# Patient Record
Sex: Female | Born: 1995 | Hispanic: Yes | Marital: Single | Smoking: Never smoker
Health system: Southern US, Community
[De-identification: ages and names within clinical notes are randomized; demographics above are authoritative.]

## PROBLEM LIST (undated history)

## (undated) ENCOUNTER — Emergency Department (HOSPITAL_COMMUNITY): Payer: Self-pay

## (undated) ENCOUNTER — Inpatient Hospital Stay (HOSPITAL_COMMUNITY): Payer: Self-pay

## (undated) DIAGNOSIS — K219 Gastro-esophageal reflux disease without esophagitis: Secondary | ICD-10-CM

## (undated) DIAGNOSIS — O24419 Gestational diabetes mellitus in pregnancy, unspecified control: Secondary | ICD-10-CM

## (undated) DIAGNOSIS — R519 Headache, unspecified: Secondary | ICD-10-CM

## (undated) DIAGNOSIS — Z789 Other specified health status: Secondary | ICD-10-CM

## (undated) HISTORY — PX: NO PAST SURGERIES: SHX2092

## (undated) HISTORY — PX: WISDOM TOOTH EXTRACTION: SHX21

---
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
## 2017-12-22 DIAGNOSIS — O020 Blighted ovum and nonhydatidiform mole: Secondary | ICD-10-CM

## 2017-12-22 HISTORY — DX: Blighted ovum and nonhydatidiform mole: O02.0

## 2017-12-26 ENCOUNTER — Inpatient Hospital Stay (HOSPITAL_COMMUNITY)
Admission: AD | Admit: 2017-12-26 | Discharge: 2017-12-27 | Disposition: A | Discharge status: Home / Self Care | Payer: Self-pay | Source: Ambulatory Visit | Attending: Obstetrics and Gynecology | Admitting: Obstetrics and Gynecology

## 2017-12-26 ENCOUNTER — Other Ambulatory Visit: Payer: Self-pay

## 2017-12-26 ENCOUNTER — Encounter (HOSPITAL_COMMUNITY): Payer: Self-pay | Admitting: *Deleted

## 2017-12-26 ENCOUNTER — Inpatient Hospital Stay (HOSPITAL_COMMUNITY)
Admission: AD | Admit: 2017-12-26 | Discharge: 2017-12-26 | Discharge status: Absent without leave | Payer: Self-pay | Attending: Obstetrics and Gynecology | Admitting: Obstetrics and Gynecology

## 2017-12-26 ENCOUNTER — Inpatient Hospital Stay (HOSPITAL_COMMUNITY): Payer: Self-pay

## 2017-12-26 DIAGNOSIS — O020 Blighted ovum and nonhydatidiform mole: Secondary | ICD-10-CM | POA: Insufficient documentation

## 2017-12-26 DIAGNOSIS — O34539 Maternal care for retroversion of gravid uterus, unspecified trimester: Secondary | ICD-10-CM | POA: Insufficient documentation

## 2017-12-26 DIAGNOSIS — O34599 Maternal care for other abnormalities of gravid uterus, unspecified trimester: Secondary | ICD-10-CM | POA: Insufficient documentation

## 2017-12-26 DIAGNOSIS — D4959 Neoplasm of unspecified behavior of other genitourinary organ: Secondary | ICD-10-CM | POA: Insufficient documentation

## 2017-12-26 DIAGNOSIS — O26891 Other specified pregnancy related conditions, first trimester: Secondary | ICD-10-CM | POA: Insufficient documentation

## 2017-12-26 DIAGNOSIS — O3680X0 Pregnancy with inconclusive fetal viability, not applicable or unspecified: Secondary | ICD-10-CM | POA: Insufficient documentation

## 2017-12-26 DIAGNOSIS — N854 Malposition of uterus: Secondary | ICD-10-CM | POA: Insufficient documentation

## 2017-12-26 DIAGNOSIS — R109 Unspecified abdominal pain: Secondary | ICD-10-CM | POA: Insufficient documentation

## 2017-12-26 HISTORY — DX: Other specified health status: Z78.9

## 2017-12-26 LAB — URINALYSIS, ROUTINE W REFLEX MICROSCOPIC
Bilirubin Urine: NEGATIVE
GLUCOSE, UA: NEGATIVE mg/dL
Hgb urine dipstick: NEGATIVE
KETONES UR: NEGATIVE mg/dL
Leukocytes, UA: NEGATIVE
NITRITE: NEGATIVE
PROTEIN: NEGATIVE mg/dL
Specific Gravity, Urine: 1.025 (ref 1.005–1.030)
pH: 5 (ref 5.0–8.0)

## 2017-12-26 LAB — POCT PREGNANCY, URINE: Preg Test, Ur: POSITIVE — AB

## 2017-12-26 NOTE — MAU Provider Note (Signed)
History   G1 @ 10 wks per LMP in with c/o abd pain. States was a pregnancy center yesterday and was told they could not see pregnancy or her uterus. Pt presents today with abd pain that is constant in nature and more to her left side. Denies vag bleeding.  CSN: [id number]  Arrival date & time 12/26/17  2014   None     Chief Complaint  Patient presents with  . Abdominal Pain    HPI  Past Medical History:  Diagnosis Date  . Medical history non-contributory     Past Surgical History:  Procedure Laterality Date  . NO PAST SURGERIES      No family history on file.  Social History   Tobacco Use  . Smoking status: Never Smoker  . Smokeless tobacco: Never Used  Substance Use Topics  . Alcohol use: No  . Drug use: No    OB History    Gravida Para Term Preterm AB Living   1             SAB TAB Ectopic Multiple Live Births                  Review of Systems  Constitutional: Negative.   HENT: Negative.   Eyes: Negative.   Respiratory: Negative.   Cardiovascular: Negative.   Gastrointestinal: Positive for abdominal pain.  Endocrine: Negative.   Genitourinary: Negative.   Musculoskeletal: Negative.   Skin: Negative.   Allergic/Immunologic: Negative.   Neurological: Negative.   Hematological: Negative.   Psychiatric/Behavioral: Negative.     Allergies  Patient has no known allergies.  Home Medications    BP 138/67 (BP Location: Right Arm)   Pulse 88   Temp 98.2 F (36.8 C)   Resp 18   Ht 5\' 1"  (1.549 m)   Wt 146 lb (66.2 kg)   LMP 10/11/2017   BMI 27.59 kg/m   Physical Exam  Constitutional: She is oriented to person, place, and time. She appears well-developed and well-nourished.  HENT:  Head: Normocephalic.  Eyes: Pupils are equal, round, and reactive to light.  Neck: Normal range of motion.  Cardiovascular: Normal rate, regular rhythm, normal heart sounds and intact distal pulses.  Pulmonary/Chest: Effort normal and breath sounds normal.   Abdominal: Soft. Bowel sounds are normal.  Genitourinary: Vagina normal and uterus normal.  Musculoskeletal: Normal range of motion.  Neurological: She is alert and oriented to person, place, and time. She has normal reflexes.  Skin: Skin is warm and dry.  Psychiatric: She has a normal mood and affect. Her behavior is normal. Judgment and thought content normal.    MAU Course  Procedures (including critical care time)  Labs Reviewed  URINALYSIS, ROUTINE W REFLEX MICROSCOPIC  HCG, QUANTITATIVE, PREGNANCY  POCT PREGNANCY, URINE   US Ob Comp Less 14 Wks  Result Date: 12/26/2017 CLINICAL DATA:  Pelvic pain. No gestational sac seen on outside ultrasound. Quantitative beta HCG is pending. EXAM: OBSTETRIC <14 WK [place] AND TRANSVAGINAL OB US TECHNIQUE: Both transabdominal and transvaginal ultrasound examinations were performed for complete evaluation of the gestation as well as the maternal uterus, adnexal regions, and pelvic cul-de-sac. Transvaginal technique was performed to assess early pregnancy. COMPARISON:  None. FINDINGS: Intrauterine gestational sac: None Yolk sac:  Not Visualized. Embryo:  Not Visualized. Cardiac Activity: Not Visualized. Maternal uterus/adnexae: Uterus is somewhat retroverted. Endometrium is expanded and there is heterogeneous multi cystic and hyperechoic uterine structure with peripheral flow. Appearances likely indicate molar pregnancy. Both ovaries  are demonstrated and are normal in appearance. Corpus luteal cyst on the left. No free fluid in the pelvis. IMPRESSION: A normal intrauterine pregnancy is not identified. The endometrium is expanded with heterogeneous multi-cystic in hyperechoic mass likely representing a molar pregnancy. Electronically Signed   By: Lucienne Capers M.D.   On: 12/26/2017 22:47   US Ob Transvaginal  Result Date: 12/26/2017 CLINICAL DATA:  Pelvic pain. No gestational sac seen on outside ultrasound. Quantitative beta HCG is pending. EXAM: OBSTETRIC  <14 WK [place] AND TRANSVAGINAL OB US TECHNIQUE: Both transabdominal and transvaginal ultrasound examinations were performed for complete evaluation of the gestation as well as the maternal uterus, adnexal regions, and pelvic cul-de-sac. Transvaginal technique was performed to assess early pregnancy. COMPARISON:  None. FINDINGS: Intrauterine gestational sac: None Yolk sac:  Not Visualized. Embryo:  Not Visualized. Cardiac Activity: Not Visualized. Maternal uterus/adnexae: Uterus is somewhat retroverted. Endometrium is expanded and there is heterogeneous multi cystic and hyperechoic uterine structure with peripheral flow. Appearances likely indicate molar pregnancy. Both ovaries are demonstrated and are normal in appearance. Corpus luteal cyst on the left. No free fluid in the pelvis. IMPRESSION: A normal intrauterine pregnancy is not identified. The endometrium is expanded with heterogeneous multi-cystic in hyperechoic mass likely representing a molar pregnancy. Electronically Signed   By: Lucienne Capers M.D.   On: 12/26/2017 22:47    Results for orders placed or performed during the hospital encounter of 12/26/17 (from the past 48 hour(s))  Urinalysis, Routine w reflex microscopic     Status: None   Collection Time: 12/26/17  8:40 PM  Result Value Ref Range   Color, Urine YELLOW YELLOW   APPearance CLEAR CLEAR   Specific Gravity, Urine 1.025 1.005 - 1.030   pH 5.0 5.0 - 8.0   Glucose, UA NEGATIVE NEGATIVE mg/dL   Hgb urine dipstick NEGATIVE NEGATIVE   Bilirubin Urine NEGATIVE NEGATIVE   Ketones, ur NEGATIVE NEGATIVE mg/dL   Protein, ur NEGATIVE NEGATIVE mg/dL   Nitrite NEGATIVE NEGATIVE   Leukocytes, UA NEGATIVE NEGATIVE  Pregnancy, urine POC     Status: Abnormal   Collection Time: 12/26/17  9:05 PM  Result Value Ref Range   Preg Test, Ur POSITIVE (A) NEGATIVE    Comment:        THE SENSITIVITY OF THIS METHODOLOGY IS >24 mIU/mL   hCG, quantitative, pregnancy     Status: Abnormal   Collection  Time: 12/26/17  9:06 PM  Result Value Ref Range   hCG, Beta Chain, Quant, S 416,946 (H) <5 mIU/mL    Comment:          GEST. AGE      CONC.  (mIU/mL)   <=1 WEEK        5 - 50     2 WEEKS       50 - 500     3 WEEKS       100 - 10,000     4 WEEKS     1,000 - 30,000     5 WEEKS     3,500 - 115,000   6-8 WEEKS     12,000 - 270,000    12 WEEKS     15,000 - 220,000        FEMALE AND NON-PREGNANT FEMALE:     LESS THAN 5 mIU/mL RESULTS CONFIRMED BY MANUAL DILUTION     MDM  VSS, abd soft non tender, no abnormal discharge, no vag bleeding. POC preg test pos.   Received report  from Daiva Nakayama @ 9:30 PM Patient in [place] Discussed [place] in detail with patient and significant other. Patient speaks english, however interpretor used for significant other. Discussed [place] with Dr. Rip Harbour.    A:  1. Abdominal pain in pregnancy, first trimester   2. Molar pregnancy     P:  Discharge home with strict return precautions Rx: Zofran [facility] will call you to schedule D&C Return to MAU if symptoms worsen Support given  Noni Saupe I, NP 12/28/2017 1:58 PM

## 2017-12-26 NOTE — MAU Note (Signed)
Had positive upt few wks ago. Went to [facility] yest for u/s and could not see gest sac. Have some pain in lower abd today with yellow/pink vag d/c

## 2017-12-26 NOTE — MAU Note (Signed)
Noni Saupe NP in with [employer] interpreter to discuss u/s results and plan of care with pt

## 2017-12-27 DIAGNOSIS — O020 Blighted ovum and nonhydatidiform mole: Secondary | ICD-10-CM

## 2017-12-27 LAB — HCG, QUANTITATIVE, PREGNANCY: hCG, Beta Chain, Quant, S: 416946 m[IU]/mL — ABNORMAL HIGH (ref <5)

## 2017-12-27 MED ORDER — ONDANSETRON 4 MG PO TBDP: 4.0000 mg | ORAL_TABLET | Freq: Three times a day (TID) | ORAL | 0 refills | Status: DC | PRN

## 2017-12-27 NOTE — Progress Notes (Signed)
J Rasch NP in earlier to discuss plan of care. Written and verbal d/c instructions given and understanding voiced

## 2017-12-27 NOTE — Discharge Instructions (Signed)
Embarazo molar (Molar Pregnancy) Un embarazo molar (mola hidatiforme) es una masa de tejido que crece en el tero despus de una concepcin. La masa est formada por un vulo que no fue fertilizado correctamente y crece de forma anormal. Es un embarazo anormal y no llega a ser un feto. Si el mdico sospecha la existencia de Nutritional therapist molar, se Radiation protection practitioner. CAUSAS [employer] molar se da cuando un vulo se fecunda incorrectamente, de modo que tiene material gentico anormal (cromosomas). Esto puede resultar en uno de 2 tipos de Media planner molar:  Media planner molar completo: todos los cromosomas en el vulo fecundado provienen del padre; ninguno proviene de la [place].  Embarazo molar parcial: el vulo fecundado tiene cromosomas del padre y de la [place], [place] estos son demasiados. FACTORES DE RIESGO Ciertos factores de riesgo aumentan la probabilidad de que ocurra un embarazo molar. Ellos son:  Ser mayor de 68 o menor de 21aos.  Historia de Nutritional therapist molar en el pasado (muy escasa probabilidad de recurrencia). Otros factores de riesgo posibles son:  Fumar ms de 15 cigarrillos por Training and development officer.  Historia de infertilidad.  Tener cierto tipo de sangre (A, B, AB).  Tener dficit de vitamina A.  El uso de anticonceptivos orales. SIGNOS Y SNTOMAS  Hemorragia vaginal.  Falta del periodo menstrual.  El tero crece ms rpido de lo normal.  Nuseas y vmitos intensos.  Presin o Stage manager.  Quistes ovricos anormales (quistes tecalutenicos).  Secrecin vaginal similar a uvas.  Presin arterial alta (inicio temprano de preeclampsia).  Hiperactividad tiroidea (hipertiroidismo).  Anemia.  DIAGNSTICO Si el mdico cree que existe la posibilidad de un Media planner molar, le indicar algunos estudios. [place] los siguientes:  Earl Lagos.  Anlisis de [place]. TRATAMIENTO La mayora de los embarazos molares finalizan de manera  espontnea en un aborto. Sin embargo, el mdico tiene que asegurarse de que no haya quedado tejido anormal dentro del tero. Esto se puede Designer, multimedia dilatacin y curetaje (D y C) o legrado por aspiracin. En este procedimiento, se extirpa todo el tejido molar restante a travs de la vagina. Despus del diagnstico de Nutritional therapist molar, deben controlarse los niveles de hormona del embarazo hasta que el nivel sea cero. Si el nivel de hormona del embarazo no cae apropiadamente, ser Chartered loss adjuster un tratamiento de quimioterapia. Adems, tambin recibir un medicamento llamado inmunoglobulina Rho (D) si su tipo de sangre es Rh negativo y el de su pareja sexual es Rh positivo. Esto ayuda a prevenir problemas con el factor Rh en prximos embarazos. INSTRUCCIONES PARA EL CUIDADO EN EL HOGAR  Evite quedar embarazada durante 6 a 53meses o segn las indicaciones del mdico. Use un mtodo anticonceptivo confiable o no tenga relaciones sexuales.  Tome slo medicamentos de venta libre o recetados, segn las indicaciones del mdico.  Asista a todas las visitas de seguimiento y Insurance account manager todos los anlisis de laboratorio y las ecografas sugeridas.  Vuelva poco a poco a sus actividades normales.  Considere participar en un grupo de apoyo. Pida ayuda si usted tiene dificultad para elaborar el duelo.  Esta informacin no tiene Marine scientist el consejo del mdico. Asegrese de hacerle al mdico cualquier pregunta que tenga. Document Released: 11/27/2011 Document Revised: 09/28/2013 Document Reviewed: 07/07/2013 Elsevier Interactive Patient Education  2017 [place] Pregnancy A molar pregnancy (hydatidiform mole) is a mass of tissue that grows in the uterus after conception. The mass is created by an egg that was not  fertilized correctly and abnormally grows. It is an abnormal pregnancy and does not develop into a fetus. If a molar pregnancy is suspected by your health care provider,  treatment is required. What are the causes? Molar pregnancy is caused by an egg that is fertilized incorrectly so that it has abnormal genetic material (chromosomes). This can result in one of 2 types of molar pregnancy:  Complete molar pregnancy--All of the chromosomes in the fertilized egg come from the father; none come from the mother.  Partial molar pregnancy--The fertilized egg has chromosomes from the father and mother, but it has too many chromosomes.  What increases the risk? Certain risk factors make a molar pregnancy more likely. They include:  Being over age 66 or under age 55.  History of a molar pregnancy in the past (extremely small chance of recurrence).  Other possible risk factors include:  Smoking more than 15 cigarettes per day.  History of infertility.  Having a certain blood type (A, B, AB).  Having a vitamin A deficiency.  Using oral contraceptives.  What are the signs or symptoms?  Vaginal bleeding.  Missed menstrual period.  Uterus grows quicker than normal.  Severe nausea and vomiting.  Severe pressure or pain in the uterus.  Abnormal ovarian cysts (theca lutein cysts).  Discharge from the vagina that looks like grapes.  High blood pressure (early onset of preeclampsia).  Overactive thyroid (hyperthyroidism).  Anemia. How is this diagnosed? If your health care provider thinks there is a chance of a molar pregnancy, testing will be recommended. Possible tests include:  An ultrasound test.  Blood tests.  How is this treated? Most molar pregnancies end on their own by miscarriage. However, a health care provider needs to make sure that all the abnormal tissue is out of the womb. This can be done with dilation and curettage (D&C) or suction curettage. In this procedure, any remaining molar tissue is removed through the vagina. After diagnosis of a molar pregnancy, the pregnancy hormone levels must be followed until the level is zero. If  the pregnancy hormone level does not drop appropriately, chemotherapy may be necessary. Also, you will be given a medicine called Rho (D) immune globulin if you are Rh negative and your sex partner is Rh positive. This helps prevent Rh problems in future pregnancies. Follow these instructions at home:  Avoid getting pregnant for 6-12 months or as directed by your health care provider. Use a reliable form of birth control or do not have sex.  Only take over-the-counter or prescription medicine as directed by your health care provider.  Keep all follow-up appointments and get all suggested lab tests and ultrasound tests.  Gradually return to normal activities.  Think about joining a support group. Ask for help if you are struggling with grief. This information is not intended to replace advice given to you by your health care provider. Make sure you discuss any questions you have with your health care provider. Document Released: 08/26/2011 Document Revised: 05/15/2016 Document Reviewed: 07/07/2013 Elsevier Interactive Patient Education  2017 [employer].

## 2017-12-28 ENCOUNTER — Telehealth (HOSPITAL_COMMUNITY): Payer: Self-pay

## 2017-12-28 ENCOUNTER — Telehealth: Payer: Self-pay | Admitting: General Practice

## 2017-12-28 ENCOUNTER — Other Ambulatory Visit: Payer: Self-pay

## 2017-12-28 ENCOUNTER — Encounter (HOSPITAL_COMMUNITY): Payer: Self-pay

## 2017-12-28 NOTE — Telephone Encounter (Signed)
-----   Message from Lezlie Lye, NP sent at 12/28/2017  1:54 PM EST ----- Regarding: schedule D&C This patient has a molar pregnancy and needs to be scheduled for a D&C. Thank you!   Anderson Malta

## 2017-12-28 NOTE — Telephone Encounter (Signed)
Thayer Headings from [facility] called and left message on nurse line stating patient was seen 12/20 and had a positive pregnancy test. Patient returned 1/4 for ultrasound, would be 8 weeks by LMP. Ultrasound shows nothing. Patient denies bleeding or pain & was given ectopic precautions. They are calling for follow up ultrasound appt for patient. Per chart review, patient was seen in MAU on 1/5. Will route message

## 2017-12-28 NOTE — Telephone Encounter (Signed)
Called and spoke with the patient, given surgery date and time. Advised pt NPO after midnight, arrive @[facility]  at least a hour and a half before your surgery (be here around 12:30p). No lotions, powder, or perfume, a little bit of deodorants is okay. Remove all jewelery. Pt expressed understanding. Advised her she should receive a call from Pre-Op either today or tomorrow.

## 2017-12-30 ENCOUNTER — Ambulatory Visit (HOSPITAL_COMMUNITY)
Admission: AD | Admit: 2017-12-30 | Discharge: 2017-12-30 | Disposition: A | Discharge status: Home / Self Care | Payer: Self-pay | Source: Ambulatory Visit | Attending: Obstetrics and Gynecology | Admitting: Obstetrics and Gynecology

## 2017-12-30 ENCOUNTER — Ambulatory Visit (HOSPITAL_COMMUNITY): Payer: Self-pay | Admitting: Anesthesiology

## 2017-12-30 ENCOUNTER — Other Ambulatory Visit: Payer: Self-pay

## 2017-12-30 ENCOUNTER — Encounter (HOSPITAL_COMMUNITY)
Admission: AD | Disposition: A | Discharge status: Home / Self Care | Payer: Self-pay | Source: Ambulatory Visit | Attending: Obstetrics and Gynecology

## 2017-12-30 ENCOUNTER — Encounter (HOSPITAL_COMMUNITY): Payer: Self-pay

## 2017-12-30 ENCOUNTER — Ambulatory Visit (HOSPITAL_COMMUNITY): Payer: Self-pay

## 2017-12-30 DIAGNOSIS — K219 Gastro-esophageal reflux disease without esophagitis: Secondary | ICD-10-CM | POA: Insufficient documentation

## 2017-12-30 DIAGNOSIS — Z09 Encounter for follow-up examination after completed treatment for conditions other than malignant neoplasm: Secondary | ICD-10-CM

## 2017-12-30 DIAGNOSIS — O020 Blighted ovum and nonhydatidiform mole: Secondary | ICD-10-CM

## 2017-12-30 HISTORY — DX: Gastro-esophageal reflux disease without esophagitis: K21.9

## 2017-12-30 HISTORY — PX: DILATION AND EVACUATION: SHX1459

## 2017-12-30 LAB — BASIC METABOLIC PANEL
Anion gap: 9 (ref 5–15)
BUN: 9 mg/dL (ref 6–20)
CALCIUM: 9.1 mg/dL (ref 8.9–10.3)
CO2: 21 mmol/L — ABNORMAL LOW (ref 22–32)
CREATININE: 0.41 mg/dL — AB (ref 0.44–1.00)
Chloride: 105 mmol/L (ref 101–111)
Glucose, Bld: 85 mg/dL (ref 65–99)
Potassium: 4 mmol/L (ref 3.5–5.1)
SODIUM: 135 mmol/L (ref 135–145)

## 2017-12-30 LAB — ABO/RH: ABO/RH(D): A POS

## 2017-12-30 LAB — CBC
HCT: 38.9 % (ref 36.0–46.0)
Hemoglobin: 13.2 g/dL (ref 12.0–15.0)
MCH: 30.5 pg (ref 26.0–34.0)
MCHC: 33.9 g/dL (ref 30.0–36.0)
MCV: 89.8 fL (ref 78.0–100.0)
PLATELETS: 255 10*3/uL (ref 150–400)
RBC: 4.33 MIL/uL (ref 3.87–5.11)
RDW: 12.9 % (ref 11.5–15.5)
WBC: 8.2 10*3/uL (ref 4.0–10.5)

## 2017-12-30 LAB — TYPE AND SCREEN
ABO/RH(D): A POS
ANTIBODY SCREEN: NEGATIVE

## 2017-12-30 SURGERY — DILATION AND EVACUATION, UTERUS
Anesthesia: Monitor Anesthesia Care

## 2017-12-30 MED ORDER — KETOROLAC TROMETHAMINE 30 MG/ML IJ SOLN: 30.0000 mg | Freq: Once | INTRAMUSCULAR | Status: DC

## 2017-12-30 MED ORDER — PROPOFOL 10 MG/ML IV BOLUS
INTRAVENOUS | Status: AC
  Filled 2017-12-30: qty 40

## 2017-12-30 MED ORDER — FENTANYL CITRATE (PF) 100 MCG/2ML IJ SOLN
INTRAMUSCULAR | Status: AC
  Filled 2017-12-30: qty 2

## 2017-12-30 MED ORDER — DEXAMETHASONE SODIUM PHOSPHATE 10 MG/ML IJ SOLN
INTRAMUSCULAR | Status: DC | PRN
  Administered 2017-12-30: 4 mg via INTRAVENOUS

## 2017-12-30 MED ORDER — FENTANYL CITRATE (PF) 100 MCG/2ML IJ SOLN: 25.0000 ug | INTRAMUSCULAR | Status: DC | PRN

## 2017-12-30 MED ORDER — LIDOCAINE HCL (CARDIAC) 20 MG/ML IV SOLN
INTRAVENOUS | Status: DC | PRN
  Administered 2017-12-30: 60 mg via INTRAVENOUS

## 2017-12-30 MED ORDER — SCOPOLAMINE 1 MG/3DAYS TD PT72
MEDICATED_PATCH | TRANSDERMAL | Status: AC
  Administered 2017-12-30: 1.5 mg via TRANSDERMAL
  Filled 2017-12-30: qty 1

## 2017-12-30 MED ORDER — ONDANSETRON HCL 4 MG/2ML IJ SOLN
INTRAMUSCULAR | Status: DC | PRN
  Administered 2017-12-30: 4 mg via INTRAVENOUS

## 2017-12-30 MED ORDER — FENTANYL CITRATE (PF) 100 MCG/2ML IJ SOLN
INTRAMUSCULAR | Status: DC | PRN
  Administered 2017-12-30 (×4): 25 ug via INTRAVENOUS

## 2017-12-30 MED ORDER — PROPOFOL 500 MG/50ML IV EMUL
INTRAVENOUS | Status: DC | PRN
  Administered 2017-12-30: 150 ug/kg/min via INTRAVENOUS

## 2017-12-30 MED ORDER — DEXAMETHASONE SODIUM PHOSPHATE 4 MG/ML IJ SOLN
INTRAMUSCULAR | Status: AC
  Filled 2017-12-30: qty 1

## 2017-12-30 MED ORDER — OXYCODONE HCL 5 MG PO TABS: 5.0000 mg | ORAL_TABLET | Freq: Once | ORAL | Status: DC | PRN

## 2017-12-30 MED ORDER — PROMETHAZINE HCL 25 MG/ML IJ SOLN: 6.2500 mg | INTRAMUSCULAR | Status: DC | PRN

## 2017-12-30 MED ORDER — PROPOFOL 10 MG/ML IV BOLUS
INTRAVENOUS | Status: DC | PRN
  Administered 2017-12-30 (×2): 20 mg via INTRAVENOUS

## 2017-12-30 MED ORDER — ONDANSETRON HCL 4 MG/2ML IJ SOLN: 4.0000 mg | Freq: Four times a day (QID) | INTRAMUSCULAR | Status: DC | PRN

## 2017-12-30 MED ORDER — ONDANSETRON HCL 4 MG/2ML IJ SOLN
INTRAMUSCULAR | Status: AC
  Filled 2017-12-30: qty 2

## 2017-12-30 MED ORDER — DOXYCYCLINE HYCLATE 100 MG IV SOLR
200.0000 mg | INTRAVENOUS | Status: AC
  Administered 2017-12-30: 200 mg via INTRAVENOUS
  Filled 2017-12-30: qty 200

## 2017-12-30 MED ORDER — LACTATED RINGERS IV SOLN
INTRAVENOUS | Status: DC
  Administered 2017-12-30: 100 mL/h via INTRAVENOUS

## 2017-12-30 MED ORDER — IBUPROFEN 600 MG PO TABS: 600.0000 mg | ORAL_TABLET | Freq: Four times a day (QID) | ORAL | 0 refills | Status: DC | PRN

## 2017-12-30 MED ORDER — DOCUSATE SODIUM 100 MG PO CAPS: 100.0000 mg | ORAL_CAPSULE | Freq: Two times a day (BID) | ORAL | 2 refills | Status: DC | PRN

## 2017-12-30 MED ORDER — IBUPROFEN 600 MG PO TABS: 600.0000 mg | ORAL_TABLET | Freq: Four times a day (QID) | ORAL | Status: DC | PRN

## 2017-12-30 MED ORDER — PROPOFOL 10 MG/ML IV BOLUS
INTRAVENOUS | Status: AC
  Filled 2017-12-30: qty 20

## 2017-12-30 MED ORDER — OXYCODONE HCL 5 MG/5ML PO SOLN: 5.0000 mg | Freq: Once | ORAL | Status: DC | PRN

## 2017-12-30 MED ORDER — OXYCODONE-ACETAMINOPHEN 5-325 MG PO TABS: 1.0000 | ORAL_TABLET | ORAL | Status: DC | PRN

## 2017-12-30 MED ORDER — KETOROLAC TROMETHAMINE 30 MG/ML IJ SOLN
INTRAMUSCULAR | Status: DC | PRN
  Administered 2017-12-30: 30 mg via INTRAVENOUS

## 2017-12-30 MED ORDER — SCOPOLAMINE 1 MG/3DAYS TD PT72
1.0000 | MEDICATED_PATCH | Freq: Once | TRANSDERMAL | Status: DC
  Administered 2017-12-30: 1.5 mg via TRANSDERMAL

## 2017-12-30 MED ORDER — MIDAZOLAM HCL 2 MG/2ML IJ SOLN
INTRAMUSCULAR | Status: AC
  Filled 2017-12-30: qty 2

## 2017-12-30 MED ORDER — MIDAZOLAM HCL 2 MG/2ML IJ SOLN
INTRAMUSCULAR | Status: DC | PRN
  Administered 2017-12-30: 2 mg via INTRAVENOUS

## 2017-12-30 MED ORDER — ONDANSETRON HCL 4 MG PO TABS: 4.0000 mg | ORAL_TABLET | Freq: Four times a day (QID) | ORAL | Status: DC | PRN

## 2017-12-30 MED ORDER — LACTATED RINGERS IV SOLN: INTRAVENOUS | Status: DC

## 2017-12-30 SURGICAL SUPPLY — 19 items
CATH ROBINSON RED A/P 16FR (CATHETERS) ×2 IMPLANT
DECANTER SPIKE VIAL GLASS SM (MISCELLANEOUS) ×2 IMPLANT
GLOVE BIO SURGEON STRL SZ 6.5 (GLOVE) ×2 IMPLANT
GLOVE BIOGEL PI IND STRL 6.5 (GLOVE) ×1 IMPLANT
GLOVE BIOGEL PI IND STRL 7.0 (GLOVE) ×1 IMPLANT
GLOVE BIOGEL PI INDICATOR 6.5 (GLOVE) ×1
GLOVE BIOGEL PI INDICATOR 7.0 (GLOVE) ×1
GOWN STRL REUS W/TWL LRG LVL3 (GOWN DISPOSABLE) ×4 IMPLANT
KIT BERKELEY 1ST TRIMESTER 3/8 (MISCELLANEOUS) ×2 IMPLANT
NS IRRIG 1000ML POUR BTL (IV SOLUTION) ×2 IMPLANT
PACK VAGINAL MINOR WOMEN LF (CUSTOM PROCEDURE TRAY) ×2 IMPLANT
PAD OB MATERNITY 4.3X12.25 (PERSONAL CARE ITEMS) ×2 IMPLANT
PAD PREP 24X48 CUFFED NSTRL (MISCELLANEOUS) ×2 IMPLANT
SET BERKELEY SUCTION TUBING (SUCTIONS) ×2 IMPLANT
TOWEL OR 17X24 6PK STRL BLUE (TOWEL DISPOSABLE) ×4 IMPLANT
VACURETTE 10 RIGID CVD (CANNULA) IMPLANT
VACURETTE 7MM CVD STRL WRAP (CANNULA) IMPLANT
VACURETTE 8 RIGID CVD (CANNULA) ×2 IMPLANT
VACURETTE 9 RIGID CVD (CANNULA) IMPLANT

## 2017-12-30 NOTE — Op Note (Signed)
Erica Hall PROCEDURE DATE:  12/30/2017  PREOPERATIVE DIAGNOSIS: molar pregnancy @ [redacted]w[redacted]d POSTOPERATIVE DIAGNOSIS: The same PROCEDURE:  Dilation and curettage under ultrasound guidance SURGEON:  Dr. Feliz Beam  INDICATIONS: 22 y.o.  G1P0 presenting with suspected molar pregnancy, recommended for surgical management.  Risks of surgery were discussed with the patient and her friend including but not limited to: bleeding which may require transfusion; infection which may require antibiotics; injury to uterus or surrounding organs; need for additional procedures including laparotomy or laparoscopy; possibility of intrauterine scarring which may impair future fertility; and other postoperative/anesthesia complications. Written informed consent was obtained.  FINDINGS:   Significant amount of products of conception within uterus. Empty endometrial stripe noted on ultrasound at the end of the procedure.   ANESTHESIA:    Monitored intravenous sedation INTRAVENOUS FLUIDS:  600 ml of LR ESTIMATED BLOOD LOSS:  100 ml. SPECIMENS:  Products of conception sent to pathology COMPLICATIONS:  None immediate.  PROCEDURE DETAILS:  The patient received intravenous Doxycycline while in the preoperative area.  She was then taken to the operating room whereanesthesia  was administered and was found to be adequate.  After an adequate timeout was performed, she was placed in the dorsal lithotomy position and examined; then prepped and draped in the sterile manner.   Her bladder was catheterized for an unmeasured amount of clear, yellow urine. A vaginal speculum was then placed in the patient's vagina and a GC/CT swab was taken and sent to the lab. A single tooth tenaculum was applied to the anterior lip of the cervix.  The cervix was gently dilated under ultrasound guidance to accommodate a 8 mm suction curette that was gently advanced to the uterine fundus.  The suction device was then activated and curette  slowly rotated to clear the uterus of products of conception.  This was repeated until the endometrial cavity was cleared and a clean stripe was noted on ultrasound. A sharp curettage was then performed to confirm complete emptying of the uterus. There was an empty endometrial stripe noted on the ultrasound at the end of the curettage. There was minimal bleeding noted at the end of the procedure, and the tenaculum removed with good hemostasis noted.     All instruments were removed from the patient's vagina.  Sponge and instrument counts were correct times three.  The patient tolerated the procedure well and was taken to the recovery area awake, extubated and in stable condition.  The patient will be discharged to home as per PACU criteria.  Routine postoperative instructions given.  She was prescribed Percocet, Ibuprofen.  She will follow up in the clinic in 2-3 weeks for postoperative evaluation.   Feliz Beam, M.D. Attending [place], [facility] for [employer], [place]

## 2017-12-30 NOTE — Transfer of Care (Signed)
Immediate Anesthesia Transfer of Care Note  Patient: Erica Hall  Procedure(s) Performed: DILATATION AND EVACUATION (N/A )  Patient Location: PACU  Anesthesia Type:MAC  Level of Consciousness: awake, alert , oriented, drowsy and patient cooperative  Airway & Oxygen Therapy: Patient Spontanous Breathing and Patient connected to nasal cannula oxygen  Post-op Assessment: Report given to RN and Post -op Vital signs reviewed and stable  Post vital signs: Reviewed and stable  Last Vitals:  Vitals:   12/30/17 1256  BP: 120/78  Pulse: 87  Temp: 37.2 C  SpO2: 98%    Last Pain:  Vitals:   12/30/17 1256  TempSrc: Oral  PainSc: 3       Patients Stated Pain Goal: 3 (60/04/59 9774)  Complications: No apparent anesthesia complications

## 2017-12-30 NOTE — Discharge Instructions (Signed)
DISCHARGE INSTRUCTIONS: D&C / D&E The following instructions have been prepared to help you care for yourself upon your return home.   Personal hygiene:  Use sanitary pads for vaginal drainage, not tampons.  Shower the day after your procedure.  NO tub baths, pools or Jacuzzis for 2-3 weeks.  Wipe front to back after using the bathroom.  Activity and limitations:  Do NOT drive or operate any equipment for 24 hours. The effects of anesthesia are still present and drowsiness may result.  Do NOT rest in bed all day.  Walking is encouraged.  Walk up and down stairs slowly.  You may resume your normal activity in one to two days or as indicated by your physician.  Sexual activity: NO intercourse for at least 2 weeks after the procedure, or as indicated by your physician.  Diet: Eat a light meal as desired this evening. You may resume your usual diet tomorrow.  Return to work: You may resume your work activities in one to two days or as indicated by your doctor.  What to expect after your surgery: Expect to have vaginal bleeding/discharge for 2-3 days and spotting for up to 10 days. It is not unusual to have soreness for up to 1-2 weeks. You may have a slight burning sensation when you urinate for the first day. Mild cramps may continue for a couple of days. You may have a regular period in 2-6 weeks.  Call your doctor for any of the following:  Excessive vaginal bleeding, saturating and changing one pad every hour.  Inability to urinate 6 hours after discharge from hospital.  Pain not relieved by pain medication.  Fever of 100.4 F or greater.  Unusual vaginal discharge or odor.   Call for an appointment:    Patients signature: ______________________  Nurses signature ________________________  Support person's signature_______________________   Stop taking all medication that has folic acid in it. This includes most types of prenatal vitamins.  Dilation and  Curettage or Vacuum Curettage, Care After These instructions give you information about caring for yourself after your procedure. Your doctor may also give you more specific instructions. Call your doctor if you have any problems or questions after your procedure. Follow these instructions at home: Activity  Do not drive or use heavy machinery while taking prescription pain medicine.  For 24 hours after your procedure, avoid driving.  Take short walks often, followed by rest periods. Ask your doctor what activities are safe for you. After one or two days, you may be able to return to your normal activities.  Do not lift anything that is heavier than 10 lb (4.5 kg) until your doctor approves.  For at least 2 weeks, or as long as told by your doctor: ? Do not douche. ? Do not use tampons. ? Do not have sex. General instructions  Take over-the-counter and prescription medicines only as told by your doctor. This is very important if you take blood thinning medicine.  Do not take baths, swim, or use a hot tub until your doctor approves. Take showers instead of baths.  Wear compression stockings as told by your doctor.  It is up to you to get the results of your procedure. Ask your doctor when your results will be ready.  Keep all follow-up visits as told by your doctor. This is important. Contact a doctor if:  You have very bad cramps that get worse or do not get better with medicine.  You have very bad pain in  your belly (abdomen).  You cannot drink fluids without throwing up (vomiting).  You get pain in a different part of the area between your belly and thighs (pelvis).  You have bad-smelling discharge from your vagina.  You have a rash. Get help right away if:  You are bleeding a lot from your vagina. A lot of bleeding means soaking more than one sanitary pad in an hour, for 2 hours in a row.  You have clumps of blood (blood clots) coming from your vagina.  You have a  fever or chills.  Your belly feels very tender or hard.  You have chest pain.  You have trouble breathing.  You cough up blood.  You feel dizzy.  You feel light-headed.  You pass out (faint).  You have pain in your neck or shoulder area. Summary  Take short walks often, followed by rest periods. Ask your doctor what activities are safe for you. After one or two days, you may be able to return to your normal activities.  Do not lift anything that is heavier than 10 lb (4.5 kg) until your doctor approves.  Do not take baths, swim, or use a hot tub until your doctor approves. Take showers instead of baths.  Contact your doctor if you have any symptoms of infection, like bad-smelling discharge from your vagina. This information is not intended to replace advice given to you by your health care provider. Make sure you discuss any questions you have with your health care provider. Document Released: 09/16/2008 Document Revised: 08/25/2016 Document Reviewed: 08/25/2016 Elsevier Interactive Patient Education  2017 [employer].

## 2017-12-30 NOTE — Anesthesia Postprocedure Evaluation (Signed)
Anesthesia Post Note  Patient: Erica Hall  Procedure(s) Performed: DILATATION AND EVACUATION (N/A )     Patient location during evaluation: PACU Anesthesia Type: MAC Level of consciousness: awake and alert Pain management: pain level controlled Vital Signs Assessment: post-procedure vital signs reviewed and stable Respiratory status: spontaneous breathing, nonlabored ventilation, respiratory function stable and patient connected to nasal cannula oxygen Cardiovascular status: stable and blood pressure returned to baseline Postop Assessment: no apparent nausea or vomiting Anesthetic complications: no    Last Vitals:  Vitals:   12/30/17 1544 12/30/17 1545  BP:    Pulse: 70 69  Resp: 18 17  Temp: 37.2 C   SpO2: 100% 100%    Last Pain:  Vitals:   12/30/17 1256  TempSrc: Oral  PainSc: 3    Pain Goal: Patients Stated Pain Goal: 3 (12/30/17 1256)               Audry Pili

## 2017-12-30 NOTE — H&P (Signed)
OB/GYN History and Physical  Erica Hall is a 22 y.o. G1P0 presenting for surgical management of suspected molar pregnancy. She was seen in early pregnancy at center and noted to have abnormal appearing intrauterine tissue. Sent to MAU for further eval and noted to have intrauterine tissue with appearance of molar pregnancy. Also noted abdominal pain on left, corpus luteum noted on [place].      Past Medical History:  Diagnosis Date  . GERD (gastroesophageal reflux disease)    occasional -diet controlled, no meds  . Medical history non-contributory    Past Surgical History:  Procedure Laterality Date  . NO PAST SURGERIES     OB History  Gravida Para Term Preterm AB Living  1            SAB TAB Ectopic Multiple Live Births               # Outcome Date GA Lbr Len/2nd Weight Sex Delivery Anes PTL Lv  1 Current               Social History   Socioeconomic History  . Marital status: Single    Spouse name: None  . Number of children: None  . Years of education: None  . Highest education level: None  Social Needs  . Financial resource strain: None  . Food insecurity - worry: None  . Food insecurity - inability: None  . Transportation needs - medical: None  . Transportation needs - non-medical: None  Occupational History  . None  Tobacco Use  . Smoking status: Never Smoker  . Smokeless tobacco: Never Used  Substance and Sexual Activity  . Alcohol use: No  . Drug use: No  . Sexual activity: Yes    Comment: approx [redacted] wks gestation - 1st pregnancy  Other Topics Concern  . None  Social History Narrative  . None   History reviewed. No pertinent family history.  Medications Prior to Admission  Medication Sig Dispense Refill Last Dose  . ondansetron (ZOFRAN ODT) 4 MG disintegrating tablet Take 1 tablet (4 mg total) by mouth every 8 (eight) hours as needed for nausea or vomiting. 20 tablet 0 Past Week at Unknown time  . Prenatal Vit-Fe Fumarate-FA (PRENATAL  MULTIVITAMIN) TABS tablet Take 1 tablet by mouth daily at 12 noon.   Past Week at Unknown time   No Known Allergies  Review of Systems: Negative except for what is mentioned in HPI.     Physical Exam: BP 120/78   Pulse 87   Temp 99 F (37.2 C) (Oral)   Ht 5\' 1"  (1.549 m)   Wt 146 lb (66.2 kg)   LMP 10/11/2017 (Exact Date) Comment: approx [redacted] wks gestation  SpO2 98%   BMI 27.59 kg/m  CONSTITUTIONAL: Well-developed, well-nourished female in no acute distress.  HENT:  Normocephalic, atraumatic, External right and left ear normal. Oropharynx is clear and moist EYES: Conjunctivae and EOM are normal. Pupils are equal, round, and reactive to light. No scleral icterus.  NECK: Normal range of motion, supple, no masses SKIN: Skin is warm and dry. No rash noted. Not diaphoretic. No erythema. No pallor. [place]: Alert and oriented to person, place, and time. Normal reflexes, muscle tone coordination. No cranial nerve deficit noted. PSYCHIATRIC: Normal mood and affect. Normal behavior. Normal judgment and thought content. CARDIOVASCULAR: Normal heart rate noted, regular rhythm RESPIRATORY: Effort and breath sounds normal, no problems with respiration noted ABDOMEN: Soft, nontender, nondistended, gravid.  PELVIC: Deferred MUSCULOSKELETAL: Normal range  of motion. No edema and no tenderness. 2+ distal pulses.   Pertinent Labs/Studies:   Results for orders placed or performed during the hospital encounter of 12/30/17 (from the past 72 hour(s))  Type and screen     Status: None (Preliminary result)   Collection Time: 12/30/17 12:40 PM  Result Value Ref Range   ABO/RH(D) A POS    Antibody Screen PENDING    Sample Expiration 01/02/2018   CBC     Status: None   Collection Time: 12/30/17 12:40 PM  Result Value Ref Range   WBC 8.2 4.0 - 10.5 K/uL   RBC 4.33 3.87 - 5.11 MIL/uL   Hemoglobin 13.2 12.0 - 15.0 g/dL   HCT 38.9 36.0 - 46.0 %   MCV 89.8 78.0 - 100.0 fL   MCH 30.5 26.0 - 34.0 pg    MCHC 33.9 30.0 - 36.0 g/dL   RDW 12.9 11.5 - 15.5 %   Platelets 255 150 - 400 K/uL   HCG 12/26/17: 416,000  CLINICAL DATA:  Pelvic pain. No gestational sac seen on outside ultrasound. Quantitative beta HCG is pending.  EXAM: OBSTETRIC <14 WK [place] AND TRANSVAGINAL OB US  TECHNIQUE: Both transabdominal and transvaginal ultrasound examinations were performed for complete evaluation of the gestation as well as the maternal uterus, adnexal regions, and pelvic cul-de-sac. Transvaginal technique was performed to assess early pregnancy.  COMPARISON:  None.  FINDINGS: Intrauterine gestational sac: None  Yolk sac:  Not Visualized.  Embryo:  Not Visualized.  Cardiac Activity: Not Visualized.  Maternal uterus/adnexae: Uterus is somewhat retroverted. Endometrium is expanded and there is heterogeneous multi cystic and hyperechoic uterine structure with peripheral flow. Appearances likely indicate molar pregnancy. Both ovaries are demonstrated and are normal in appearance. Corpus luteal cyst on the left. No free fluid in the pelvis.  IMPRESSION: A normal intrauterine pregnancy is not identified. The endometrium is expanded with heterogeneous multi-cystic in hyperechoic mass likely representing a molar pregnancy.   Electronically Signed   By: Lucienne Capers M.D.   On: 12/26/2017 22:47     Assessment and Plan :Erica Hall is a 22 y.o. G1P0 admitted for D&E for suspected molar pregnancy. She is without complaint today. Reviewed recommendation for D&E to fully evacuate uterus and obtain pathology specimen. Briefly reviewed need for close follow up if path is positive for molar pregnancy, she verbalizes understanding.  The risks of suction D&C were reviewed with the patient; including but not limited to: infection which may require antibiotics; bleeding which may require transfusion or re-operation; injury to bowel, bladder, ureters or other surrounding organs; need  for additional procedures including hysterectomy in the event of a life-threatening hemorrhage; placental abnormalities wth subsequent pregnancies, thromboembolic phenomenon and other postoperative/anesthesia complications.   The patient concurred with the proposed plan, giving informed consent for the procedure.   Patient has been NPO since last night. Anesthesia and OR aware.   Plan for suction D&C under ultrasound guidance Doxycycline 200 mg Iv NPO IVF SCDs   K. Arvilla Meres, M.D. Attending [place], [facility] for [employer], [place]

## 2017-12-30 NOTE — Anesthesia Preprocedure Evaluation (Addendum)
Anesthesia Evaluation  Patient identified by MRN, date of birth, ID band Patient awake    Reviewed: Allergy & Precautions, NPO status , Patient's Chart, lab work & pertinent test results  Airway Mallampati: II  TM Distance: >3 FB Neck ROM: Full    Dental  (+) Dental Advisory Given   Pulmonary neg pulmonary ROS,    Pulmonary exam normal breath sounds clear to auscultation       Cardiovascular negative cardio ROS Normal cardiovascular exam+ dysrhythmias  Rhythm:Regular Rate:Normal     Neuro/Psych negative neurological ROS  negative psych ROS   GI/Hepatic Neg liver ROS, GERD  Controlled,  Endo/Other  negative endocrine ROS  Renal/GU negative Renal ROS  negative genitourinary   Musculoskeletal negative musculoskeletal ROS (+)   Abdominal   Peds  Hematology negative hematology ROS (+)   Anesthesia Other Findings   Reproductive/Obstetrics Molar pregnancy                           Anesthesia Physical Anesthesia Plan  ASA: II  Anesthesia Plan: MAC   Post-op Pain Management:    Induction: Intravenous  PONV Risk Score and Plan:   Airway Management Planned: Nasal Cannula  Additional Equipment:   Intra-op Plan:   Post-operative Plan:   Informed Consent: I have reviewed the patients History and Physical, chart, labs and discussed the procedure including the risks, benefits and alternatives for the proposed anesthesia with the patient or authorized representative who has indicated his/her understanding and acceptance.   Dental advisory given  Plan Discussed with: CRNA  Anesthesia Plan Comments:         Anesthesia Quick Evaluation

## 2017-12-31 ENCOUNTER — Encounter (HOSPITAL_COMMUNITY): Payer: Self-pay | Admitting: Obstetrics and Gynecology

## 2017-12-31 LAB — GC/CHLAMYDIA PROBE AMP (~~LOC~~) NOT AT ARMC
CHLAMYDIA, DNA PROBE: NEGATIVE
Neisseria Gonorrhea: NEGATIVE

## 2018-01-04 ENCOUNTER — Inpatient Hospital Stay (HOSPITAL_COMMUNITY)
Admission: AD | Admit: 2018-01-04 | Discharge: 2018-01-05 | Disposition: A | Discharge status: Home / Self Care | Payer: Self-pay | Source: Ambulatory Visit | Attending: Obstetrics and Gynecology | Admitting: Obstetrics and Gynecology

## 2018-01-04 ENCOUNTER — Encounter (HOSPITAL_COMMUNITY): Payer: Self-pay | Admitting: *Deleted

## 2018-01-04 DIAGNOSIS — R109 Unspecified abdominal pain: Secondary | ICD-10-CM

## 2018-01-04 DIAGNOSIS — G8918 Other acute postprocedural pain: Secondary | ICD-10-CM

## 2018-01-04 DIAGNOSIS — IMO0002 Reserved for concepts with insufficient information to code with codable children: Secondary | ICD-10-CM

## 2018-01-04 DIAGNOSIS — O034 Incomplete spontaneous abortion without complication: Secondary | ICD-10-CM | POA: Insufficient documentation

## 2018-01-04 LAB — CBC WITH DIFFERENTIAL/PLATELET
Basophils Absolute: 0 10*3/uL (ref 0.0–0.1)
Basophils Relative: 0 %
EOS ABS: 0.1 10*3/uL (ref 0.0–0.7)
EOS PCT: 1 %
HCT: 38.9 % (ref 36.0–46.0)
Hemoglobin: 13.1 g/dL (ref 12.0–15.0)
LYMPHS ABS: 2.4 10*3/uL (ref 0.7–4.0)
LYMPHS PCT: 30 %
MCH: 30.5 pg (ref 26.0–34.0)
MCHC: 33.7 g/dL (ref 30.0–36.0)
MCV: 90.5 fL (ref 78.0–100.0)
MONO ABS: 0.3 10*3/uL (ref 0.1–1.0)
Monocytes Relative: 4 %
Neutro Abs: 5.1 10*3/uL (ref 1.7–7.7)
Neutrophils Relative %: 65 %
PLATELETS: 272 10*3/uL (ref 150–400)
RBC: 4.3 MIL/uL (ref 3.87–5.11)
RDW: 12.9 % (ref 11.5–15.5)
WBC: 7.8 10*3/uL (ref 4.0–10.5)

## 2018-01-04 LAB — URINALYSIS, ROUTINE W REFLEX MICROSCOPIC
BILIRUBIN URINE: NEGATIVE
GLUCOSE, UA: NEGATIVE mg/dL
Ketones, ur: NEGATIVE mg/dL
LEUKOCYTES UA: NEGATIVE
NITRITE: NEGATIVE
PROTEIN: NEGATIVE mg/dL
SPECIFIC GRAVITY, URINE: 1.016 (ref 1.005–1.030)
pH: 5 (ref 5.0–8.0)

## 2018-01-04 NOTE — MAU Note (Signed)
Pt had D&E on Jan 9, felt fine after surgery.  Started having blurred vision in R eye on Saturday night, began having abd cramping on Sunday night, took ibuprofen.  Woke up in middle of night with pain.  Pain continues today.  Having light bleeding.

## 2018-01-04 NOTE — MAU Note (Addendum)
Pt brought back to a room 4 at this time.   Pt states that her bleeding isn't as heavy as her normal menstrual cycle. It just concerned her that she was bleeding like this since the ninth.   The abd pain started yesterday in the night and could rate it 7 on scale 0-10. Tried ibuprofen and had no relief.

## 2018-01-05 ENCOUNTER — Encounter (HOSPITAL_COMMUNITY): Payer: Self-pay | Admitting: Emergency Medicine

## 2018-01-05 ENCOUNTER — Inpatient Hospital Stay (HOSPITAL_COMMUNITY): Payer: Self-pay

## 2018-01-05 DIAGNOSIS — G8918 Other acute postprocedural pain: Secondary | ICD-10-CM

## 2018-01-05 DIAGNOSIS — R1030 Lower abdominal pain, unspecified: Secondary | ICD-10-CM

## 2018-01-05 MED ORDER — KETOROLAC TROMETHAMINE 30 MG/ML IJ SOLN
60.0000 mg | Freq: Once | INTRAMUSCULAR | Status: AC
  Administered 2018-01-05: 60 mg via INTRAMUSCULAR
  Filled 2018-01-05: qty 2

## 2018-01-05 MED ORDER — MISOPROSTOL 200 MCG PO TABS
800.0000 ug | ORAL_TABLET | Freq: Once | ORAL | Status: AC
  Administered 2018-01-05: 800 ug via ORAL
  Filled 2018-01-05: qty 4

## 2018-01-05 MED ORDER — DOXYCYCLINE HYCLATE 100 MG PO CAPS: 100.0000 mg | ORAL_CAPSULE | Freq: Two times a day (BID) | ORAL | 0 refills | Status: DC

## 2018-01-05 MED ORDER — OXYCODONE HCL 5 MG PO TABS: 5.0000 mg | ORAL_TABLET | Freq: Four times a day (QID) | ORAL | 0 refills | Status: DC | PRN

## 2018-01-05 NOTE — Discharge Instructions (Signed)

## 2018-01-05 NOTE — MAU Provider Note (Signed)
History     CSN: [id number]  Arrival date and time: 01/04/18 1658   First Provider Initiated Contact with Patient 01/05/18 0009      Chief Complaint  Patient presents with  . Abdominal Pain   22 y.o. female 6 days s/p D&C for 11wk molar pregnancy here with LAP. Pain started last night. Describes as constant lower abdominal cramping. Rates 7/10. Took Ibuprofen 200 mg and had no relief. Bleeding is minimal. No urinary sx. No fevers. Eating an drinking well.    OB History    Gravida Para Term Preterm AB Living   1       1     SAB TAB Ectopic Multiple Live Births                  Past Medical History:  Diagnosis Date  . GERD (gastroesophageal reflux disease)    occasional -diet controlled, no meds  . Medical history non-contributory     Past Surgical History:  Procedure Laterality Date  . DILATION AND EVACUATION N/A 12/30/2017   Procedure: DILATATION AND EVACUATION;  Surgeon: Sloan Leiter, MD;  Location: [facility];  Service: Gynecology;  Laterality: N/A;  . NO PAST SURGERIES      History reviewed. No pertinent family history.  Social History   Tobacco Use  . Smoking status: Never Smoker  . Smokeless tobacco: Never Used  Substance Use Topics  . Alcohol use: No  . Drug use: No    Allergies: No Known Allergies  Medications Prior to Admission  Medication Sig Dispense Refill Last Dose  . docusate sodium (COLACE) 100 MG capsule Take 1 capsule (100 mg total) by mouth 2 (two) times daily as needed. 30 capsule 2   . ibuprofen (ADVIL,MOTRIN) 600 MG tablet Take 1 tablet (600 mg total) by mouth every 6 (six) hours as needed (mild pain). 20 tablet 0   . ondansetron (ZOFRAN ODT) 4 MG disintegrating tablet Take 1 tablet (4 mg total) by mouth every 8 (eight) hours as needed for nausea or vomiting. 20 tablet 0 Past Week at Unknown time    Review of Systems  Constitutional: Negative for fever.  Gastrointestinal: Positive for abdominal pain. Negative for constipation and diarrhea.   Genitourinary: Positive for vaginal bleeding. Negative for dysuria.   Physical Exam   Blood pressure (!) 146/77, pulse 79, temperature 98.4 F (36.9 C), temperature source Oral, resp. rate 17, height 5\' 1"  (1.549 m), weight 64 kg (141 lb), last menstrual period 10/11/2017, SpO2 100 %, unknown if currently breastfeeding.  Physical Exam  Constitutional: She is oriented to person, place, and time. She appears well-developed and well-nourished. No distress.  HENT:  Head: Normocephalic and atraumatic.  Neck: Normal range of motion.  Cardiovascular: Normal rate.  Respiratory: Effort normal.  GI: Soft. She exhibits no distension and no mass. There is no tenderness. There is no rebound and no guarding.  Genitourinary:  Genitourinary Comments: External: no lesions or erythema, ?POCs stuck to vulva Vagina: rugated, pink, moist, scant bloody discharge Uterus: non enlarged, anteverted, non tender, no CMT, cervix closed Adnexae: no masses, no tenderness left, no tenderness right   Musculoskeletal: Normal range of motion.  Neurological: She is alert and oriented to person, place, and time.  Skin: Skin is warm and dry.  Psychiatric: She has a normal mood and affect.   Results for orders placed or performed during the hospital encounter of 01/04/18 (from the past 24 hour(s))  Urinalysis, Routine w reflex microscopic  Status: Abnormal   Collection Time: 01/04/18  6:05 PM  Result Value Ref Range   Color, Urine YELLOW YELLOW   APPearance CLEAR CLEAR   Specific Gravity, Urine 1.016 1.005 - 1.030   pH 5.0 5.0 - 8.0   Glucose, UA NEGATIVE NEGATIVE mg/dL   Hgb urine dipstick LARGE (A) NEGATIVE   Bilirubin Urine NEGATIVE NEGATIVE   Ketones, ur NEGATIVE NEGATIVE mg/dL   Protein, ur NEGATIVE NEGATIVE mg/dL   Nitrite NEGATIVE NEGATIVE   Leukocytes, UA NEGATIVE NEGATIVE   RBC / HPF 0-5 0 - 5 RBC/hpf   WBC, UA 0-5 0 - 5 WBC/hpf   Bacteria, UA RARE (A) NONE SEEN   Squamous Epithelial / LPF 0-5 (A)  NONE SEEN   Mucus PRESENT   CBC with Differential     Status: None   Collection Time: 01/04/18  8:39 PM  Result Value Ref Range   WBC 7.8 4.0 - 10.5 K/uL   RBC 4.30 3.87 - 5.11 MIL/uL   Hemoglobin 13.1 12.0 - 15.0 g/dL   HCT 38.9 36.0 - 46.0 %   MCV 90.5 78.0 - 100.0 fL   MCH 30.5 26.0 - 34.0 pg   MCHC 33.7 30.0 - 36.0 g/dL   RDW 12.9 11.5 - 15.5 %   Platelets 272 150 - 400 K/uL   Neutrophils Relative % 65 %   Neutro Abs 5.1 1.7 - 7.7 K/uL   Lymphocytes Relative 30 %   Lymphs Abs 2.4 0.7 - 4.0 K/uL   Monocytes Relative 4 %   Monocytes Absolute 0.3 0.1 - 1.0 K/uL   Eosinophils Relative 1 %   Eosinophils Absolute 0.1 0.0 - 0.7 K/uL   Basophils Relative 0 %   Basophils Absolute 0.0 0.0 - 0.1 K/uL   US Pelvic Complete With Transvaginal  Result Date: 01/05/2018 CLINICAL DATA:  22 year old female status post D&E on 12/30/2017. Patient presents with pelvic pain and bleeding for 1 day. EXAM: TRANSABDOMINAL AND TRANSVAGINAL ULTRASOUND OF PELVIS TECHNIQUE: Both transabdominal and transvaginal ultrasound examinations of the pelvis were performed. Transabdominal technique was performed for global imaging of the pelvis including uterus, ovaries, adnexal regions, and pelvic cul-de-sac. It was necessary to proceed with endovaginal exam following the transabdominal exam to visualize the endometrium and adnexa. COMPARISON:  12/26/2017 obstetric scan. FINDINGS: Uterus Measurements: 7.7 x 4.8 x 5.4 cm. Retroverted uterus is normal in size and configuration. No uterine fibroids or other myometrial abnormalities. Endometrium Thickness: 10 mm. Heterogeneous endometrium with scattered ill-defined foci of fluid in the lower cavity and no focal endometrial mass. Questionable mild vascularity is demonstrated within the endometrium on color Doppler. Right ovary Measurements: 2.7 x 1.3 x 1.0 cm. Normal appearance/no adnexal mass. Left ovary Measurements: 3.5 x 2.0 x 2.0 cm. Normal appearance/no adnexal mass. Other  findings No abnormal free fluid. IMPRESSION: 1. Heterogeneous endometrium measuring 10 mm in bilayer thickness. No focal endometrial mass. Questionable mild endometrial vascularity on color Doppler. Findings are nonspecific and could indicate endometrial blood products, although hypovascular retained products of conception cannot be excluded. Consider short-term follow-up pelvic ultrasound or pelvic MRI without and with IV contrast, as clinically warranted. 2. No abnormal ovarian or adnexal masses. No abnormal free fluid in the pelvis. Electronically Signed   By: Ilona Sorrel M.D.   On: 01/05/2018 01:21   MAU Course  Procedures Toradol  MDM Labs and [place] ordered and reviewed. Consult with Dr. Kennon Rounds regarding presentation, clinical findings, and US findings. Will treat for suspected retained POCs and cover with  abx. Tissue to pathology. Stable for discharge home.    Assessment and Plan   1. Retained products of conception without hemorrhage   2. Post-op pain   3. Abdominal pain    Discharge home Follow up in OB office next week as scheduled Rx Doxycycline  Rx Oxycodone 5 mg #6 Ibuprofen 600 mg q6 hrs prn Bleeding/return precautions  Allergies as of 01/05/2018   No Known Allergies     Medication List    TAKE these medications   docusate sodium 100 MG capsule Commonly known as:  COLACE Take 1 capsule (100 mg total) by mouth 2 (two) times daily as needed.   doxycycline 100 MG capsule Commonly known as:  VIBRAMYCIN Take 1 capsule (100 mg total) by mouth 2 (two) times daily.   ibuprofen 600 MG tablet Commonly known as:  ADVIL,MOTRIN Take 1 tablet (600 mg total) by mouth every 6 (six) hours as needed (mild pain).   ondansetron 4 MG disintegrating tablet Commonly known as:  ZOFRAN ODT Take 1 tablet (4 mg total) by mouth every 8 (eight) hours as needed for nausea or vomiting.   oxyCODONE 5 MG immediate release tablet Commonly known as:  ROXICODONE Take 1-2 tablets (5-10 mg total)  by mouth every 6 (six) hours as needed for severe pain.       Julianne Handler, CNM 01/05/2018, 12:22 AM

## 2018-01-09 ENCOUNTER — Telehealth: Payer: Self-pay | Admitting: Obstetrics and Gynecology

## 2018-01-09 NOTE — Telephone Encounter (Signed)
Returned patient call, she was seen for cramping and bleeding post op. Prescribed doxycycline and had some vomiting with taking her doxycycline. Has not taken any doses since. Reviewed that she should continue to take it but eat first. She verbalizes understanding, will start to take. Answered all questions. She will follow up in office.    Feliz Beam, M.D. Attending [place], [facility] for [employer], [place]

## 2018-01-13 ENCOUNTER — Ambulatory Visit (INDEPENDENT_AMBULATORY_CARE_PROVIDER_SITE_OTHER): Payer: Self-pay | Admitting: Obstetrics and Gynecology

## 2018-01-13 ENCOUNTER — Encounter: Payer: Self-pay | Admitting: Obstetrics and Gynecology

## 2018-01-13 VITALS — BP 113/73 | HR 92 | Wt 141.0 lb

## 2018-01-13 DIAGNOSIS — Z9889 Other specified postprocedural states: Secondary | ICD-10-CM

## 2018-01-13 DIAGNOSIS — O020 Blighted ovum and nonhydatidiform mole: Secondary | ICD-10-CM

## 2018-01-13 NOTE — Progress Notes (Signed)
Pt stated still have spotting(brown)

## 2018-01-13 NOTE — Progress Notes (Signed)
GYNECOLOGY OFFICE FOLLOWIP NOTE  History:  22 y.o. G1P0010 here today for follow up for D&C for suspected molar pregnancy, confirmed on pathology. Returned to MAU 1 week after procedure with cramping/spotting, dx with retained products and given cytotec, reports she passed products and blood clots after that. She is now feeling well, some cramping with bowel movements. Normal appetite and otherwise, feeling back to her usual self.  Past Medical History:  Diagnosis Date  . GERD (gastroesophageal reflux disease)    occasional -diet controlled, no meds  . Medical history non-contributory     Past Surgical History:  Procedure Laterality Date  . DILATION AND EVACUATION N/A 12/30/2017   Procedure: DILATATION AND EVACUATION;  Surgeon: Sloan Leiter, MD;  Location: [facility];  Service: Gynecology;  Laterality: N/A;  . NO PAST SURGERIES       Current Outpatient Medications:  .  docusate sodium (COLACE) 100 MG capsule, Take 1 capsule (100 mg total) by mouth 2 (two) times daily as needed. (Patient not taking: Reported on 01/13/2018), Disp: 30 capsule, Rfl: 2 .  ibuprofen (ADVIL,MOTRIN) 600 MG tablet, Take 1 tablet (600 mg total) by mouth every 6 (six) hours as needed (mild pain). (Patient not taking: Reported on 01/13/2018), Disp: 20 tablet, Rfl: 0  The following portions of the patient's history were reviewed and updated as appropriate: allergies, current medications, past family history, past medical history, past social history, past surgical history and problem list.   Review of Systems:  Pertinent items noted in HPI and remainder of comprehensive ROS otherwise negative.   Objective:  Physical Exam BP 113/73   Pulse 92   Wt 141 lb (64 kg)   LMP 10/11/2017 (Exact Date) Comment: approx [redacted] wks gestation  BMI 26.64 kg/m  CONSTITUTIONAL: Well-developed, well-nourished female in no acute distress.  HENT:  Normocephalic, atraumatic. External right and left ear normal. Oropharynx is clear and  moist EYES: Conjunctivae and EOM are normal. Pupils are equal, round, and reactive to light. No scleral icterus.  NECK: Normal range of motion, supple, no masses SKIN: Skin is warm and dry. No rash noted. Not diaphoretic. No erythema. No pallor. NEUROLOGIC: Alert and oriented to person, place, and time. Normal reflexes, muscle tone coordination. No cranial nerve deficit noted. PSYCHIATRIC: Normal mood and affect. Normal behavior. Normal judgment and thought content. CARDIOVASCULAR: Normal heart rate noted RESPIRATORY: Effort and breath sounds normal, no problems with respiration noted ABDOMEN: Soft, no distention noted.   PELVIC: Deferred MUSCULOSKELETAL: Normal range of motion. No edema noted.  Labs and Imaging US Ob Comp Less 14 Wks  Result Date: 12/26/2017 CLINICAL DATA:  Pelvic pain. No gestational sac seen on outside ultrasound. Quantitative beta HCG is pending. EXAM: OBSTETRIC <14 WK [place] AND TRANSVAGINAL OB US TECHNIQUE: Both transabdominal and transvaginal ultrasound examinations were performed for complete evaluation of the gestation as well as the maternal uterus, adnexal regions, and pelvic cul-de-sac. Transvaginal technique was performed to assess early pregnancy. COMPARISON:  None. FINDINGS: Intrauterine gestational sac: None Yolk sac:  Not Visualized. Embryo:  Not Visualized. Cardiac Activity: Not Visualized. Maternal uterus/adnexae: Uterus is somewhat retroverted. Endometrium is expanded and there is heterogeneous multi cystic and hyperechoic uterine structure with peripheral flow. Appearances likely indicate molar pregnancy. Both ovaries are demonstrated and are normal in appearance. Corpus luteal cyst on the left. No free fluid in the pelvis. IMPRESSION: A normal intrauterine pregnancy is not identified. The endometrium is expanded with heterogeneous multi-cystic in hyperechoic mass likely representing a molar pregnancy. Electronically  Signed   By: Lucienne Capers M.D.   On: 12/26/2017  22:47   US Ob Transvaginal  Result Date: 12/26/2017 CLINICAL DATA:  Pelvic pain. No gestational sac seen on outside ultrasound. Quantitative beta HCG is pending. EXAM: OBSTETRIC <14 WK [place] AND TRANSVAGINAL OB US TECHNIQUE: Both transabdominal and transvaginal ultrasound examinations were performed for complete evaluation of the gestation as well as the maternal uterus, adnexal regions, and pelvic cul-de-sac. Transvaginal technique was performed to assess early pregnancy. COMPARISON:  None. FINDINGS: Intrauterine gestational sac: None Yolk sac:  Not Visualized. Embryo:  Not Visualized. Cardiac Activity: Not Visualized. Maternal uterus/adnexae: Uterus is somewhat retroverted. Endometrium is expanded and there is heterogeneous multi cystic and hyperechoic uterine structure with peripheral flow. Appearances likely indicate molar pregnancy. Both ovaries are demonstrated and are normal in appearance. Corpus luteal cyst on the left. No free fluid in the pelvis. IMPRESSION: A normal intrauterine pregnancy is not identified. The endometrium is expanded with heterogeneous multi-cystic in hyperechoic mass likely representing a molar pregnancy. Electronically Signed   By: Lucienne Capers M.D.   On: 12/26/2017 22:47   [place] Intraoperative  Result Date: 12/30/2017 CLINICAL DATA:  Ultrasound was provided for use by the ordering physician, and a technical charge was applied by the performing facility.  No radiologist interpretation/professional services rendered.   US Pelvic Complete With Transvaginal  Result Date: 01/05/2018 CLINICAL DATA:  22 year old female status post D&E on 12/30/2017. Patient presents with pelvic pain and bleeding for 1 day. EXAM: TRANSABDOMINAL AND TRANSVAGINAL ULTRASOUND OF PELVIS TECHNIQUE: Both transabdominal and transvaginal ultrasound examinations of the pelvis were performed. Transabdominal technique was performed for global imaging of the pelvis including uterus, ovaries, adnexal regions, and  pelvic cul-de-sac. It was necessary to proceed with endovaginal exam following the transabdominal exam to visualize the endometrium and adnexa. COMPARISON:  12/26/2017 obstetric scan. FINDINGS: Uterus Measurements: 7.7 x 4.8 x 5.4 cm. Retroverted uterus is normal in size and configuration. No uterine fibroids or other myometrial abnormalities. Endometrium Thickness: 10 mm. Heterogeneous endometrium with scattered ill-defined foci of fluid in the lower cavity and no focal endometrial mass. Questionable mild vascularity is demonstrated within the endometrium on color Doppler. Right ovary Measurements: 2.7 x 1.3 x 1.0 cm. Normal appearance/no adnexal mass. Left ovary Measurements: 3.5 x 2.0 x 2.0 cm. Normal appearance/no adnexal mass. Other findings No abnormal free fluid. IMPRESSION: 1. Heterogeneous endometrium measuring 10 mm in bilayer thickness. No focal endometrial mass. Questionable mild endometrial vascularity on color Doppler. Findings are nonspecific and could indicate endometrial blood products, although hypovascular retained products of conception cannot be excluded. Consider short-term follow-up pelvic ultrasound or pelvic MRI without and with IV contrast, as clinically warranted. 2. No abnormal ovarian or adnexal masses. No abnormal free fluid in the pelvis. Electronically Signed   By: Ilona Sorrel M.D.   On: 01/05/2018 01:21    Assessment & Plan:  1. Molar pregnancy - B-HCG Quant today - patient had presented to MAU with cramping, found to have retained products and given cytotec, after which she passed more tissue and clots, now with minimal brown discharge - will obtain HCG today - reviewed importance of ensuring all pregnancy tissue is gone and that she needs weekly HCG for at least 4 weeks, reviewed that if any pregnancy tissue remains, it could cause cancer if not treated appropriately - reviewed importance of avoiding pregnancy until molar pregnancy is resolved, offered contraception, she  declines today - patient verbalizes understanding of the above and the importance of  follow up as well as avoiding new pregnancy for now - patient will call with any issues   2. Post-operative state Doing well   Routine preventative health maintenance measures emphasized. Please refer to After Visit Summary for other counseling recommendations.   Return in about 1 week (around 01/20/2018) for blood work.    Feliz Beam, M.D. Attending [place], [facility] for [employer], [place]

## 2018-01-14 LAB — BETA HCG QUANT (REF LAB): HCG QUANT: 513 m[IU]/mL

## 2018-01-15 ENCOUNTER — Other Ambulatory Visit: Payer: Self-pay | Admitting: Obstetrics and Gynecology

## 2018-01-15 DIAGNOSIS — O020 Blighted ovum and nonhydatidiform mole: Secondary | ICD-10-CM

## 2018-01-15 NOTE — Progress Notes (Signed)
.  bh

## 2018-01-20 ENCOUNTER — Other Ambulatory Visit: Payer: Self-pay

## 2018-01-20 DIAGNOSIS — O020 Blighted ovum and nonhydatidiform mole: Secondary | ICD-10-CM

## 2018-01-21 LAB — BETA HCG QUANT (REF LAB): HCG QUANT: 110 m[IU]/mL

## 2018-01-27 ENCOUNTER — Other Ambulatory Visit: Payer: Self-pay

## 2018-01-27 DIAGNOSIS — O020 Blighted ovum and nonhydatidiform mole: Secondary | ICD-10-CM

## 2018-01-28 LAB — BETA HCG QUANT (REF LAB): hCG Quant: 32 m[IU]/mL

## 2018-01-29 ENCOUNTER — Encounter: Payer: Self-pay | Admitting: Obstetrics and Gynecology

## 2018-02-03 ENCOUNTER — Other Ambulatory Visit: Payer: Self-pay

## 2018-02-03 DIAGNOSIS — O020 Blighted ovum and nonhydatidiform mole: Secondary | ICD-10-CM

## 2018-02-04 ENCOUNTER — Encounter: Payer: Self-pay | Admitting: Obstetrics and Gynecology

## 2018-02-04 LAB — BETA HCG QUANT (REF LAB): hCG Quant: 13 m[IU]/mL

## 2018-02-05 ENCOUNTER — Other Ambulatory Visit: Payer: Self-pay | Admitting: General Practice

## 2018-02-05 DIAGNOSIS — O020 Blighted ovum and nonhydatidiform mole: Secondary | ICD-10-CM

## 2018-02-10 ENCOUNTER — Other Ambulatory Visit: Payer: Self-pay

## 2018-02-10 DIAGNOSIS — O020 Blighted ovum and nonhydatidiform mole: Secondary | ICD-10-CM

## 2018-02-11 LAB — BETA HCG QUANT (REF LAB): hCG Quant: 6 m[IU]/mL

## 2018-02-13 ENCOUNTER — Encounter: Payer: Self-pay | Admitting: Obstetrics and Gynecology

## 2018-02-17 ENCOUNTER — Other Ambulatory Visit: Payer: Self-pay | Admitting: *Deleted

## 2018-02-17 ENCOUNTER — Other Ambulatory Visit: Payer: Self-pay

## 2018-02-17 DIAGNOSIS — O020 Blighted ovum and nonhydatidiform mole: Secondary | ICD-10-CM

## 2018-02-18 ENCOUNTER — Other Ambulatory Visit: Payer: Self-pay | Admitting: General Practice

## 2018-02-18 ENCOUNTER — Encounter: Payer: Self-pay | Admitting: General Practice

## 2018-02-18 DIAGNOSIS — O020 Blighted ovum and nonhydatidiform mole: Secondary | ICD-10-CM

## 2018-02-18 LAB — BETA HCG QUANT (REF LAB): hCG Quant: 4 m[IU]/mL

## 2018-02-24 ENCOUNTER — Other Ambulatory Visit: Payer: Self-pay

## 2018-02-24 DIAGNOSIS — O020 Blighted ovum and nonhydatidiform mole: Secondary | ICD-10-CM

## 2018-02-25 ENCOUNTER — Encounter: Payer: Self-pay | Admitting: Obstetrics and Gynecology

## 2018-02-25 LAB — BETA HCG QUANT (REF LAB): hCG Quant: 2 m[IU]/mL

## 2018-02-26 ENCOUNTER — Other Ambulatory Visit: Payer: Self-pay | Admitting: General Practice

## 2018-02-26 DIAGNOSIS — O020 Blighted ovum and nonhydatidiform mole: Secondary | ICD-10-CM

## 2018-03-03 ENCOUNTER — Other Ambulatory Visit: Payer: Self-pay

## 2018-03-03 DIAGNOSIS — O020 Blighted ovum and nonhydatidiform mole: Secondary | ICD-10-CM

## 2018-03-04 LAB — BETA HCG QUANT (REF LAB): hCG Quant: 1 m[IU]/mL

## 2018-03-05 ENCOUNTER — Encounter: Payer: Self-pay | Admitting: Obstetrics and Gynecology

## 2018-03-08 ENCOUNTER — Encounter: Payer: Self-pay | Admitting: Obstetrics and Gynecology

## 2018-03-31 ENCOUNTER — Other Ambulatory Visit: Payer: Self-pay

## 2018-03-31 DIAGNOSIS — O020 Blighted ovum and nonhydatidiform mole: Secondary | ICD-10-CM

## 2018-04-01 LAB — BETA HCG QUANT (REF LAB)

## 2018-04-30 ENCOUNTER — Encounter: Payer: Self-pay | Admitting: Obstetrics and Gynecology

## 2018-05-03 ENCOUNTER — Encounter: Payer: Self-pay | Admitting: Obstetrics and Gynecology

## 2018-05-07 ENCOUNTER — Other Ambulatory Visit: Payer: Self-pay | Admitting: *Deleted

## 2018-05-07 DIAGNOSIS — O020 Blighted ovum and nonhydatidiform mole: Secondary | ICD-10-CM

## 2018-05-10 ENCOUNTER — Other Ambulatory Visit: Payer: Self-pay

## 2018-05-11 ENCOUNTER — Other Ambulatory Visit: Payer: Self-pay

## 2018-05-11 DIAGNOSIS — O020 Blighted ovum and nonhydatidiform mole: Secondary | ICD-10-CM

## 2018-05-12 LAB — BETA HCG QUANT (REF LAB)

## 2018-06-10 ENCOUNTER — Other Ambulatory Visit: Payer: Self-pay | Admitting: *Deleted

## 2018-06-10 DIAGNOSIS — Z9889 Other specified postprocedural states: Secondary | ICD-10-CM

## 2018-06-10 DIAGNOSIS — O020 Blighted ovum and nonhydatidiform mole: Secondary | ICD-10-CM

## 2018-06-11 ENCOUNTER — Other Ambulatory Visit: Payer: Self-pay

## 2018-06-11 DIAGNOSIS — Z9889 Other specified postprocedural states: Secondary | ICD-10-CM

## 2018-06-11 DIAGNOSIS — O020 Blighted ovum and nonhydatidiform mole: Secondary | ICD-10-CM

## 2018-06-12 LAB — BETA HCG QUANT (REF LAB)

## 2018-07-16 ENCOUNTER — Other Ambulatory Visit: Payer: Self-pay

## 2018-07-16 DIAGNOSIS — O020 Blighted ovum and nonhydatidiform mole: Secondary | ICD-10-CM

## 2018-07-17 LAB — BETA HCG QUANT (REF LAB)

## 2018-07-19 ENCOUNTER — Encounter: Payer: Self-pay | Admitting: Obstetrics and Gynecology

## 2018-08-13 ENCOUNTER — Other Ambulatory Visit: Payer: Self-pay

## 2018-08-13 DIAGNOSIS — O020 Blighted ovum and nonhydatidiform mole: Secondary | ICD-10-CM

## 2018-08-14 LAB — BETA HCG QUANT (REF LAB)

## 2018-09-07 ENCOUNTER — Other Ambulatory Visit: Payer: Self-pay | Admitting: General Practice

## 2018-09-07 DIAGNOSIS — O020 Blighted ovum and nonhydatidiform mole: Secondary | ICD-10-CM

## 2018-09-10 ENCOUNTER — Other Ambulatory Visit: Payer: Self-pay

## 2018-09-10 DIAGNOSIS — O020 Blighted ovum and nonhydatidiform mole: Secondary | ICD-10-CM

## 2018-09-11 LAB — BETA HCG QUANT (REF LAB)

## 2018-10-07 ENCOUNTER — Other Ambulatory Visit: Payer: Self-pay

## 2018-10-07 ENCOUNTER — Ambulatory Visit (HOSPITAL_COMMUNITY)
Admission: EM | Admit: 2018-10-07 | Discharge: 2018-10-07 | Disposition: A | Discharge status: Home / Self Care | Payer: Self-pay | Attending: Family Medicine | Admitting: Family Medicine

## 2018-10-07 ENCOUNTER — Encounter (HOSPITAL_COMMUNITY): Payer: Self-pay | Admitting: Emergency Medicine

## 2018-10-07 DIAGNOSIS — J029 Acute pharyngitis, unspecified: Secondary | ICD-10-CM | POA: Insufficient documentation

## 2018-10-07 DIAGNOSIS — Z9889 Other specified postprocedural states: Secondary | ICD-10-CM | POA: Insufficient documentation

## 2018-10-07 DIAGNOSIS — J069 Acute upper respiratory infection, unspecified: Secondary | ICD-10-CM

## 2018-10-07 DIAGNOSIS — Z791 Long term (current) use of non-steroidal anti-inflammatories (NSAID): Secondary | ICD-10-CM | POA: Insufficient documentation

## 2018-10-07 DIAGNOSIS — B9789 Other viral agents as the cause of diseases classified elsewhere: Secondary | ICD-10-CM

## 2018-10-07 DIAGNOSIS — Z79899 Other long term (current) drug therapy: Secondary | ICD-10-CM | POA: Insufficient documentation

## 2018-10-07 LAB — POCT RAPID STREP A: Streptococcus, Group A Screen (Direct): NEGATIVE

## 2018-10-07 MED ORDER — BENZONATATE 100 MG PO CAPS: 100.0000 mg | ORAL_CAPSULE | Freq: Three times a day (TID) | ORAL | 0 refills | Status: DC

## 2018-10-07 NOTE — Discharge Instructions (Addendum)
Your rapid strep test is negative.  We will send for culture We will treat her cough with benzonatate you can take it every 8 hours as needed for cough. You can continue the DayQuil and NyQuil as needed Warm tea and honey can help with your sore throat Flonase and Zyrtec can help with nasal congestion and drainage. Follow up as needed for continued or worsening symptoms

## 2018-10-07 NOTE — ED Triage Notes (Signed)
Onset last Tuesday with a sore throat and then developed a cough.  otc medicines not helping.  Pain on right side of throat the most and pain towards right ear.  Tuesday patient thinks she ran a fever, unknown how high

## 2018-10-07 NOTE — ED Provider Notes (Signed)
[place]    CSN: [id number] Arrival date & time: 10/07/18  6629     History   Chief Complaint Chief Complaint  Patient presents with  . Sore Throat    HPI Erica Hall is a 22 y.o. female.   The history is provided by the patient.  URI  Presenting symptoms: congestion, cough, ear pain, fever, rhinorrhea and sore throat   Severity:  Moderate Onset quality:  Gradual Duration:  8 days Timing:  Intermittent Progression:  Waxing and waning Chronicity:  New Relieved by:  Nothing Worsened by:  Drinking and eating Ineffective treatments:  OTC medications Associated symptoms: no arthralgias, no headaches, no myalgias, no neck pain, no sinus pain, no sneezing, no swollen glands and no wheezing   Risk factors: no chronic respiratory disease, no recent travel and no sick contacts     Past Medical History:  Diagnosis Date  . GERD (gastroesophageal reflux disease)    occasional -diet controlled, no meds  . Medical history non-contributory     Patient Active Problem List   Diagnosis Date Noted  . 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 DILATION AND EVACUATION N/A 12/30/2017   Procedure: DILATATION AND EVACUATION;  Surgeon: Sloan Leiter, MD;  Location: [facility];  Service: Gynecology;  Laterality: N/A;  . NO PAST SURGERIES      OB History    Gravida  1   Para      Term      Preterm      AB  1   Living        SAB      TAB      Ectopic      Multiple      Live Births               Home Medications    Prior to Admission medications   Medication Sig Start Date End Date Taking? Authorizing Provider  benzonatate (TESSALON) 100 MG capsule Take 1 capsule (100 mg total) by mouth every 8 (eight) hours. 10/07/18   Loura Halt A, NP  docusate sodium (COLACE) 100 MG capsule Take 1 capsule (100 mg total) by mouth 2 (two) times daily as needed. Patient not taking: Reported on 01/13/2018 12/30/17    Sloan Leiter, MD  ibuprofen (ADVIL,MOTRIN) 600 MG tablet Take 1 tablet (600 mg total) by mouth every 6 (six) hours as needed (mild pain). Patient not taking: Reported on 01/13/2018 12/30/17   Sloan Leiter, MD    Family History Family History  Problem Relation Age of Onset  . Hypertension Father   . Hyperlipidemia Father     Social History Social History   Tobacco Use  . Smoking status: Never Smoker  . Smokeless tobacco: Never Used  Substance Use Topics  . Alcohol use: No  . Drug use: No     Allergies   Patient has no known allergies.   Review of Systems Review of Systems  Constitutional: Positive for fever.  HENT: Positive for congestion, ear pain, rhinorrhea and sore throat. Negative for sinus pain and sneezing.   Respiratory: Positive for cough. Negative for wheezing.   Musculoskeletal: Negative for arthralgias, myalgias and neck pain.  Neurological: Negative for headaches.     Physical Exam Triage Vital Signs ED Triage Vitals  Enc Vitals Group     BP 10/07/18 0826 119/82     Pulse Rate 10/07/18 0826 81  Resp 10/07/18 0826 18     Temp 10/07/18 0826 98.2 F (36.8 C)     Temp Source 10/07/18 0826 Oral     SpO2 10/07/18 0826 97 %     Weight --      Height --      Head Circumference --      Peak Flow --      Pain Score 10/07/18 0823 5     Pain Loc --      Pain Edu? --      Excl. in [place]? --    No data found.  Updated Vital Signs BP 119/82 (BP Location: Left Arm)   Pulse 81   Temp 98.2 F (36.8 C) (Oral)   Resp 18   LMP 09/13/2018   SpO2 97%   Visual Acuity Right Eye Distance:   Left Eye Distance:   Bilateral Distance:    Right Eye Near:   Left Eye Near:    Bilateral Near:     Physical Exam  Constitutional: She appears well-developed and well-nourished.  Very pleasant. Non toxic or ill appearing.   HENT:  Head: Normocephalic and atraumatic.  Right Ear: Hearing normal.  Left Ear: Hearing normal.  Mouth/Throat: Mucous membranes are  normal. Tonsils are 1+ on the right. Tonsils are 1+ on the left.  Bilateral TMs normal.  External ears normal.  Mild posterior oropharyngeal erythema, 1 + tonsillar swelling without exudates. No lesions.  No lymphadenopathy.   Eyes: Pupils are equal, round, and reactive to light.  Neck: Normal range of motion.  Cardiovascular: Normal rate, regular rhythm and normal heart sounds.  Pulmonary/Chest: Effort normal.  Lungs clear in all fields. No dyspnea or distress. No retractions or nasal flaring.   Neurological: She is alert.  Skin: Skin is warm and dry.  Psychiatric: She has a normal mood and affect.  Nursing note and vitals reviewed.    UC Treatments / Results  Labs (all labs ordered are listed, but only abnormal results are displayed) Labs Reviewed  CULTURE, GROUP A STREP [facility])  POCT RAPID STREP A    EKG None  Radiology No results found.  Procedures Procedures (including critical care time)  Medications Ordered in UC Medications - No data to display  Initial Impression / Assessment and Plan / UC Course  I have reviewed the triage vital signs and the nursing notes.  Pertinent labs & imaging results that were available during my care of the patient were reviewed by me and considered in my medical decision making (see chart for details).     Rapid strep negative Most likely viral Will treat cough with benzonatate She can do zyrtec and Flonase for congestion and sore throat with drainage.  Follow up as needed for continued or worsening symptoms  Final Clinical Impressions(s) / UC Diagnoses   Final diagnoses:  Viral URI with cough     Discharge Instructions     Your rapid strep test is negative.  We will send for culture We will treat her cough with benzonatate you can take it every 8 hours as needed for cough. You can continue the DayQuil and NyQuil as needed Warm tea and honey can help with your sore throat Flonase and Zyrtec can help with nasal  congestion and drainage. Follow up as needed for continued or worsening symptoms     ED Prescriptions    Medication Sig Dispense Auth. Provider   benzonatate (TESSALON) 100 MG capsule Take 1 capsule (100 mg total) by mouth every  8 (eight) hours. 21 capsule Loura Halt A, NP     Controlled Substance Prescriptions [place] Controlled Substance Registry consulted? Not Applicable   Orvan July, NP 10/07/18 [phone]

## 2018-10-09 LAB — CULTURE, GROUP A STREP (THRC)

## 2019-08-02 ENCOUNTER — Ambulatory Visit: Payer: Self-pay | Admitting: Advanced Practice Midwife

## 2019-08-12 ENCOUNTER — Ambulatory Visit: Payer: Self-pay | Admitting: Medical

## 2019-12-23 NOTE — L&D Delivery Note (Signed)
  Delivery Note At 1:27 AM a viable female was delivered via  (Presentation: OP).  APGAR:8/9 , ; weight pending.  After 1 minute, the cord was clamped and cut. 40 units of pitocin diluted in 1000cc LR was infused rapidly IV.  The placenta separated spontaneously and delivered via CCT and maternal pushing effort.  It was inspected and appears to be intact with a 3 VC. 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  Uterus remained boggy so cytotec 800 mcg was given PR  Bleeding continued to ooze, so TXA 1gm given IV.  Bleeding stabilized.  Anesthesia:  epidural Episiotomy:  none Lacerations:  1st degree Suture Repair: 2.0  Vicryl Est. Blood Loss (mL):  see RN's documentation (not available at this time)  Mom to postpartum.  Baby to Couplet care / Skin to Skin.  Erica Hall 12/18/2020, 2:03 AM

## 2020-01-20 ENCOUNTER — Ambulatory Visit: Payer: Self-pay | Admitting: Family Medicine

## 2020-01-27 ENCOUNTER — Other Ambulatory Visit: Payer: Self-pay

## 2020-01-27 ENCOUNTER — Encounter: Payer: Self-pay | Admitting: Family Medicine

## 2020-01-27 ENCOUNTER — Ambulatory Visit (INDEPENDENT_AMBULATORY_CARE_PROVIDER_SITE_OTHER): Payer: No Typology Code available for payment source | Admitting: Family Medicine

## 2020-01-27 VITALS — BP 117/77 | HR 83 | Temp 98.3°F | Ht 67.0 in | Wt 167.0 lb

## 2020-01-27 DIAGNOSIS — G44219 Episodic tension-type headache, not intractable: Secondary | ICD-10-CM

## 2020-01-27 DIAGNOSIS — R5383 Other fatigue: Secondary | ICD-10-CM

## 2020-01-27 DIAGNOSIS — Z6826 Body mass index (BMI) 26.0-26.9, adult: Secondary | ICD-10-CM

## 2020-01-27 DIAGNOSIS — F4329 Adjustment disorder with other symptoms: Secondary | ICD-10-CM

## 2020-01-27 NOTE — Progress Notes (Signed)
2/5/20213:16 PM  Erica Hall Sep 16, 1996, 24 y.o., female [id number]  Chief Complaint  Patient presents with  . Annual Exam    HPI:   Patient is a 24 y.o. female who presents today to establish care and concerns for headaches and weight gain  Patient is overall healthy but for past several months has been having about 1-2 band like/pressure headaches a week. Happens at work in the afternoon. Resolves with APAP. No vision changes but her eyes do get tired and sensitive to lights. No nausea or vomiting.  She also has been struggling with coping with stress, feeling tired and over eating Sleeps about 7 hrs a night, does not thinks she snores Recently trying to do zumba or another cardio exercise twice a week  Reports normal pap in 2019  Does not smoke or use illicit substances Drinks maybe 2 drinks a week Completed high school Works as support rep - computer work  Depression screen [place] 2/9 01/27/2020  Decreased Interest 0  Down, Depressed, Hopeless 0  PHQ - 2 Score 0    Fall Risk  01/27/2020  Falls in the past year? 0  Number falls in past yr: 0  Injury with Fall? 0     No Known Allergies  Prior to Admission medications   Not on File    Past Medical History:  Diagnosis Date  . GERD (gastroesophageal reflux disease)    occasional -diet controlled, no meds  . Medical history non-contributory   . Molar pregnancy 2019    Past Surgical History:  Procedure Laterality Date  . DILATION AND EVACUATION N/A 12/30/2017   Procedure: DILATATION AND EVACUATION;  Surgeon: Sloan Leiter, MD;  Location: [facility];  Service: Gynecology;  Laterality: N/A;  . NO PAST SURGERIES      Social History   Tobacco Use  . Smoking status: Never Smoker  . Smokeless tobacco: Never Used  Substance Use Topics  . Alcohol use: No    Family History  Problem Relation Age of Onset  . Hypertension Father   . Hyperlipidemia Father     Review of Systems  Constitutional: Positive for  malaise/fatigue. Negative for chills and fever.  Respiratory: Negative for cough and shortness of breath.   Cardiovascular: Negative for chest pain, palpitations and leg swelling.  Gastrointestinal: Positive for constipation. Negative for abdominal pain, nausea and vomiting.  Musculoskeletal: Positive for back pain and neck pain.  Endo/Heme/Allergies: Negative for polydipsia.  Psychiatric/Behavioral: Negative for depression. The patient does not have insomnia.   All other systems reviewed and are negative. per hpi   OBJECTIVE:  Today's Vitals   01/27/20 1512  BP: 117/77  Pulse: 83  Temp: 98.3 F (36.8 C)  SpO2: 100%  Weight: 167 lb (75.8 kg)  Height: 5\' 7"  (1.702 m)   Body mass index is 26.16 kg/m.   Physical Exam Vitals and nursing note reviewed.  Constitutional:      Appearance: She is well-developed.  HENT:     Head: Normocephalic and atraumatic.     Right Ear: Hearing, tympanic membrane, ear canal and external ear normal.     Left Ear: Hearing, tympanic membrane, ear canal and external ear normal.     Mouth/Throat:     Mouth: Mucous membranes are moist.     Pharynx: No oropharyngeal exudate or posterior oropharyngeal erythema.  Eyes:     Extraocular Movements: Extraocular movements intact.     Conjunctiva/sclera: Conjunctivae normal.     Pupils: Pupils are equal, round,  and reactive to light.  Neck:     Thyroid: No thyromegaly.  Cardiovascular:     Rate and Rhythm: Normal rate and regular rhythm.     Heart sounds: Normal heart sounds. No murmur. No friction rub. No gallop.   Pulmonary:     Effort: Pulmonary effort is normal.     Breath sounds: Normal breath sounds. No wheezing, rhonchi or rales.  Abdominal:     General: Bowel sounds are normal. There is no distension.     Palpations: Abdomen is soft. There is no hepatomegaly, splenomegaly or mass.     Tenderness: There is no abdominal tenderness.  Musculoskeletal:        General: Normal range of motion.      Cervical back: Neck supple.     Right lower leg: No edema.     Left lower leg: No edema.  Lymphadenopathy:     Cervical: No cervical adenopathy.  Skin:    General: Skin is warm and dry.  Neurological:     Mental Status: She is alert and oriented to person, place, and time.     Cranial Nerves: No cranial nerve deficit.     Gait: Gait normal.     Deep Tendon Reflexes: Reflexes are normal and symmetric.  Psychiatric:        Mood and Affect: Mood normal.        Behavior: Behavior normal.      Hearing Screening   125Hz  250Hz  500Hz  1000Hz  2000Hz  3000Hz  4000Hz  6000Hz  8000Hz   Right ear:           Left ear:             Visual Acuity Screening   Right eye Left eye Both eyes  Without correction: 20/15 20/15 20/15   With correction:       No results found for this or any previous visit (from the past 24 hour(s)).  No results found.   ASSESSMENT and PLAN  1. Fatigue, unspecified type Labs pending, discussed importance of sleep, healthy diet and regular exercise.  - CBC - Comprehensive metabolic panel - TSH  2. BMI 26.0-26.9,adult Discussed LFM, myplate method, discussed online resources  3. Episodic tension-type headache, not intractable Discussed stress management, cont APAP prn.   4. Stress and adjustment reaction - Ambulatory referral to Psychology  Return in about 6 months (around 07/26/2020).    Rutherford Guys, MD [facility] [place], [place] [address] Ph.  [phone] Fax [phone]

## 2020-01-27 NOTE — Patient Instructions (Addendum)
If you have lab work done today you will be contacted with your lab results within the next 2 weeks.  If you have not heard from [place] then please contact us. The fastest way to get your results is to register for My Chart.   IF you received an x-ray today, you will receive an invoice from [facility]. Please contact [facility] at [phone] with questions or concerns regarding your invoice.   IF you received labwork today, you will receive an invoice from [facility]. Please contact [facility] at [phone] with questions or concerns regarding your invoice.   Our billing staff will not be able to assist you with questions regarding bills from these companies.  You will be contacted with the lab results as soon as they are available. The fastest way to get your results is to activate your My Chart account. Instructions are located on the last page of this paperwork. If you have not heard from [place] regarding the results in 2 weeks, please contact this office.     MyPlate from [employer] is an outline of a general healthy diet based on the 2010 Dietary Guidelines for Americans, from the U.S. Department of Agriculture Scientist, research (physical sciences)). It sets guidelines for how To follow MyPlate recommendations:  Eat a wide variety of fruits and vegetables, grains, and protein foods.  Serve smaller portions and eat less food throughout the day.  Limit portion sizes to avoid overeating.  Enjoy your food.  Get at least 150 minutes of exercise every week. This is about 30 minutes each day, 5 or more days per week. It can be difficult to have every meal look like MyPlate. Think about MyPlate as eating guidelines for an entire day, rather than each individual meal. Fruits and vegetables  Make half of your plate fruits and vegetables.  Eat many different colors of fruits and vegetables each day.  For a 2,000 calorie daily food plan, eat: ? 2 cups of vegetables every day. ? 2 cups of fruit  every day.  1 cup is equal to: ? 1 cup raw or cooked vegetables. ? 1 cup raw fruit. ? 1 medium-sized orange, apple, or banana. ? 1 cup 100% fruit or vegetable juice. ? 2 cups raw leafy greens, such as lettuce, spinach, or kale. ?  cup dried fruit. Grains  One fourth of your plate should be grains.  Make at least half of the grains you eat each day whole grains.  For a 2,000 calorie daily food plan, eat 6 oz of grains every day.  1 oz is equal to: ? 1 slice bread. ? 1 cup cereal. ?  cup cooked rice, cereal, or pasta. Protein  One fourth of your plate should be protein.  Eat a wide variety of protein foods, including meat, poultry, fish, eggs, beans, nuts, and tofu.  For a 2,000 calorie daily food plan, eat 5 oz of protein every day.  1 oz is equal to: ? 1 oz meat, poultry, or fish. ?  cup cooked beans. ? 1 egg. ?  oz nuts or seeds. ? 1 Tbsp peanut butter. Dairy  Drink fat-free or low-fat (1%) milk.  Eat or drink dairy as a side to meals.  For a 2,000 calorie daily food plan, eat or drink 3 cups of dairy every day.  1 cup is equal to: ? 1 cup milk, yogurt, cottage cheese, or soy milk (soy beverage). ? 2 oz processed cheese. ? 1 oz natural cheese. Fats, oils,  salt, and sugars  Only small amounts of oils are recommended.  Avoid foods that are high in calories and low in nutritional value (empty calories), like foods high in fat or added sugars.  Choose foods that are low in salt (sodium). Choose foods that have less than 140 milligrams (mg) of sodium per serving.  Drink water instead of sugary drinks. Drink enough water each day to keep your urine pale yellow. Where to find support  Work with your health care provider or a nutrition specialist (dietitian) to develop a customized eating plan that is right for you.  Download an app (mobile application) to help you track your daily food intake. Where to find more information  Go to [email or web address] for  more information. Summary  MyPlate is a general guideline for healthy eating from the USDA. It is based on the 2010 Dietary Guidelines for Americans.  In general, fruits and vegetables should take up  of your plate, grains should take up  of your plate, and protein should take up  of your plate. This information is not intended to replace advice given to you by your health care provider. Make sure you discuss any questions you have with your health care provider. Document Revised: 05/12/2019 Document Reviewed: 03/09/2017 Elsevier Patient Education  [place].

## 2020-01-28 LAB — COMPREHENSIVE METABOLIC PANEL
ALT: 30 IU/L (ref 0–32)
AST: 26 IU/L (ref 0–40)
Albumin/Globulin Ratio: 1.5 (ref 1.2–2.2)
Albumin: 4.4 g/dL (ref 3.9–5.0)
Alkaline Phosphatase: 110 IU/L (ref 39–117)
BUN/Creatinine Ratio: 11 (ref 9–23)
BUN: 8 mg/dL (ref 6–20)
Bilirubin Total: 1.9 mg/dL — ABNORMAL HIGH (ref 0.0–1.2)
CO2: 20 mmol/L (ref 20–29)
Calcium: 9.3 mg/dL (ref 8.7–10.2)
Chloride: 103 mmol/L (ref 96–106)
Creatinine, Ser: 0.75 mg/dL (ref 0.57–1.00)
GFR calc Af Amer: 130 mL/min/{1.73_m2} (ref >59)
GFR calc non Af Amer: 113 mL/min/{1.73_m2} (ref >59)
Globulin, Total: 2.9 g/dL (ref 1.5–4.5)
Glucose: 78 mg/dL (ref 65–99)
Potassium: 4 mmol/L (ref 3.5–5.2)
Sodium: 138 mmol/L (ref 134–144)
Total Protein: 7.3 g/dL (ref 6.0–8.5)

## 2020-01-28 LAB — CBC
Hematocrit: 44.4 % (ref 34.0–46.6)
Hemoglobin: 14.7 g/dL (ref 11.1–15.9)
MCH: 29.9 pg (ref 26.6–33.0)
MCHC: 33.1 g/dL (ref 31.5–35.7)
MCV: 90 fL (ref 79–97)
Platelets: 374 10*3/uL (ref 150–450)
RBC: 4.91 x10E6/uL (ref 3.77–5.28)
RDW: 12.5 % (ref 11.7–15.4)
WBC: 7.8 10*3/uL (ref 3.4–10.8)

## 2020-01-28 LAB — TSH: TSH: 0.597 u[IU]/mL (ref 0.450–4.500)

## 2020-02-01 ENCOUNTER — Encounter: Payer: Self-pay | Admitting: Family Medicine

## 2020-02-02 NOTE — Telephone Encounter (Signed)
Results posted on mychart

## 2020-05-11 ENCOUNTER — Encounter: Payer: Self-pay | Admitting: Family Medicine

## 2020-06-11 ENCOUNTER — Other Ambulatory Visit: Payer: Self-pay | Admitting: Family Medicine

## 2020-06-11 DIAGNOSIS — Z3201 Encounter for pregnancy test, result positive: Secondary | ICD-10-CM

## 2020-06-12 ENCOUNTER — Other Ambulatory Visit: Payer: Self-pay

## 2020-06-12 DIAGNOSIS — Z3201 Encounter for pregnancy test, result positive: Secondary | ICD-10-CM

## 2020-06-13 LAB — OBSTETRIC PANEL, INCLUDING HIV
Antibody Screen: NEGATIVE
Basophils Absolute: 0 10*3/uL (ref 0.0–0.2)
Basos: 0 %
EOS (ABSOLUTE): 0.1 10*3/uL (ref 0.0–0.4)
Eos: 1 %
HIV Screen 4th Generation wRfx: NONREACTIVE
Hematocrit: 40.5 % (ref 34.0–46.6)
Hemoglobin: 13.4 g/dL (ref 11.1–15.9)
Hepatitis B Surface Ag: NEGATIVE
Immature Grans (Abs): 0 10*3/uL (ref 0.0–0.1)
Immature Granulocytes: 1 %
Lymphocytes Absolute: 1.8 10*3/uL (ref 0.7–3.1)
Lymphs: 23 %
MCH: 30.5 pg (ref 26.6–33.0)
MCHC: 33.1 g/dL (ref 31.5–35.7)
MCV: 92 fL (ref 79–97)
Monocytes Absolute: 0.4 10*3/uL (ref 0.1–0.9)
Monocytes: 5 %
Neutrophils Absolute: 5.4 10*3/uL (ref 1.4–7.0)
Neutrophils: 70 %
Platelets: 281 10*3/uL (ref 150–450)
RBC: 4.39 x10E6/uL (ref 3.77–5.28)
RDW: 13 % (ref 11.7–15.4)
RPR Ser Ql: NONREACTIVE
Rh Factor: POSITIVE
Rubella Antibodies, IGG: 1.69 index (ref >0.99)
WBC: 7.7 10*3/uL (ref 3.4–10.8)

## 2020-06-13 LAB — HGB SOLU + RFLX FRAC: Sickle Solubility Test - HGBRFX: NEGATIVE

## 2020-06-19 ENCOUNTER — Other Ambulatory Visit (HOSPITAL_COMMUNITY)
Admission: RE | Admit: 2020-06-19 | Discharge: 2020-06-19 | Disposition: A | Discharge status: Home / Self Care | Payer: Self-pay | Source: Ambulatory Visit | Attending: Family Medicine | Admitting: Family Medicine

## 2020-06-19 ENCOUNTER — Other Ambulatory Visit: Payer: Self-pay

## 2020-06-19 ENCOUNTER — Ambulatory Visit (INDEPENDENT_AMBULATORY_CARE_PROVIDER_SITE_OTHER): Payer: Self-pay | Admitting: Family Medicine

## 2020-06-19 ENCOUNTER — Encounter: Payer: Self-pay | Admitting: Family Medicine

## 2020-06-19 VITALS — BP 100/70 | HR 75 | Wt 169.0 lb

## 2020-06-19 DIAGNOSIS — Z3201 Encounter for pregnancy test, result positive: Secondary | ICD-10-CM

## 2020-06-19 DIAGNOSIS — O020 Blighted ovum and nonhydatidiform mole: Secondary | ICD-10-CM

## 2020-06-19 DIAGNOSIS — Z3480 Encounter for supervision of other normal pregnancy, unspecified trimester: Secondary | ICD-10-CM | POA: Insufficient documentation

## 2020-06-19 DIAGNOSIS — O0992 Supervision of high risk pregnancy, unspecified, second trimester: Secondary | ICD-10-CM | POA: Insufficient documentation

## 2020-06-19 NOTE — Assessment & Plan Note (Signed)
Obtaining transvaginal dating ultrasound to evaluate placental placement. Should consider sending placenta for pathology at delivery.

## 2020-06-19 NOTE — Patient Instructions (Addendum)
Thank you so much for coming to see [place] today and congratulations!  We have scheduled you for an ultrasound.  Please be sure to attend this as scheduled.  I will call you with the results.  Please follow-up with me on 07/12/2020 at 9:50 AM for your follow-up OB visit.  Please be sure to continue to take your prenatal vitamin daily.  Avoid tobacco and alcohol. As discussed you need to avoid cat litter, deli meats, and soft cheeses.  You may use google.com to learn more about which of these things to avoid.  If you have any further questions please do not hesitate to call and I will be glad to help answer them.  Safe Medications in Pregnancy   Acne: Benzoyl Peroxide Salicylic Acid  Backache/Headache: Tylenol: 2 regular strength every 4 hours OR              2 Extra strength every 6 hours  Colds/Coughs/Allergies: Benadryl (alcohol free) 25 mg every 6 hours as needed Breath right strips Claritin Cepacol throat lozenges Chloraseptic throat spray Cold-Eeze- up to three times per day Cough drops, alcohol free Flonase (by prescription only) Guaifenesin Mucinex Robitussin DM (plain only, alcohol free) Saline nasal spray/drops Sudafed (pseudoephedrine) & Actifed ** use only after [redacted] weeks gestation and if you do not have high blood pressure Tylenol Vicks Vaporub Zinc lozenges Zyrtec   Constipation: Colace Ducolax suppositories Fleet enema Glycerin suppositories Metamucil Milk of magnesia Miralax Senokot Smooth move tea  Diarrhea: Kaopectate Imodium A-D  *NO pepto Bismol  Hemorrhoids: Anusol Anusol HC Preparation H Tucks  Indigestion: Tums Maalox Mylanta Zantac  Pepcid  Insomnia: Benadryl (alcohol free) 25mg  every 6 hours as needed Tylenol PM Unisom, no Gelcaps  Leg Cramps: Tums MagGel  Nausea/Vomiting:  Bonine Dramamine Emetrol Ginger extract Sea bands Meclizine  Nausea medication to take during pregnancy:  Unisom (doxylamine succinate 25 mg  tablets) Take one tablet daily at bedtime. If symptoms are not adequately controlled, the dose can be increased to a maximum recommended dose of two tablets daily (1/2 tablet in the morning, 1/2 tablet mid-afternoon and one at bedtime). Vitamin B6 100mg  tablets. Take one tablet twice a day (up to 200 mg per day).  Skin Rashes: Aveeno products Benadryl cream or 25mg  every 6 hours as needed Calamine Lotion 1% cortisone cream  Yeast infection: Gyne-lotrimin 7 Monistat 7   **If taking multiple medications, please check labels to avoid duplicating the same active ingredients **take medication as directed on the label ** Do not exceed 4000 mg of tylenol in 24 hours **Do not take medications that contain aspirin or ibuprofen

## 2020-06-19 NOTE — Progress Notes (Signed)
Patient Name: Erica Hall Date of Birth: 28-Jul-1996 [place] Initial Prenatal   Erica Hall is a 24 y.o. year old G2P0010 at Unknown who presents for her initial prenatal visit. Pregnancy  isplanned She reports breast tenderness, nausea and positive home pregnancy test. She  is taking a prenatal vitamin.  She denies pelvic pain or vaginal bleeding.   The patient is dated by LMP.  LMP: 3/81/8299 Period is certain:  Yes.  Periods are regular:  Yes.  Period was typical period:  Yes.  Using hormonal contraception in 3 months prior to conception:  No   Lab Review: Blood type: A Rh Status: + Antibody screen Negative if positive, discuss with Ob on call  HIV Negative RPR Negative Hemoglobin electrophoresis reviewed Yes Results of Ob urine culture are Not done. Obtained today. Rubella Immune Varicella status is Immune per patient. Will obtain titer to confirm.   PMH: Reviewed and as detailed below: HTN: No  Type 1 or 2 Diabetes: No  Depression:  No  Seizure disorder:  No  VTE: No  History of STI No Abnormal Pap smear:  No, Genital herpes simplex:  No   PSH: Gynecologic Surgery:  Yes - dilation and evacuation on 12/30/2017  - Retroverted uterus Surgical history reviewed.   Obstetric History: Obstetric history tab updated and reviewed.  Cesarean delivery: No if yes, schedule with Dr. 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   Social  History: Partner's name: Kathy Breach  Tobacco use: No Alcohol use:  No (yes prior to pregnancy) Other substance use:  No  Current Medications:  Prenatal Vitamin  Reviewed and appropriate in pregnancy.   Genetic and Infection Screen: Flow Sheet Updated Yes  Prenatal Exam: Gen: Well nourished, well developed.  No distress.  Vitals noted. HEENT: Normocephalic, atraumatic.  Neck supple, normal ROM  Resp: breathing comfortably on room air, speaking in full sentences Abd: soft, NTND.  Uterus not appreciated above pelvis. GU: Normal external female genitalia without lesions.  Nl vaginal, well rugated without lesions. No vaginal discharge. Cervix with ectropion and friability, small mucous plug present. Papsmear obtained.   Chaperoned by Glori Bickers Ext: No clubbing, cyanosis or edema. Psych: Normal grooming and dress.  Not depressed or anxious appearing.  Normal thought content and process without flight of ideas or looseness of associations  Assessment/plan: 1) Prenatal Care: . Pregnancy dating based on LMP at this time. Dating ultrasound ordered.  . Current pregnancy issues include: o History of Molar Pregnancy: will obtain dating ultrasound with transvaginal to view placental placement. Will follow up. o Consider placental pathology at delivery . Prepregnancy weight updated and expected weight gain this pregnancy is 15-25 pounds . Prenatal labs reviewed, notable for all normal results. Papsmear, GC/Cl, and Varicella titer obtained today.  . Indications for referral to HROB were reviewed and the patient does not meet criteria for referral.   . Medication list reviewed and updated to include only medications current medications.  . Recommended patient see a dentist for regular care.  Marland Kitchen  Bleeding and pain precautions reviewed. . Importance of prenatal vitamins reviewed.  . Genetic screening offered, including first trimester screen with nuchal translucency at 11-13 weeks or quad screen  at 16-20 weeks.  o Patient is interested in genetic testing. Given [facility], will order Quad screen at follow up visit . The patient is not age 65 or over at time of delivery. Referral to genetics was not offered today.  . The patient does not meet criteria for an early 1 hour glucola testing for diabetes. She will undergo routine 1 hour screening at 24-28 weeks.  Marland Kitchen PMH and PHQ9 forms completed.  . Early glucola is not indicated.   Anticipatory Guidance and Prenatal Education provided on the following topics: - Reviewed nutrition in pregnancy, avoiding cat litter, unpasteurized cheeses - Recommended continuing Prenatal vitamin   - reviewed safe medications to take during pregnancy - Problem list and prenatal box updated    Follow up 4 weeks for next prenatal visit.  Mina Marble, DO [facility], PGY2 06/19/2020

## 2020-06-20 LAB — CERVICOVAGINAL ANCILLARY ONLY
Chlamydia: NEGATIVE
Comment: NEGATIVE
Comment: NORMAL
Neisseria Gonorrhea: NEGATIVE

## 2020-06-20 LAB — CYTOLOGY - PAP: Diagnosis: NEGATIVE

## 2020-06-20 LAB — VARICELLA ZOSTER ANTIBODY, IGG: Varicella zoster IgG: 670 index (ref >165)

## 2020-06-21 ENCOUNTER — Telehealth: Payer: Self-pay | Admitting: *Deleted

## 2020-06-21 LAB — URINE CULTURE, OB REFLEX: Organism ID, Bacteria: NO GROWTH

## 2020-06-21 LAB — CULTURE, OB URINE

## 2020-06-21 NOTE — Telephone Encounter (Signed)
-----   Message from Danna Hefty, [place] sent at 06/20/2020  5:22 PM EDT ----- Please inform patient her pap-smear and labs were normal.

## 2020-06-21 NOTE — Telephone Encounter (Signed)
Pt informed. Fianna Snowball, CMA  

## 2020-07-05 ENCOUNTER — Other Ambulatory Visit: Payer: Self-pay

## 2020-07-05 ENCOUNTER — Ambulatory Visit (INDEPENDENT_AMBULATORY_CARE_PROVIDER_SITE_OTHER): Payer: Self-pay | Admitting: Family Medicine

## 2020-07-05 ENCOUNTER — Encounter: Payer: Self-pay | Admitting: Family Medicine

## 2020-07-05 VITALS — BP 118/72 | HR 79 | Wt 169.2 lb

## 2020-07-05 DIAGNOSIS — G44229 Chronic tension-type headache, not intractable: Secondary | ICD-10-CM

## 2020-07-05 DIAGNOSIS — Z3480 Encounter for supervision of other normal pregnancy, unspecified trimester: Secondary | ICD-10-CM

## 2020-07-05 NOTE — Progress Notes (Signed)
  Patient Name: Erica Hall Date of Birth: 03/20/1996 Date of Visit: 07/05/20 PCP: Patient, No Pcp Per First Trimester Prenatal Visit   Chief Complaint: prenatal care  Subjective: Erica Hall is a pleasant G2P0010 dated by LMP. She has canceled her previously scheduled dating ultrasound. She notes she had to reschedule due to personal reasons and has a lot of trouble getting the appointment rescheduled.  She has no unusual complaints today.  She denies vaginal bleeding or discharge.   Concerns:  Headaches: Intermittent and chronic that was present prior to pregnancy. She has not treated it with anything. Described as posterior head pain and sometimes feels like a "band around the head". Does seem to improve with caffeine. Does not she was a very heavy caffeine drinker prior to pregnancy.   ROS: Per HPI.   I have reviewed the patient's medical, surgical, family, and social history as appropriate.   Prior Obstetric History: History of molar pregancy  Complications of Current Pregnancy: A positive, rubella immune, Varicella immune  Objective: Vitals:   07/05/20 1121  BP: 118/72  Pulse: 79   Last Weight  Most recent update: 07/05/2020 11:21 AM   Weight  76.7 kg (169 lb 3.2 oz)           Fetal heart tones document in flowsheet  FHR: 145  General:  Alert, oriented and cooperative. Patient is in no acute distress.  Skin: Skin is warm and dry. No rash noted.   Cardiovascular: Normal heart rate noted  Respiratory: Normal respiratory effort, no problems with respiration noted  Abdomen: Soft, gravid, appropriate for gestational age.  Pain/Pressure: Absent     Pelvic: Cervical exam deferred        Extremities: Normal range of motion.     Mental Status: Normal mood and affect. Normal behavior. Normal judgment and thought content.    Assessment/Plan: Erica Hall is a pleasant G2P0010 at Unknown presenting for routine first trimester prenatal care. Overall  she is doing well.  -  Pregnancy issues include: History of molar pregnancy. - Anatomy ultrasound ordered to be scheduled at 18-19 weeks - Quad screen/AFP ordered  #1: History of Molar Pregnancy:    - established problem. 2019. S/p dilatation/evacuation  - patient has not completed transvaginal ultrasound for evaluation as recommended  - Anatomy ultrasound scheduled at [facility] department on 07/23/20  - consider sending placenta for path at delivery   - [facility] were [facility]   #2: Tension Headache: Acute on chronic. Established problem. Symptoms appear most consistent with tension type headache in setting of prior heavy caffeine use.  - Tylenol PRN for headache - Patient may try 200mg /day of caffeine - can consider Fioricet if limited improvement with above - patient advised to avoid NSAIDs  Anticipatory Guidance and Prenatal Education provided on the following topics: - Discussed genetic testing options reviewed - Reviewed nutrition in pregnancy, avoiding cat litter, unpasteurized cheeses - Recommended Prenatal vitamin  - Discussed options for Contraception postpartum - Problem list and prenatal box updated   Follow up in 4 weeks.   Mina Marble, [place], PGY3 07/05/2020 4:45 PM

## 2020-07-12 ENCOUNTER — Encounter: Payer: Self-pay | Admitting: Family Medicine

## 2020-07-17 ENCOUNTER — Telehealth: Payer: Self-pay

## 2020-07-17 NOTE — Telephone Encounter (Signed)
Patient LVM on nurse line requesting genetic testing results. Please advise.

## 2020-07-19 ENCOUNTER — Encounter: Payer: Self-pay | Admitting: Family Medicine

## 2020-07-19 NOTE — Telephone Encounter (Signed)
Called patient with results. Genetic quad screen negative. Results to be scanned into chart.  Patient had not other questions or concerns.

## 2020-07-23 ENCOUNTER — Encounter: Payer: Self-pay | Admitting: Family Medicine

## 2020-07-27 ENCOUNTER — Ambulatory Visit: Payer: No Typology Code available for payment source | Admitting: Family Medicine

## 2020-08-02 ENCOUNTER — Other Ambulatory Visit: Payer: Self-pay

## 2020-08-02 ENCOUNTER — Ambulatory Visit (INDEPENDENT_AMBULATORY_CARE_PROVIDER_SITE_OTHER): Payer: Self-pay | Admitting: Family Medicine

## 2020-08-02 ENCOUNTER — Encounter: Payer: Self-pay | Admitting: Family Medicine

## 2020-08-02 VITALS — BP 110/58 | HR 79 | Wt 172.4 lb

## 2020-08-02 DIAGNOSIS — M25475 Effusion, left foot: Secondary | ICD-10-CM

## 2020-08-02 DIAGNOSIS — M25471 Effusion, right ankle: Secondary | ICD-10-CM

## 2020-08-02 DIAGNOSIS — M25474 Effusion, right foot: Secondary | ICD-10-CM

## 2020-08-02 DIAGNOSIS — M25472 Effusion, left ankle: Secondary | ICD-10-CM

## 2020-08-02 DIAGNOSIS — Z3480 Encounter for supervision of other normal pregnancy, unspecified trimester: Secondary | ICD-10-CM

## 2020-08-02 NOTE — Progress Notes (Signed)
Patient Name: Erica Hall Date of Birth: 1996-01-06 Date of Visit: 08/03/20 PCP: Patient, No Pcp Per Second Trimester Prenatal Visit   Chief Complaint: prenatal care  Subjective: Erica Hall is a pleasant G2P0010 at  [redacted]w[redacted]d dated by LMP (c/w 7w [place]). She has no unusual complaints today.  She denies vaginal bleeding or discharge. She reports she is starting to feel fetal movement. Does note some bilateral LE swelling after her road trip from [place]. She notes that they initially became swollen when going to [place] but improved after a few days. She returned from [place] on 8/27. She notes that now she gets intermittent swelling depending on the shoes she wears or standing too long. She checks her blood pressures at home and they are always below <120/80. Denies any chest pain or SOB.   ROS: Per HPI.   I have reviewed the patient's medical, surgical, family, and social history as appropriate.   Prior Obstetric History:  History of molar pregancy  Complications of Current Pregnancy: None  Objective: Vitals:   08/02/20 0910  BP: (!) 110/58  Pulse: 79   Last Weight  Most recent update: 08/02/2020  9:11 AM   Weight  78.2 kg (172 lb 6.4 oz)           See prenatal flowsheet for exam.  Fetal heart tones documented in flow sheet  Fundal height documented in flow sheet   General:  Alert, oriented and cooperative. Patient is in no acute distress.  Skin: Skin is warm and dry. No rash noted.   Cardiovascular: Normal heart rate noted  Respiratory: Normal respiratory effort, no problems with respiration noted  Abdomen: Soft, gravid, appropriate for gestational age.  Pain/Pressure: Absent     Pelvic: Cervical exam deferred        Extremities: Normal range of motion.  Edema: Tracetrace LE swelling   Mental Status: Normal mood and affect. Normal behavior. Normal judgment and thought content.    Assessment/Plan: Erica Hall is a pleasant G2P0010 at [redacted]w[redacted]d presenting  for routine second trimester prenatal care. Overall she is doing well.   . Dating reviewed, dating tab is correct . Fetal heart tones Appropriate . Fundal height within expected range.  . Pregnancy issues include: History of molar pregnancy  . Genetic screen: Quad screen negative . Anatomy ultrasound reviewed and normal . Problem list updated Yes.  . Influenza vaccine not administered as not influenza season. .  . Indications for screening for preexisting diabetes include: BMI > 25 and high risk ethnicity (Latino, [place] American, Native American, [place], Asian Optometrist) .  Marland Kitchen Pregnancy education provided on the following topics: fetal growth and movement, ultrasound assessment, and upcoming laboratory assessment.   . Scheduled for [facility] during second trimester on 08/30/20 . Preterm labor precautions given.    #1: Bilateral LE Swelling: Occurs after prolonged standing or car rides, improves with elevation.  - No signs of DVT. Trace edema today. BP well controlled.  - Discussed strict return precautions including worsening LE swelling or elevated BP's of 140/90. Recommended documenting BP if they start to increase in the 130's/80's.   #2: Early GTT: Scheduled for 08/06/20. Will follow up with next steps pending results. If normal, will need repeat at regularly scheduled interval at 24-28w  #3: Anatomy Scan Scan performed at health department. Results normal and scanned in media.   Anticipatory Guidance and Prenatal Education provided on the following topics: - Reviewed nutrition in pregnancy, avoiding cat litter, unpasteurized cheeses -  Recommended continuing Prenatal vitamin  - Discussed options for Contraception postpartum - undecided, wants to continue to look over options - Discussed mode of delivery - vaginal  - Discussed pain control in labor - unsure - Discussed  breastfeeding and benefits for infant - breast feed  - Problem list and prenatal box updated    Follow up in 4 weeks.   Mina Marble, [place], PGY2 08/03/2020 2:34 PM

## 2020-08-02 NOTE — Patient Instructions (Signed)
For your swelling, try to elevate your legs as often as possible.  You can purchase compression stockings over the counter of ~35mmHg to wear during the day when on your feet.  Wear comfortable shoes Monitor blood pressure and call if you get elevated BP's over 140/90.  Keep a log if your blood pressure start to creep up.  We are scheduling you for a early gestational diabetes screen. I will call you with the results.  Please follow up with our OB clinic on 08/30/20 at [place]: - Drink something cold or sugary - lie in a quiet place for 1 hour.  - count kicks for 1 hour  - Should have 10 kicks in 1 hour - Instructed pt that if she feels less than 5 kicks in this time she should go to the MAU for evaluation  Reasons to return to MAU:  1.  Contractions are  5 minutes apart or less, each last 1 minute, these have been going on for 1-2 hours, and you cannot walk or talk during them 2.  You have a large gush of fluid, or a trickle of fluid that will not stop and you have to wear a pad 3.  You have bleeding that is bright red, heavier than spotting--like menstrual bleeding (spotting can be normal in early labor or after a check of your cervix) 4.  You do not feel the baby moving like he/she normally does

## 2020-08-03 DIAGNOSIS — M25475 Effusion, left foot: Secondary | ICD-10-CM | POA: Insufficient documentation

## 2020-08-03 NOTE — Assessment & Plan Note (Signed)
Occurs after prolonged standing or car rides, improves with elevation.  - No signs of DVT. Trace edema today. BP well controlled.  - Discussed strict return precautions including worsening LE swelling or elevated BP's of 140/90. Recommended documenting BP if they start to increase in the 130's/80's.

## 2020-08-06 ENCOUNTER — Other Ambulatory Visit (INDEPENDENT_AMBULATORY_CARE_PROVIDER_SITE_OTHER): Payer: Self-pay

## 2020-08-06 ENCOUNTER — Other Ambulatory Visit: Payer: Self-pay

## 2020-08-06 DIAGNOSIS — Z3480 Encounter for supervision of other normal pregnancy, unspecified trimester: Secondary | ICD-10-CM

## 2020-08-06 LAB — POCT 1 HR PRENATAL GLUCOSE: Glucose 1 Hr Prenatal, POC: 176 mg/dL

## 2020-08-10 ENCOUNTER — Other Ambulatory Visit: Payer: Self-pay

## 2020-08-10 DIAGNOSIS — Z3480 Encounter for supervision of other normal pregnancy, unspecified trimester: Secondary | ICD-10-CM

## 2020-08-11 ENCOUNTER — Telehealth: Payer: Self-pay | Admitting: Family Medicine

## 2020-08-11 DIAGNOSIS — O24419 Gestational diabetes mellitus in pregnancy, unspecified control: Secondary | ICD-10-CM

## 2020-08-11 LAB — GESTATIONAL GLUCOSE TOLERANCE
Glucose, Fasting: 83 mg/dL (ref 65–94)
Glucose, GTT - 1 Hour: 182 mg/dL — ABNORMAL HIGH (ref 65–179)
Glucose, GTT - 2 Hour: 159 mg/dL — ABNORMAL HIGH (ref 65–154)
Glucose, GTT - 3 Hour: 127 mg/dL (ref 65–139)

## 2020-08-11 NOTE — Telephone Encounter (Signed)
1-hour and 3-hour glucola tests were both abnormal.  She endorsed hx of DM2 in her maternal grandparents as well as gestational DM in her mother.  I discussed gestational DM as an indication for referral to [facility]. She has an upcoming appointment with Dr. Higinio Plan. I advised her to keep that appointment while awaiting OB/Gyn appointment.  Referral placed.  I have copied Dr. Tarry Kos, who ordered this test, and Dr. Higinio Plan, who sees her next week on this report.  All questions were answered, and she verbalized understanding of the instructions given.

## 2020-08-21 ENCOUNTER — Ambulatory Visit (INDEPENDENT_AMBULATORY_CARE_PROVIDER_SITE_OTHER): Payer: Self-pay | Admitting: Family Medicine

## 2020-08-21 ENCOUNTER — Other Ambulatory Visit: Payer: Self-pay

## 2020-08-21 ENCOUNTER — Encounter: Payer: Self-pay | Admitting: Family Medicine

## 2020-08-21 VITALS — BP 118/70 | HR 70 | Wt 175.2 lb

## 2020-08-21 DIAGNOSIS — Z3480 Encounter for supervision of other normal pregnancy, unspecified trimester: Secondary | ICD-10-CM

## 2020-08-21 DIAGNOSIS — O24319 Unspecified pre-existing diabetes mellitus in pregnancy, unspecified trimester: Secondary | ICD-10-CM

## 2020-08-21 DIAGNOSIS — O24410 Gestational diabetes mellitus in pregnancy, diet controlled: Secondary | ICD-10-CM

## 2020-08-21 LAB — POCT GLYCOSYLATED HEMOGLOBIN (HGB A1C): Hemoglobin A1C: 5 % (ref 4.0–5.6)

## 2020-08-21 MED ORDER — FREESTYLE SYSTEM KIT: PACK | 1 refills | Status: DC

## 2020-08-21 NOTE — Patient Instructions (Signed)
It was wonderful seeing you today!  Please start checking your sugar i 4 times a day including before you eat, and 2 hours after each meal.  This will help [place] get an idea of what your sugars have been looking like.  Fasting goal around 90 and 2 hours after meal around 120. Please ask the pharmacist to help you with your glucometer if you have any questions.  Keep a journal of these numbers. Follow-up with OB or sooner if needed.  Please go to the MAU if you have any leakage of fluid, vaginal bleeding, or decreased fetal movement.   Gestational Diabetes Mellitus, Diagnosis Gestational diabetes (gestational diabetes mellitus) is a short-term (temporary) form of diabetes that can happen during pregnancy. It goes away after you give birth. It may be caused by one or both of these problems:  Your pancreas does not make enough of a hormone called insulin.  Your body does not respond in a normal way to insulin that it makes. Insulin lets sugars (glucose) go into cells in the body. This gives you energy. If you have diabetes, sugars cannot get into cells. This causes high blood sugar (hyperglycemia). If you get gestational diabetes, you are:  More likely to get it if you get pregnant again.  More likely to develop type 2 diabetes in the future. If gestational diabetes is treated, it may not hurt you or your baby. Your doctor will set treatment goals for you. In general, you should have these blood sugar levels:  After not eating for a long time (fasting): 95 mg/dL (5.3 mmol/L).  After meals (postprandial): ? One hour after a meal: at or below 140 mg/dL (7.8 mmol/L). ? Two hours after a meal: at or below 120 mg/dL (6.7 mmol/L).  A1c (hemoglobin A1c) level: 6-6.5%. Follow these instructions at home: Questions to ask your doctor   You may want to ask these questions: ? Do I need to meet with a diabetes educator? ? What equipment will I need to care for myself at home? ? What medicines do I need?  When should I take them? ? How often do I need to check my blood sugar? ? What number can I call if I have questions? ? When is my next doctor's visit? General instructions  Take over-the-counter and prescription medicines only as told by your doctor.  Stay at a healthy weight during pregnancy.  Keep all follow-up visits as told by your doctor. This is important. Contact a doctor if:  Your blood sugar is at or above 240 mg/dL (13.3 mmol/L).  Your blood sugar is at or above 200 mg/dL (11.1 mmol/L) and you have ketones in your pee (urine).  You have been sick or have had a fever for 2 days or more and you are not getting better.  You have any of these problems for more than 6 hours: ? You cannot eat or drink. ? You feel sick to your stomach (nauseous). ? You throw up (vomit). ? You have watery poop (diarrhea). Get help right away if:  Your blood sugar is lower than 54 mg/dL (3 mmol/L).  You get confused.  You have trouble: ? Thinking clearly. ? Breathing.  Your baby moves less than normal.  You have any of these: ? Moderate or large ketone levels in your pee. ? Blood coming from your vagina. ? Unusual fluid coming from your vagina. ? Early contractions. These may feel like tightness in your belly. Summary  Gestational diabetes is a short-term  form of diabetes. It can happen while you are pregnant. It goes away after you give birth.  If gestational diabetes is treated, it may not hurt you or your baby. Your doctor will set treatment goals for you.  Keep all follow-up visits as told by your doctor. This is important. This information is not intended to replace advice given to you by your health care provider. Make sure you discuss any questions you have with your health care provider. Document Revised: 01/14/2018 Document Reviewed: 01/11/2016 Elsevier Patient Education  2020 [employer].

## 2020-08-21 NOTE — Progress Notes (Signed)
[facility] Prenatal Visit ° °Mikita Bouie is a 24 y.o. G2P0010 at [redacted]w[redacted]d here for routine follow up. She is dated by LMP consistent with early U/S.  She reports no complaints with exception of occasional sharp stabbing pain on the right pelvis area.  She reports good fetal movement. No bleeding, loss of fluid, contractions. See flow sheet for details. Monitoring BP at home, always around 115-120 systolic.  ° °Vitals:  ° 08/21/20 0854  °BP: 118/70  °Pulse: 70  °FHT: 140-145 bpm °Fundal height: 23 cm ° ° °A/P: Pregnancy at [redacted]w[redacted]d.  Doing well.   ° °Routine Pregnancy care:  °• Dating reviewed, dating tab is correct °• Fetal heart tones Appropriate °• Fundal height within expected range.  °• Pregnancy issues include gestational diabetes as discussed below. °• Anatomy ultrasound reviewed and unremarkable.  °• Problem list updated Yes.  °• Influenza vaccine not administered as not influenza season. .  °• Pregnancy education provided on the following topics: fetal growth and movement, ultrasound assessment, and upcoming laboratory assessment.   °• Preterm labor precautions given.  ° °Gestational diabetes: °Diagnosed on 8/16 with abnormal 3-hour GTT around 20 weeks, possible but less likely pre-existing DM.  Still at low risk for preeclampsia if not considering nulliparity (previous molar), will hold off on ASA 81 mg for now.  Discussed appropriate diabetic diet and will start glucose monitoring QID with a journal, called in glucometer kit. Fasting goal 90, 2-hour postprandial 120.  ° °Round ligament pain: °Provided reassurance.  Recommended pregnancy band.  ° °Follow-up already scheduled for 9/9 for glucose check in, otherwise follow-up with OB moving forward scheduled on 9/20.  MAU precautions discussed. ° °Samantha N Beard, DO ° °

## 2020-08-22 ENCOUNTER — Other Ambulatory Visit: Payer: Self-pay | Admitting: Family Medicine

## 2020-08-22 DIAGNOSIS — O24410 Gestational diabetes mellitus in pregnancy, diet controlled: Secondary | ICD-10-CM

## 2020-08-24 MED ORDER — FREESTYLE SYSTEM KIT: PACK | 1 refills | Status: DC

## 2020-08-24 NOTE — Addendum Note (Signed)
Addended by: Lona Millard C on: 08/24/2020 12:30 PM   Modules accepted: Orders

## 2020-08-30 ENCOUNTER — Ambulatory Visit (INDEPENDENT_AMBULATORY_CARE_PROVIDER_SITE_OTHER): Payer: Self-pay | Admitting: Family Medicine

## 2020-08-30 ENCOUNTER — Other Ambulatory Visit: Payer: Self-pay

## 2020-08-30 VITALS — BP 104/62 | HR 88 | Wt 175.0 lb

## 2020-08-30 DIAGNOSIS — O0992 Supervision of high risk pregnancy, unspecified, second trimester: Secondary | ICD-10-CM

## 2020-08-30 DIAGNOSIS — O24410 Gestational diabetes mellitus in pregnancy, diet controlled: Secondary | ICD-10-CM

## 2020-08-30 DIAGNOSIS — Z23 Encounter for immunization: Secondary | ICD-10-CM

## 2020-08-30 MED ORDER — INFLUENZA VAC SPLIT QUAD 0.5 ML IM SUSY: 0.5000 mL | PREFILLED_SYRINGE | Freq: Once | INTRAMUSCULAR | 0 refills | Status: AC

## 2020-08-30 MED ORDER — ACCU-CHEK GUIDE ME W/DEVICE KIT: 1.0000 | PACK | Freq: Every day | 0 refills | Status: DC

## 2020-08-30 MED ORDER — ACCU-CHEK GUIDE VI STRP: ORAL_STRIP | 12 refills | Status: DC

## 2020-08-30 NOTE — Patient Instructions (Addendum)
Please pick your glucose meter. Fasting goal of 90 and post 2 hr post-prandial 120. Follow up with nutritionist 09/04/20.  Follow up with OB 09/10/20.   You can go to [email or web address] to look for prenatal classes.  Please review handout for birth control options after baby.   Second Trimester of Pregnancy  The second trimester is from week 14 through week 27 (month 4 through 6). This is often the time in pregnancy that you feel your best. Often times, morning sickness has lessened or quit. You may have more energy, and you may get hungry more often. Your unborn baby is growing rapidly. At the end of the sixth month, he or she is about 9 inches long and weighs about 1 pounds. You will likely feel the baby move between 18 and 20 weeks of pregnancy. Follow these instructions at home: Medicines  Take over-the-counter and prescription medicines only as told by your doctor. Some medicines are safe and some medicines are not safe during pregnancy.  Take a prenatal vitamin that contains at least 600 micrograms (mcg) of folic acid.  If you have trouble pooping (constipation), take medicine that will make your stool soft (stool softener) if your doctor approves. Eating and drinking   Eat regular, healthy meals.  Avoid raw meat and uncooked cheese.  If you get low calcium from the food you eat, talk to your doctor about taking a daily calcium supplement.  Avoid foods that are high in fat and sugars, such as fried and sweet foods.  If you feel sick to your stomach (nauseous) or throw up (vomit): ? Eat 4 or 5 small meals a day instead of 3 large meals. ? Try eating a few soda crackers. ? Drink liquids between meals instead of during meals.  To prevent constipation: ? Eat foods that are high in fiber, like fresh fruits and vegetables, whole grains, and beans. ? Drink enough fluids to keep your pee (urine) clear or pale yellow. Activity  Exercise only as told by your doctor. Stop exercising  if you start to have cramps.  Do not exercise if it is too hot, too humid, or if you are in a place of great height (high altitude).  Avoid heavy lifting.  Wear low-heeled shoes. Sit and stand up straight.  You can continue to have sex unless your doctor tells you not to. Relieving pain and discomfort  Wear a good support bra if your breasts are tender.  Take warm water baths (sitz baths) to soothe pain or discomfort caused by hemorrhoids. Use hemorrhoid cream if your doctor approves.  Rest with your legs raised if you have leg cramps or low back pain.  If you develop puffy, bulging veins (varicose veins) in your legs: ? Wear support hose or compression stockings as told by your doctor. ? Raise (elevate) your feet for 15 minutes, 3-4 times a day. ? Limit salt in your food. Prenatal care  Write down your questions. Take them to your prenatal visits.  Keep all your prenatal visits as told by your doctor. This is important. Safety  Wear your seat belt when driving.  Make a list of emergency phone numbers, including numbers for family, friends, the hospital, and police and fire departments. General instructions  Ask your doctor about the right foods to eat or for help finding a counselor, if you need these services.  Ask your doctor about local prenatal classes. Begin classes before month 6 of your pregnancy.  Do not use hot tubs, steam  rooms, or saunas.  Do not douche or use tampons or scented sanitary pads.  Do not cross your legs for long periods of time.  Visit your dentist if you have not done so. Use a soft toothbrush to brush your teeth. Floss gently.  Avoid all smoking, herbs, and alcohol. Avoid drugs that are not approved by your doctor.  Do not use any products that contain nicotine or tobacco, such as cigarettes and e-cigarettes. If you need help quitting, ask your doctor.  Avoid cat litter boxes and soil used by cats. These carry germs that can cause birth  defects in the baby and can cause a loss of your baby (miscarriage) or stillbirth. Contact a doctor if:  You have mild cramps or pressure in your lower belly.  You have pain when you pee (urinate).  You have bad smelling fluid coming from your vagina.  You continue to feel sick to your stomach (nauseous), throw up (vomit), or have watery poop (diarrhea).  You have a nagging pain in your belly area.  You feel dizzy. Get help right away if:  You have a fever.  You are leaking fluid from your vagina.  You have spotting or bleeding from your vagina.  You have severe belly cramping or pain.  You lose or gain weight rapidly.  You have trouble catching your breath and have chest pain.  You notice sudden or extreme puffiness (swelling) of your face, hands, ankles, feet, or legs.  You have not felt the baby move in over an hour.  You have severe headaches that do not go away when you take medicine.  You have trouble seeing. Summary  The second trimester is from week 14 through week 27 (months 4 through 6). This is often the time in pregnancy that you feel your best.  To take care of yourself and your unborn baby, you will need to eat healthy meals, take medicines only if your doctor tells you to do so, and do activities that are safe for you and your baby.  Call your doctor if you get sick or if you notice anything unusual about your pregnancy. Also, call your doctor if you need help with the right food to eat, or if you want to know what activities are safe for you. This information is not intended to replace advice given to you by your health care provider. Make sure you discuss any questions you have with your health care provider. Document Revised: 04/01/2019 Document Reviewed: 01/13/2017 Elsevier Patient Education  [place].

## 2020-08-30 NOTE — Progress Notes (Signed)
  [place] Prenatal Visit  Taiwana Margorie Renner is a 24 y.o. G2P0010 at [redacted]w[redacted]d here for routine follow up. She is dated by LMP.  She reports increased resting heart rate on 2 occasions reaching 123 and 130. Self resolved..  She reports good fetal movement. No bleeding, loss of fluid, contractions. See flow sheet for details. Vitals:   08/30/20 0914  BP: 104/62  Pulse: 88   A/P: Pregnancy at [redacted]w[redacted]d.  Doing well.   . Dating reviewed, dating tab is correct . Fetal heart tones Appropriate . Fundal height within expected range.  . Anatomy ultrasound reviewed and was unremarkable.  . Influenza vaccine Rx given for patient to take to pharmacy.   Marland Kitchen COVID vaccination was discussed and patient is fully vaccinated.  . Indications for screening for preexisting diabetes include: BMI > 25 and high risk ethnicity (Latino, African American, Native American, [place], Asian American) ; patient is A1GDM.  Marland Kitchen Pregnancy education provided on the following topics: fetal growth and movement, ultrasound assessment, and upcoming laboratory assessment.   . [facility] today. . Preterm labor precautions given.   2. Pregnancy issues include the following and were addressed as appropriate today:  . A1GM:  - Fasting glucose unknown.   - Patient unable to obtain glucose meter for financial reason.   - Accu-Check meter and strips sent to pharmacy; patient should be able  to obtain this free of charge  - Diabetic nutritionist appt. 09/04/20  -OBGYN appt 09/10/20; will complete remainder of [place]   Incident of elevated HR:  -HR wnl in clinic today  -Continue to monitor  -Discussed increasing hydration status  . Problem list and pregnancy box updated: Yes.    Follow up 4 weeks.

## 2020-09-04 ENCOUNTER — Other Ambulatory Visit: Payer: Self-pay

## 2020-09-06 ENCOUNTER — Other Ambulatory Visit: Payer: Self-pay

## 2020-09-06 ENCOUNTER — Ambulatory Visit: Payer: Self-pay | Admitting: Registered"

## 2020-09-06 DIAGNOSIS — O24419 Gestational diabetes mellitus in pregnancy, unspecified control: Secondary | ICD-10-CM

## 2020-09-06 NOTE — Progress Notes (Signed)
Patient was seen on 09/06/20 for Gestational Diabetes self-management. EDD 12/19/20. Patient states no history of GDM. Diet history obtained. Patient eats variety of all food groups. Beverages include water, some diluted juice.    The following learning objectives were met by the patient :   States the definition of Gestational Diabetes  States why dietary management is important in controlling blood glucose  Describes the effects of carbohydrates on blood glucose levels  Demonstrates ability to create a balanced meal plan  Demonstrates carbohydrate counting   States when to check blood glucose levels  Demonstrates proper blood glucose monitoring techniques  States the effect of stress and exercise on blood glucose levels  States the importance of limiting caffeine and abstaining from alcohol and smoking  Plan:  Aim for 3 Carbohydrate Choices per meal (45 grams) +/- 1 either way  Aim for 1-2 Carbohydrate Choices per snack Begin reading food labels for Total Carbohydrate of foods If OK with your MD, consider  increasing your activity level by walking, Arm Chair Exercises or other activity daily as tolerated Begin checking Blood Glucose before breakfast and 2 hours after first bite of breakfast, lunch and dinner as directed by MD  Bring Log Book/Sheet and meter to every medical appointment  Baby Scripts: Patient was introduced to [employer] and  plans to use as record of blood glucose electronically  Take medication if directed by MD  Blood glucose monitor given: [employer] Lot # [id number] CBG: 72 mg/dL  Patient instructed to monitor glucose levels: FBS: 60 - 95 mg/dl 2 hour: <120 mg/dl  Patient received the following handouts:  Nutrition Diabetes and Pregnancy  Carbohydrate Counting List  Blood glucose Log Sheet  Patient will be seen for follow-up as needed.

## 2020-09-10 ENCOUNTER — Ambulatory Visit (INDEPENDENT_AMBULATORY_CARE_PROVIDER_SITE_OTHER): Payer: Self-pay | Admitting: Obstetrics and Gynecology

## 2020-09-10 ENCOUNTER — Other Ambulatory Visit: Payer: Self-pay

## 2020-09-10 VITALS — BP 135/85 | HR 90 | Wt 173.0 lb

## 2020-09-10 DIAGNOSIS — O0990 Supervision of high risk pregnancy, unspecified, unspecified trimester: Secondary | ICD-10-CM | POA: Insufficient documentation

## 2020-09-10 DIAGNOSIS — O24410 Gestational diabetes mellitus in pregnancy, diet controlled: Secondary | ICD-10-CM

## 2020-09-10 DIAGNOSIS — Z8759 Personal history of other complications of pregnancy, childbirth and the puerperium: Secondary | ICD-10-CM | POA: Insufficient documentation

## 2020-09-10 DIAGNOSIS — Z23 Encounter for immunization: Secondary | ICD-10-CM

## 2020-09-10 DIAGNOSIS — O0992 Supervision of high risk pregnancy, unspecified, second trimester: Secondary | ICD-10-CM

## 2020-09-10 DIAGNOSIS — Z3A25 25 weeks gestation of pregnancy: Secondary | ICD-10-CM | POA: Insufficient documentation

## 2020-09-10 NOTE — Addendum Note (Signed)
Addended by: Alric Seton on: 09/10/2020 09:51 AM   Modules accepted: Orders

## 2020-09-10 NOTE — Addendum Note (Signed)
Addended by: Alric Seton on: 09/10/2020 10:58 AM   Modules accepted: Orders

## 2020-09-10 NOTE — Patient Instructions (Signed)
Gestational Diabetes Mellitus, Self Care When you have gestational diabetes (gestational diabetes mellitus), you must make sure your blood sugar (glucose) stays in a healthy range. You can do this with:  Nutrition.  Exercise.  Lifestyle changes.  Medicines or insulin, if needed.  Support from your doctors and others. If you get treated for this condition, it may not hurt you or your unborn baby (fetus). If you do not get treated for this condition, it may cause problems that can hurt you or your unborn baby. If you get gestational diabetes, you are:  More likely to get it if you get pregnant again.  More likely to develop type 2 diabetes in the future. How to stay aware of blood sugar   Check your blood sugar every day while you are pregnant. Check it as often as told.  Call your doctor if your blood sugar is above your goal numbers for two tests in a row. Your doctor will set personal treatment goals for you. Generally, you should have these blood sugar levels:  Before meals, or after not eating for a long time (fasting or preprandial): at or below 95 mg/dL (5.3 mmol/L).  After meals (postprandial): ? One hour after a meal: at or below 140 mg/dL (7.8 mmol/L). ? Two hours after a meal: at or below 120 mg/dL (6.7 mmol/L).  A1c (hemoglobin A1c) level: 6-6.5%. How to manage high and low blood sugar Signs of high blood sugar High blood sugar is called hyperglycemia. Know the early signs of high blood sugar. Signs may include:  Feeling: ? Thirsty. ? Hungry. ? Very tired.  Needing to pee (urinate) more than usual.  Blurry vision. Signs of low blood sugar Low blood sugar is called hypoglycemia. This is when blood sugar is at or below 70 mg/dL (3.9 mmol/L). Signs may include:  Feeling: ? Hungry. ? Worried or nervous (anxious). ? Sweaty and clammy. ? Confused. ? Dizzy. ? Sleepy. ? Sick to your stomach (nauseous).  Having: ? A fast heartbeat. ? A headache. ? A change  in your vision. ? Tingling or no feeling (numbness) around your mouth, lips, or tongue. ? Jerky movements that you cannot control (seizure).  Having trouble with: ? Moving (coordination). ? Sleeping. ? Passing out (fainting). ? Getting upset easily (irritability). Treating low blood sugar To treat low blood sugar, eat or drink something sugary right away. If you can think clearly and swallow safely, follow the 15:15 rule:  Take 15 grams of a fast-acting carb (carbohydrate). Talk with your doctor about how much you should take.  Some fast-acting carbs are: ? Sugar tablets (glucose pills). Take 3-4 glucose pills. ? 6-8 pieces of hard candy. ? 4-6 oz (120-150 mL) of fruit juice. ? 4-6 oz (120-150 mL) of regular (not diet) soda. ? 1 Tbsp (15 mL) honey or sugar.  Check your blood sugar 15 minutes after you take the carb.  If your blood sugar is still at or below 70 mg/dL (3.9 mmol/L), take 15 grams of a carb again.  If your blood sugar does not go above 70 mg/dL (3.9 mmol/L) after 3 tries, get help right away.  After your blood sugar goes back to normal, eat a meal or a snack within 1 hour. Treating very low blood sugar If your blood sugar is at or below 54 mg/dL (3 mmol/L), you have very low blood sugar (severe hypoglycemia). This is an emergency. Do not wait to see if the symptoms will go away. Get medical help right  away. Call your local emergency services (911 in the [place] If you have very low blood sugar and you cannot eat or drink, you may need a glucagon shot (injection). A family member or friend should learn how to check your blood sugar and how to give you a glucagon shot. Ask your doctor if you need to have a glucagon shot kit at home. Follow these instructions at home: Medicine  Take your insulin and diabetes medicines as told.  If your doctor says you should take more or less insulin or medicines, do this exactly as told.  Do not run out of insulin or  medicines. Food   Make healthy food choices. These include: ? Chicken, fish, egg whites, and beans. ? Oats, whole wheat, bulgur, brown rice, quinoa, and millet. ? Fresh fruits and vegetables. ? Low-fat dairy products. ? Nuts, avocado, olive oil, and canola oil.  Meet with a food specialist (dietitian). He or she can help you make an eating plan that is right for you.  Follow instructions from your doctor about what you cannot eat or drink.  Drink enough fluid to keep your pee (urine) pale yellow.  Eat healthy snacks between healthy meals.  Keep track of carbs that you eat. Do this by reading food labels and learning food serving sizes.  Follow your sick day plan when you cannot eat or drink normally. Make this plan with your doctor so it is ready to use. Activity  Exercise for 30 or more minutes a day, or as much as your doctor recommends.  Talk with your doctor before you start a new exercise or activity. Your doctor may need to tell you to change: ? How much insulin or medicines you take. ? How much food you eat. Lifestyle  Do not drink alcohol.  Do not use any tobacco products. These include cigarettes, chewing tobacco, and e-cigarettes. If you need help quitting, ask your doctor.  Learn how to deal with stress. If you need help with this, ask your doctor. Body care  Stay up to date with your shots (immunizations).  Brush your teeth and gums two times a day. Floss one or more times a day.  Go to the dentist one or more times every 6 months.  Stay at a healthy weight while you are pregnant. General instructions  Take over-the-counter and prescription medicines only as told by your doctor.  Ask your doctor about risks of high blood pressure in pregnancy (preeclampsia and eclampsia).  Share your diabetes care plan with: ? Your work or school. ? People you live with.  Check your pee for ketones: ? When you are sick. ? As told by your doctor.  Carry a card or  wear jewelry that says you have diabetes.  Keep all follow-up visits as told by your doctor. This is important. Care after giving birth  Have your blood sugar checked 4-12 weeks after you give birth.  Get checked for diabetes one or more times during 3 years. Questions to ask your doctor  Do I need to meet with a diabetes educator?  Where can I find a support group for people with gestational diabetes? Where to find more information To learn more about diabetes, visit:  American Diabetes Association: www.diabetes.org  Centers for Disease Control and Prevention (CDC): www.cdc.gov Summary  Check your blood sugar (glucose) every day while you are pregnant. Check it as often as told.  Take your insulin and diabetes medicines as told.  Keep all follow-up visits as   told by your doctor. This is important.  Have your blood sugar checked 4-12 weeks after you give birth. This information is not intended to replace advice given to you by your health care provider. Make sure you discuss any questions you have with your health care provider. Document Revised: 05/31/2018 Document Reviewed: 01/11/2016 Elsevier Patient Education  2020 [employer] Second Trimester of Pregnancy  The second trimester is from week 14 through week 27 (month 4 through 6). This is often the time in pregnancy that you feel your best. Often times, morning sickness has lessened or quit. You may have more energy, and you may get hungry more often. Your unborn baby is growing rapidly. At the end of the sixth month, he or she is about 9 inches long and weighs about 1 pounds. You will likely feel the baby move between 18 and 20 weeks of pregnancy. Follow these instructions at home: Medicines  Take over-the-counter and prescription medicines only as told by your doctor. Some medicines are safe and some medicines are not safe during pregnancy.  Take a prenatal vitamin that contains at least 600 micrograms (mcg) of folic  acid.  If you have trouble pooping (constipation), take medicine that will make your stool soft (stool softener) if your doctor approves. Eating and drinking   Eat regular, healthy meals.  Avoid raw meat and uncooked cheese.  If you get low calcium from the food you eat, talk to your doctor about taking a daily calcium supplement.  Avoid foods that are high in fat and sugars, such as fried and sweet foods.  If you feel sick to your stomach (nauseous) or throw up (vomit): ? Eat 4 or 5 small meals a day instead of 3 large meals. ? Try eating a few soda crackers. ? Drink liquids between meals instead of during meals.  To prevent constipation: ? Eat foods that are high in fiber, like fresh fruits and vegetables, whole grains, and beans. ? Drink enough fluids to keep your pee (urine) clear or pale yellow. Activity  Exercise only as told by your doctor. Stop exercising if you start to have cramps.  Do not exercise if it is too hot, too humid, or if you are in a place of great height (high altitude).  Avoid heavy lifting.  Wear low-heeled shoes. Sit and stand up straight.  You can continue to have sex unless your doctor tells you not to. Relieving pain and discomfort  Wear a good support bra if your breasts are tender.  Take warm water baths (sitz baths) to soothe pain or discomfort caused by hemorrhoids. Use hemorrhoid cream if your doctor approves.  Rest with your legs raised if you have leg cramps or low back pain.  If you develop puffy, bulging veins (varicose veins) in your legs: ? Wear support hose or compression stockings as told by your doctor. ? Raise (elevate) your feet for 15 minutes, 3-4 times a day. ? Limit salt in your food. Prenatal care  Write down your questions. Take them to your prenatal visits.  Keep all your prenatal visits as told by your doctor. This is important. Safety  Wear your seat belt when driving.  Make a list of emergency phone numbers,  including numbers for family, friends, the hospital, and police and fire departments. General instructions  Ask your doctor about the right foods to eat or for help finding a counselor, if you need these services.  Ask your doctor about local prenatal classes. Begin classes before   month 6 of your pregnancy.  Do not use hot tubs, steam rooms, or saunas.  Do not douche or use tampons or scented sanitary pads.  Do not cross your legs for long periods of time.  Visit your dentist if you have not done so. Use a soft toothbrush to brush your teeth. Floss gently.  Avoid all smoking, herbs, and alcohol. Avoid drugs that are not approved by your doctor.  Do not use any products that contain nicotine or tobacco, such as cigarettes and e-cigarettes. If you need help quitting, ask your doctor.  Avoid cat litter boxes and soil used by cats. These carry germs that can cause birth defects in the baby and can cause a loss of your baby (miscarriage) or stillbirth. Contact a doctor if:  You have mild cramps or pressure in your lower belly.  You have pain when you pee (urinate).  You have bad smelling fluid coming from your vagina.  You continue to feel sick to your stomach (nauseous), throw up (vomit), or have watery poop (diarrhea).  You have a nagging pain in your belly area.  You feel dizzy. Get help right away if:  You have a fever.  You are leaking fluid from your vagina.  You have spotting or bleeding from your vagina.  You have severe belly cramping or pain.  You lose or gain weight rapidly.  You have trouble catching your breath and have chest pain.  You notice sudden or extreme puffiness (swelling) of your face, hands, ankles, feet, or legs.  You have not felt the baby move in over an hour.  You have severe headaches that do not go away when you take medicine.  You have trouble seeing. Summary  The second trimester is from week 14 through week 27 (months 4 through 6).  This is often the time in pregnancy that you feel your best.  To take care of yourself and your unborn baby, you will need to eat healthy meals, take medicines only if your doctor tells you to do so, and do activities that are safe for you and your baby.  Call your doctor if you get sick or if you notice anything unusual about your pregnancy. Also, call your doctor if you need help with the right food to eat, or if you want to know what activities are safe for you. This information is not intended to replace advice given to you by your health care provider. Make sure you discuss any questions you have with your health care provider. Document Revised: 04/01/2019 Document Reviewed: 01/13/2017 Elsevier Patient Education  [place].

## 2020-09-10 NOTE — Progress Notes (Signed)
   PRENATAL VISIT NOTE  Subjective:  Erica Hall is a 24 y.o. G2P0010 at [redacted]w[redacted]d being seen today for ongoing prenatal care.  She is currently monitored for the following issues for this high-risk pregnancy and has Molar pregnancy; Encounter for supervision of high risk pregnancy in second trimester, antepartum; Bilateral swelling of feet and ankles; Diet controlled gestational diabetes mellitus (GDM) in second trimester; Supervision of high risk pregnancy, antepartum; [redacted] weeks gestation of pregnancy; and History of molar pregnancy on their problem list.  Patient doing well with no acute concerns today. She reports no complaints.  Contractions: Not present. Vag. Bleeding: None.  Movement: Present. Denies leaking of fluid.   Blood sugars reviewed.  Excellent blood sugar control by diet.  Occasional low blood sugar.  Pt advised to eat more snacks.  The following portions of the patient's history were reviewed and updated as appropriate: allergies, current medications, past family history, past medical history, past social history, past surgical history and problem list. Problem list updated.  Objective:   Vitals:   09/10/20 0906  BP: 135/85  Pulse: 90  Weight: 173 lb (78.5 kg)    Fetal Status: Fetal Heart Rate (bpm): 136   Movement: Present     General:  Alert, oriented and cooperative. Patient is in no acute distress.  Skin: Skin is warm and dry. No rash noted.   Cardiovascular: Normal heart rate noted  Respiratory: Normal respiratory effort, no problems with respiration noted  Abdomen: Soft, gravid, appropriate for gestational age.  Pain/Pressure: Present     Pelvic: Cervical exam deferred        Extremities: Normal range of motion.  Edema: None  Mental Status:  Normal mood and affect. Normal behavior. Normal judgment and thought content.   Assessment and Plan:  Pregnancy: G2P0010 at [redacted]w[redacted]d  1. Supervision of high risk pregnancy, antepartum Tdap today  2. Encounter for  supervision of high risk pregnancy in second trimester, antepartum   3. [redacted] weeks gestation of pregnancy   4. History of molar pregnancy Check path and or quants after delivery  5. Diet controlled gestational diabetes mellitus (GDM) in second trimester Growth scan scheduled - [place] [facility] FOLLOW UP; Future  Preterm labor symptoms and general obstetric precautions including but not limited to vaginal bleeding, contractions, leaking of fluid and fetal movement were reviewed in detail with the patient.  Please refer to After Visit Summary for other counseling recommendations.   Return in about 3 weeks (around 10/01/2020) for [facility], in person.   Lynnda Shields, MD

## 2020-09-24 ENCOUNTER — Other Ambulatory Visit: Payer: Self-pay

## 2020-09-24 ENCOUNTER — Ambulatory Visit: Payer: Self-pay | Attending: Obstetrics and Gynecology

## 2020-09-24 ENCOUNTER — Other Ambulatory Visit: Payer: Self-pay | Admitting: *Deleted

## 2020-09-24 ENCOUNTER — Encounter: Payer: Self-pay | Admitting: *Deleted

## 2020-09-24 ENCOUNTER — Ambulatory Visit: Payer: Self-pay | Admitting: *Deleted

## 2020-09-24 DIAGNOSIS — O0990 Supervision of high risk pregnancy, unspecified, unspecified trimester: Secondary | ICD-10-CM | POA: Insufficient documentation

## 2020-09-24 DIAGNOSIS — O0992 Supervision of high risk pregnancy, unspecified, second trimester: Secondary | ICD-10-CM | POA: Insufficient documentation

## 2020-09-24 DIAGNOSIS — O24410 Gestational diabetes mellitus in pregnancy, diet controlled: Secondary | ICD-10-CM | POA: Insufficient documentation

## 2020-09-24 DIAGNOSIS — Z3A25 25 weeks gestation of pregnancy: Secondary | ICD-10-CM | POA: Insufficient documentation

## 2020-09-28 ENCOUNTER — Other Ambulatory Visit: Payer: Self-pay | Admitting: General Practice

## 2020-09-28 DIAGNOSIS — O0990 Supervision of high risk pregnancy, unspecified, unspecified trimester: Secondary | ICD-10-CM

## 2020-10-01 ENCOUNTER — Other Ambulatory Visit: Payer: Self-pay

## 2020-10-01 ENCOUNTER — Ambulatory Visit (INDEPENDENT_AMBULATORY_CARE_PROVIDER_SITE_OTHER): Payer: Self-pay | Admitting: Obstetrics and Gynecology

## 2020-10-01 VITALS — BP 111/74 | HR 79 | Wt 178.8 lb

## 2020-10-01 DIAGNOSIS — O0990 Supervision of high risk pregnancy, unspecified, unspecified trimester: Secondary | ICD-10-CM

## 2020-10-01 DIAGNOSIS — O24410 Gestational diabetes mellitus in pregnancy, diet controlled: Secondary | ICD-10-CM

## 2020-10-01 DIAGNOSIS — Z8759 Personal history of other complications of pregnancy, childbirth and the puerperium: Secondary | ICD-10-CM

## 2020-10-01 DIAGNOSIS — O0992 Supervision of high risk pregnancy, unspecified, second trimester: Secondary | ICD-10-CM

## 2020-10-01 DIAGNOSIS — Z3A28 28 weeks gestation of pregnancy: Secondary | ICD-10-CM

## 2020-10-01 NOTE — Progress Notes (Signed)
Prenatal Visit Note Date: 10/01/2020 Clinic: [facility]  Subjective:  Erica Hall is a 24 y.o. G2P0010 at [redacted]w[redacted]d being seen today for ongoing prenatal care.  She is currently monitored for the following issues for this high-risk pregnancy and has Encounter for supervision of high risk pregnancy in second trimester, antepartum; Bilateral swelling of feet and ankles; Diet controlled gestational diabetes mellitus (GDM) in second trimester; Supervision of high risk pregnancy, antepartum; and History of molar pregnancy on their problem list.  Patient reports round ligament discomfort.   Contractions: Not present. Vag. Bleeding: None.  Movement: Present. Denies leaking of fluid.   The following portions of the patient's history were reviewed and updated as appropriate: allergies, current medications, past family history, past medical history, past social history, past surgical history and problem list. Problem list updated.  Objective:   Vitals:   10/01/20 0827  BP: 111/74  Pulse: 79  Weight: 178 lb 12.8 oz (81.1 kg)    Fetal Status: Fetal Heart Rate (bpm): 154   Movement: Present     General:  Alert, oriented and cooperative. Patient is in no acute distress.  Skin: Skin is warm and dry. No rash noted.   Cardiovascular: Normal heart rate noted  Respiratory: Normal respiratory effort, no problems with respiration noted  Abdomen: Soft, gravid, appropriate for gestational age. Pain/Pressure: Absent     Pelvic:  Cervical exam deferred        Extremities: Normal range of motion.  Edema: None  Mental Status: Normal mood and affect. Normal behavior. Normal judgment and thought content.   Urinalysis:      Assessment and Plan:  Pregnancy: G2P0010 at [redacted]w[redacted]d  1. Diet controlled gestational diabetes mellitus (GDM) in second trimester Normal CBG log. Told her to watch dinner. Normal growth last week  2. Encounter for supervision of high risk pregnancy in second  trimester, antepartum Routine care. 28wk labs today  3. Supervision of high risk pregnancy, antepartum  4. History of molar pregnancy Placenta to path  5. [redacted] weeks gestation of pregnancy  Preterm labor symptoms and general obstetric precautions including but not limited to vaginal bleeding, contractions, leaking of fluid and fetal movement were reviewed in detail with the patient. Please refer to After Visit Summary for other counseling recommendations.  Return in about 2 weeks (around 10/15/2020) for high risk, in person.   Aletha Halim, MD

## 2020-10-02 LAB — CBC
Hematocrit: 37.7 % (ref 34.0–46.6)
Hemoglobin: 12.8 g/dL (ref 11.1–15.9)
MCH: 31.7 pg (ref 26.6–33.0)
MCHC: 34 g/dL (ref 31.5–35.7)
MCV: 93 fL (ref 79–97)
Platelets: 274 10*3/uL (ref 150–450)
RBC: 4.04 x10E6/uL (ref 3.77–5.28)
RDW: 12.2 % (ref 11.7–15.4)
WBC: 8.1 10*3/uL (ref 3.4–10.8)

## 2020-10-02 LAB — RPR: RPR Ser Ql: NONREACTIVE

## 2020-10-02 LAB — HIV ANTIBODY (ROUTINE TESTING W REFLEX): HIV Screen 4th Generation wRfx: NONREACTIVE

## 2020-10-15 ENCOUNTER — Ambulatory Visit (INDEPENDENT_AMBULATORY_CARE_PROVIDER_SITE_OTHER): Payer: Self-pay | Admitting: Obstetrics and Gynecology

## 2020-10-15 ENCOUNTER — Other Ambulatory Visit: Payer: Self-pay

## 2020-10-15 VITALS — BP 122/73 | HR 74 | Wt 174.1 lb

## 2020-10-15 DIAGNOSIS — Z3A30 30 weeks gestation of pregnancy: Secondary | ICD-10-CM

## 2020-10-15 DIAGNOSIS — O24410 Gestational diabetes mellitus in pregnancy, diet controlled: Secondary | ICD-10-CM

## 2020-10-15 DIAGNOSIS — O0990 Supervision of high risk pregnancy, unspecified, unspecified trimester: Secondary | ICD-10-CM

## 2020-10-15 DIAGNOSIS — Z8759 Personal history of other complications of pregnancy, childbirth and the puerperium: Secondary | ICD-10-CM

## 2020-10-15 NOTE — Patient Instructions (Addendum)
Gestational Diabetes Mellitus, Self Care When you have gestational diabetes (gestational diabetes mellitus), you must make sure your blood sugar (glucose) stays in a healthy range. You can do this with:  Nutrition.  Exercise.  Lifestyle changes.  Medicines or insulin, if needed.  Support from your doctors and others. If you get treated for this condition, it may not hurt you or your unborn baby (fetus). If you do not get treated for this condition, it may cause problems that can hurt you or your unborn baby. If you get gestational diabetes, you are:  More likely to get it if you get pregnant again.  More likely to develop type 2 diabetes in the future. How to stay aware of blood sugar   Check your blood sugar every day while you are pregnant. Check it as often as told.  Call your doctor if your blood sugar is above your goal numbers for two tests in a row. Your doctor will set personal treatment goals for you. Generally, you should have these blood sugar levels:  Before meals, or after not eating for a long time (fasting or preprandial): at or below 95 mg/dL (5.3 mmol/L).  After meals (postprandial): ? One hour after a meal: at or below 140 mg/dL (7.8 mmol/L). ? Two hours after a meal: at or below 120 mg/dL (6.7 mmol/L).  A1c (hemoglobin A1c) level: 6-6.5%. How to manage high and low blood sugar Signs of high blood sugar High blood sugar is called hyperglycemia. Know the early signs of high blood sugar. Signs may include:  Feeling: ? Thirsty. ? Hungry. ? Very tired.  Needing to pee (urinate) more than usual.  Blurry vision. Signs of low blood sugar Low blood sugar is called hypoglycemia. This is when blood sugar is at or below 70 mg/dL (3.9 mmol/L). Signs may include:  Feeling: ? Hungry. ? Worried or nervous (anxious). ? Sweaty and clammy. ? Confused. ? Dizzy. ? Sleepy. ? Sick to your stomach (nauseous).  Having: ? A fast heartbeat. ? A headache. ? A change  in your vision. ? Tingling or no feeling (numbness) around your mouth, lips, or tongue. ? Jerky movements that you cannot control (seizure).  Having trouble with: ? Moving (coordination). ? Sleeping. ? Passing out (fainting). ? Getting upset easily (irritability). Treating low blood sugar To treat low blood sugar, eat or drink something sugary right away. If you can think clearly and swallow safely, follow the 15:15 rule:  Take 15 grams of a fast-acting carb (carbohydrate). Talk with your doctor about how much you should take.  Some fast-acting carbs are: ? Sugar tablets (glucose pills). Take 3-4 glucose pills. ? 6-8 pieces of hard candy. ? 4-6 oz (120-150 mL) of fruit juice. ? 4-6 oz (120-150 mL) of regular (not diet) soda. ? 1 Tbsp (15 mL) honey or sugar.  Check your blood sugar 15 minutes after you take the carb.  If your blood sugar is still at or below 70 mg/dL (3.9 mmol/L), take 15 grams of a carb again.  If your blood sugar does not go above 70 mg/dL (3.9 mmol/L) after 3 tries, get help right away.  After your blood sugar goes back to normal, eat a meal or a snack within 1 hour. Treating very low blood sugar If your blood sugar is at or below 54 mg/dL (3 mmol/L), you have very low blood sugar (severe hypoglycemia). This is an emergency. Do not wait to see if the symptoms will go away. Get medical help right   away. Call your local emergency services (911 in the [place] If you have very low blood sugar and you cannot eat or drink, you may need a glucagon shot (injection). A family member or friend should learn how to check your blood sugar and how to give you a glucagon shot. Ask your doctor if you need to have a glucagon shot kit at home. Follow these instructions at home: Medicine  Take your insulin and diabetes medicines as told.  If your doctor says you should take more or less insulin or medicines, do this exactly as told.  Do not run out of insulin or medicines. Food    Make healthy food choices. These include: ? Chicken, fish, egg whites, and beans. ? Oats, whole wheat, bulgur, brown rice, quinoa, and millet. ? Fresh fruits and vegetables. ? Low-fat dairy products. ? Nuts, avocado, olive oil, and canola oil.  Meet with a food specialist (dietitian). He or she can help you make an eating plan that is right for you.  Follow instructions from your doctor about what you cannot eat or drink.  Drink enough fluid to keep your pee (urine) pale yellow.  Eat healthy snacks between healthy meals.  Keep track of carbs that you eat. Do this by reading food labels and learning food serving sizes.  Follow your sick day plan when you cannot eat or drink normally. Make this plan with your doctor so it is ready to use. Activity  Exercise for 30 or more minutes a day, or as much as your doctor recommends.  Talk with your doctor before you start a new exercise or activity. Your doctor may need to tell you to change: ? How much insulin or medicines you take. ? How much food you eat. Lifestyle  Do not drink alcohol.  Do not use any tobacco products. These include cigarettes, chewing tobacco, and e-cigarettes. If you need help quitting, ask your doctor.  Learn how to deal with stress. If you need help with this, ask your doctor. Body care  Stay up to date with your shots (immunizations).  Brush your teeth and gums two times a day. Floss one or more times a day.  Go to the dentist one or more times every 6 months.  Stay at a healthy weight while you are pregnant. General instructions  Take over-the-counter and prescription medicines only as told by your doctor.  Ask your doctor about risks of high blood pressure in pregnancy (preeclampsia and eclampsia).  Share your diabetes care plan with: ? Your work or school. ? People you live with.  Check your pee for ketones: ? When you are sick. ? As told by your doctor.  Carry a card or wear jewelry that  says you have diabetes.  Keep all follow-up visits as told by your doctor. This is important. Care after giving birth  Have your blood sugar checked 4-12 weeks after you give birth.  Get checked for diabetes one or more times during 3 years. Questions to ask your doctor  Do I need to meet with a diabetes educator?  Where can I find a support group for people with gestational diabetes? Where to find more information To learn more about diabetes, visit:  American Diabetes Association: www.diabetes.org  Centers for Disease Control and Prevention (CDC): www.cdc.gov Summary  Check your blood sugar (glucose) every day while you are pregnant. Check it as often as told.  Take your insulin and diabetes medicines as told.  Keep all follow-up visits as   told by your doctor. This is important.  Have your blood sugar checked 4-12 weeks after you give birth. This information is not intended to replace advice given to you by your health care provider. Make sure you discuss any questions you have with your health care provider. Document Revised: 05/31/2018 Document Reviewed: 01/11/2016 Elsevier Patient Education  2020 Else[employer] Inc.  Round Ligament Pain  The round ligament is a cord of muscle and tissue that helps support the uterus. It can become a source of pain during pregnancy if it becomes stretched or twisted as the baby grows. The pain usually begins in the second trimester (13-28 weeks) of pregnancy, and it can come and go until the baby is delivered. It is not a serious problem, and it does not cause harm to the baby. Round ligament pain is usually a short, sharp, and pinching pain, but it can also be a dull, lingering, and aching pain. The pain is felt in the lower side of the abdomen or in the groin. It usually starts deep in the groin and moves up to the outside of the hip area. The pain may occur when you:  Suddenly change position, such as quickly going from a  sitting to standing position.  Roll over in bed.  Cough or sneeze.  Do physical activity. Follow these instructions at home:   Watch your condition for any changes.  When the pain starts, relax. Then try any of these methods to help with the pain: ? Sitting down. ? Flexing your knees up to your abdomen. ? Lying on your side with one pillow under your abdomen and another pillow between your legs. ? Sitting in a warm bath for 15-20 minutes or until the pain goes away.  Take over-the-counter and prescription medicines only as told by your health care provider.  Move slowly when you sit down or stand up.  Avoid long walks if they cause pain.  Stop or reduce your physical activities if they cause pain.  Keep all follow-up visits as told by your health care provider. This is important. Contact a health care provider if:  Your pain does not go away with treatment.  You feel pain in your back that you did not have before.  Your medicine is not helping. Get help right away if:  You have a fever or chills.  You develop uterine contractions.  You have vaginal bleeding.  You have nausea or vomiting.  You have diarrhea.  You have pain when you urinate. Summary  Round ligament pain is felt in the lower abdomen or groin. It is usually a short, sharp, and pinching pain. It can also be a dull, lingering, and aching pain.  This pain usually begins in the second trimester (13-28 weeks). It occurs because the uterus is stretching with the growing baby, and it is not harmful to the baby.  You may notice the pain when you suddenly change position, when you cough or sneeze, or during physical activity.  Relaxing, flexing your knees to your abdomen, lying on one side, or taking a warm bath may help to get rid of the pain.  Get help from your health care provider if the pain does not go away or if you have vaginal bleeding, nausea, vomiting, diarrhea, or painful urination. This  information is not intended to replace advice given to you by your health care provider. Make sure you discuss any questions you have with your health care provider. Document Revised: 05/26/2018 Document Reviewed: 05/26/2018   Elsevier Patient Education  [employer].

## 2020-10-15 NOTE — Progress Notes (Signed)
   PRENATAL VISIT NOTE  Subjective:  Erica Hall is a 24 y.o. G2P0010 at [redacted]w[redacted]d being seen today for ongoing prenatal care.  She is currently monitored for the following issues for this high-risk pregnancy and has Encounter for supervision of high risk pregnancy in second trimester, antepartum; Bilateral swelling of feet and ankles; Diet controlled gestational diabetes mellitus (GDM) in third trimester; Supervision of high risk pregnancy, antepartum; History of molar pregnancy; and [redacted] weeks gestation of pregnancy on their problem list.  Patient doing well with no acute concerns today. She reports no complaints.  Contractions: Not present. Vag. Bleeding: None.  Movement: Present. Denies leaking of fluid.   The following portions of the patient's history were reviewed and updated as appropriate: allergies, current medications, past family history, past medical history, past social history, past surgical history and problem list. Problem list updated.  Objective:   Vitals:   10/15/20 0952  BP: 122/73  Pulse: 74  Weight: 174 lb 1.6 oz (79 kg)    Fetal Status: Fetal Heart Rate (bpm): 141 Fundal Height: 30 cm Movement: Present     General:  Alert, oriented and cooperative. Patient is in no acute distress.  Skin: Skin is warm and dry. No rash noted.   Cardiovascular: Normal heart rate noted  Respiratory: Normal respiratory effort, no problems with respiration noted  Abdomen: Soft, gravid, appropriate for gestational age.  Pain/Pressure: Absent     Pelvic: Cervical exam deferred        Extremities: Normal range of motion.  Edema: None  Mental Status:  Normal mood and affect. Normal behavior. Normal judgment and thought content.   Assessment and Plan:  Pregnancy: G2P0010 at [redacted]w[redacted]d  1. Diet controlled gestational diabetes mellitus (GDM) in third trimester Good blood sugar control noted on babyscripts, pt not taking dinner postprandials due to lateness of her meals.  Compromise with pt  to take dinner postprandials 2-3 times per week since she is in relatively good control, growth scan on 11/2  2. Supervision of high risk pregnancy, antepartum   3. History of molar pregnancy Check placenta after delivery  4. [redacted] weeks gestation of pregnancy   Preterm labor symptoms and general obstetric precautions including but not limited to vaginal bleeding, contractions, leaking of fluid and fetal movement were reviewed in detail with the patient.  Please refer to After Visit Summary for other counseling recommendations.   Return in about 2 weeks (around 10/29/2020) for [facility], in person.   Lynnda Shields, MD

## 2020-10-15 NOTE — Progress Notes (Signed)
° °  Fasting 77 this am

## 2020-10-22 ENCOUNTER — Encounter: Payer: Self-pay | Admitting: Family Medicine

## 2020-10-23 ENCOUNTER — Ambulatory Visit: Payer: Self-pay | Attending: Obstetrics and Gynecology

## 2020-10-23 ENCOUNTER — Encounter: Payer: Self-pay | Admitting: *Deleted

## 2020-10-23 ENCOUNTER — Other Ambulatory Visit: Payer: Self-pay | Admitting: *Deleted

## 2020-10-23 ENCOUNTER — Other Ambulatory Visit: Payer: Self-pay

## 2020-10-23 ENCOUNTER — Ambulatory Visit: Payer: Self-pay | Admitting: *Deleted

## 2020-10-23 DIAGNOSIS — Z362 Encounter for other antenatal screening follow-up: Secondary | ICD-10-CM

## 2020-10-23 DIAGNOSIS — Z3A31 31 weeks gestation of pregnancy: Secondary | ICD-10-CM

## 2020-10-23 DIAGNOSIS — O24410 Gestational diabetes mellitus in pregnancy, diet controlled: Secondary | ICD-10-CM

## 2020-10-23 DIAGNOSIS — O09293 Supervision of pregnancy with other poor reproductive or obstetric history, third trimester: Secondary | ICD-10-CM

## 2020-10-23 DIAGNOSIS — O0990 Supervision of high risk pregnancy, unspecified, unspecified trimester: Secondary | ICD-10-CM

## 2020-10-29 ENCOUNTER — Ambulatory Visit (INDEPENDENT_AMBULATORY_CARE_PROVIDER_SITE_OTHER): Payer: Self-pay | Admitting: Obstetrics & Gynecology

## 2020-10-29 ENCOUNTER — Other Ambulatory Visit: Payer: Self-pay

## 2020-10-29 VITALS — BP 121/77 | HR 85 | Wt 176.5 lb

## 2020-10-29 DIAGNOSIS — O0990 Supervision of high risk pregnancy, unspecified, unspecified trimester: Secondary | ICD-10-CM

## 2020-10-29 DIAGNOSIS — O24410 Gestational diabetes mellitus in pregnancy, diet controlled: Secondary | ICD-10-CM

## 2020-10-29 NOTE — Progress Notes (Signed)
   PRENATAL VISIT NOTE  Subjective:  Erica Hall is a 24 y.o. G2P0010 at [redacted]w[redacted]d being seen today for ongoing prenatal care.  She is currently monitored for the following issues for this high-risk pregnancy and has Encounter for supervision of high risk pregnancy in second trimester, antepartum; Bilateral swelling of feet and ankles; Diet controlled gestational diabetes mellitus (GDM) in third trimester; Supervision of high risk pregnancy, antepartum; History of molar pregnancy; and [redacted] weeks gestation of pregnancy on their problem list.  Patient reports backache and shortness of breath with exertion.  Contractions: Not present. Vag. Bleeding: None.  Movement: Present. Denies leaking of fluid.   Blood sugars reviewed and looking good. Several low readings in the 50s after the patient eats breakfast. She feels shaky and fatigued during this time. Reports that she drinks soda or juice and feels much better.  The following portions of the patient's history were reviewed and updated as appropriate: allergies, current medications, past family history, past medical history, past social history, past surgical history and problem list.   Objective:   Vitals:   10/29/20 0956  BP: 121/77  Pulse: 85  Weight: 176 lb 8 oz (80.1 kg)    Fetal Status: Fetal Heart Rate (bpm): 145   Movement: Present     General:  Alert, oriented and cooperative. Patient is in no acute distress.  Skin: Skin is warm and dry. No rash noted.   Cardiovascular: Normal heart rate noted  Respiratory: Normal respiratory effort, no problems with respiration noted  Abdomen: Soft, gravid, appropriate for gestational age. Fundal Height: 32 cm.  Pain/Pressure: Absent     Pelvic: Cervical exam deferred        Extremities: Normal range of motion.  Edema: None  Mental Status: Normal mood and affect. Normal behavior. Normal judgment and thought content.   Assessment and Plan:  Pregnancy: G2P0010 at [redacted]w[redacted]d 1. Supervision of high  risk pregnancy, antepartum Recent U/S normal on 11/2, EFW 29 %.  -Repeat U/S scheduled in 4 weeks  2. Diet controlled gestational diabetes mellitus (GDM) in third trimester Well controlled, has occasional low values after breakfast in the 50s. Usually will drink some juice or soda and feels better shortly after. -Counseled her that it is safe to receive a pedicure and have her feet soaked. -Continue routine glucose monitoring.  3. [redacted] weeks gestation of pregnancy  Preterm labor symptoms and general obstetric precautions including but not limited to vaginal bleeding, contractions, leaking of fluid and fetal movement were reviewed in detail with the patient. Please refer to After Visit Summary for other counseling recommendations.   No follow-ups on file.  Future Appointments  Date Time Provider [place]  11/12/2020  8:55 AM Woodroe Mode, MD [facility]  11/21/2020  7:15 AM [facility] NURSE [facility]  11/21/2020  7:30 AM [facility] US3 [facility] [place]    Fulton Reek, Medical Student

## 2020-10-29 NOTE — Progress Notes (Signed)
       Apolonio Schneiders RN 10/29/20

## 2020-10-29 NOTE — Patient Instructions (Signed)
Back Pain in Pregnancy Back pain during pregnancy is common. Back pain may be caused by several factors that are related to changes during your pregnancy. Follow these instructions at home: Managing pain, stiffness, and swelling      If directed, for sudden (acute) back pain, put ice on the painful area. ? Put ice in a plastic bag. ? Place a towel between your skin and the bag. ? Leave the ice on for 20 minutes, 2-3 times per day.  If directed, apply heat to the affected area before you exercise. Use the heat source that your health care provider recommends, such as a moist heat pack or a heating pad. ? Place a towel between your skin and the heat source. ? Leave the heat on for 20-30 minutes. ? Remove the heat if your skin turns bright red. This is especially important if you are unable to feel pain, heat, or cold. You may have a greater risk of getting burned.  If directed, massage the affected area. Activity  Exercise as told by your health care provider. Gentle exercise is the best way to prevent or manage back pain.  Listen to your body when lifting. If lifting hurts, ask for help or bend your knees. This uses your leg muscles instead of your back muscles.  Squat down when picking up something from the floor. Do not bend over.  Only use bed rest for short periods as told by your health care provider. Bed rest should only be used for the most severe episodes of back pain. Standing, sitting, and lying down  Do not stand in one place for long periods of time.  Use good posture when sitting. Make sure your head rests over your shoulders and is not hanging forward. Use a pillow on your lower back if necessary.  Try sleeping on your side, preferably the left side, with a pregnancy support pillow or 1-2 regular pillows between your legs. ? If you have back pain after a night's rest, your bed may be too soft. ? A firm mattress may provide more support for your back during  pregnancy. General instructions  Do not wear high heels.  Eat a healthy diet. Try to gain weight within your health care provider's recommendations.  Use a maternity girdle, elastic sling, or back brace as told by your health care provider.  Take over-the-counter and prescription medicines only as told by your health care provider.  Work with a physical therapist or massage therapist to find ways to manage back pain. Acupuncture or massage therapy may be helpful.  Keep all follow-up visits as told by your health care provider. This is important. Contact a health care provider if:  Your back pain interferes with your daily activities.  You have increasing pain in other parts of your body. Get help right away if:  You develop numbness, tingling, weakness, or problems with the use of your arms or legs.  You develop severe back pain that is not controlled with medicine.  You have a change in bowel or bladder control.  You develop shortness of breath, dizziness, or you faint.  You develop nausea, vomiting, or sweating.  You have back pain that is a rhythmic, cramping pain similar to labor pains. Labor pain is usually 1-2 minutes apart, lasts for about 1 minute, and involves a bearing down feeling or pressure in your pelvis.  You have back pain and your water breaks or you have vaginal bleeding.  You have back pain or numbness  that travels down your leg.  Your back pain developed after you fell.  You develop pain on one side of your back.  You see blood in your urine.  You develop skin blisters in the area of your back pain. Summary  Back pain may be caused by several factors that are related to changes during your pregnancy.  Follow instructions as told by your health care provider for managing pain, stiffness, and swelling.  Exercise as told by your health care provider. Gentle exercise is the best way to prevent or manage back pain.  Take over-the-counter and  prescription medicines only as told by your health care provider.  Keep all follow-up visits as told by your health care provider. This is important. This information is not intended to replace advice given to you by your health care provider. Make sure you discuss any questions you have with your health care provider. Document Revised: 03/29/2019 Document Reviewed: 05/26/2018 Elsevier Patient Education  [place].

## 2020-11-05 ENCOUNTER — Encounter: Payer: Self-pay | Admitting: Obstetrics and Gynecology

## 2020-11-12 ENCOUNTER — Ambulatory Visit (INDEPENDENT_AMBULATORY_CARE_PROVIDER_SITE_OTHER): Payer: Self-pay | Admitting: Obstetrics & Gynecology

## 2020-11-12 ENCOUNTER — Other Ambulatory Visit: Payer: Self-pay

## 2020-11-12 VITALS — BP 129/88 | HR 87 | Wt 180.2 lb

## 2020-11-12 DIAGNOSIS — O24410 Gestational diabetes mellitus in pregnancy, diet controlled: Secondary | ICD-10-CM

## 2020-11-12 DIAGNOSIS — O0990 Supervision of high risk pregnancy, unspecified, unspecified trimester: Secondary | ICD-10-CM

## 2020-11-12 LAB — POCT URINALYSIS DIP (DEVICE)
Bilirubin Urine: NEGATIVE
Glucose, UA: NEGATIVE mg/dL
Hgb urine dipstick: NEGATIVE
Ketones, ur: NEGATIVE mg/dL
Leukocytes,Ua: NEGATIVE
Nitrite: NEGATIVE
Protein, ur: NEGATIVE mg/dL
Specific Gravity, Urine: 1.02 (ref 1.005–1.030)
Urobilinogen, UA: 0.2 mg/dL (ref 0.0–1.0)
pH: 6.5 (ref 5.0–8.0)

## 2020-11-12 NOTE — Progress Notes (Signed)
   PRENATAL VISIT NOTE  Subjective:  Erica Hall is a 24 y.o. G2P0010 at [redacted]w[redacted]d being seen today for ongoing prenatal care.  She is currently monitored for the following issues for this high-risk pregnancy and has Encounter for supervision of high risk pregnancy in second trimester, antepartum; Bilateral swelling of feet and ankles; Diet controlled gestational diabetes mellitus (GDM) in third trimester; Supervision of high risk pregnancy, antepartum; History of molar pregnancy; and [redacted] weeks gestation of pregnancy on their problem list.  Patient reports occasional contractions.  Contractions: Not present. Vag. Bleeding: None.  Movement: Present. Denies leaking of fluid.   The following portions of the patient's history were reviewed and updated as appropriate: allergies, current medications, past family history, past medical history, past social history, past surgical history and problem list.   Objective:   Vitals:   11/12/20 0916  BP: 129/88  Pulse: 87  Weight: 180 lb 3.2 oz (81.7 kg)    Fetal Status: Fetal Heart Rate (bpm): 146 Fundal Height: 35 cm Movement: Present     General:  Alert, oriented and cooperative. Patient is in no acute distress.  Skin: Skin is warm and dry. No rash noted.   Cardiovascular: Normal heart rate noted  Respiratory: Normal respiratory effort, no problems with respiration noted  Abdomen: Soft, gravid, appropriate for gestational age.  Pain/Pressure: Absent     Pelvic: Cervical exam deferred        Extremities: Normal range of motion.  Edema: Trace  Mental Status: Normal mood and affect. Normal behavior. Normal judgment and thought content.   Assessment and Plan:  Pregnancy: G2P0010 at [redacted]w[redacted]d 1. Supervision of high risk pregnancy, antepartum F/u US in 1 week  2. Diet controlled gestational diabetes mellitus (GDM) in third trimester Doing well on diet control  Preterm labor symptoms and general obstetric precautions including but not limited to  vaginal bleeding, contractions, leaking of fluid and fetal movement were reviewed in detail with the patient. Please refer to After Visit Summary for other counseling recommendations.   Return in about 2 weeks (around 11/26/2020) for wants her future appt scheduled .  Future Appointments  Date Time Provider [place]  11/21/2020  7:15 AM [facility] NURSE [facility]  11/21/2020  7:30 AM [facility] US3 [facility]  11/26/2020 10:35 AM Cherre Blanc, MD [facility]  12/03/2020  8:15 AM Chancy Milroy, MD [facility]  12/10/2020  8:15 AM Cherre Blanc, MD [facility]  12/17/2020  8:15 AM Woodroe Mode, MD [facility]    Emeterio Reeve, MD

## 2020-11-12 NOTE — Patient Instructions (Signed)
Contraception Choices Contraception, also called birth control, refers to methods or devices that prevent pregnancy. Hormonal methods Contraceptive implant  A contraceptive implant is a thin, plastic tube that contains a hormone. It is inserted into the upper part of the arm. It can remain in place for up to 3 years. Progestin-only injections Progestin-only injections are injections of progestin, a synthetic form of the hormone progesterone. They are given every 3 months by a health care provider. Birth control pills  Birth control pills are pills that contain hormones that prevent pregnancy. They must be taken once a day, preferably at the same time each day. Birth control patch  The birth control patch contains hormones that prevent pregnancy. It is placed on the skin and must be changed once a week for three weeks and removed on the fourth week. A prescription is needed to use this method of contraception. Vaginal ring  A vaginal ring contains hormones that prevent pregnancy. It is placed in the vagina for three weeks and removed on the fourth week. After that, the process is repeated with a new ring. A prescription is needed to use this method of contraception. Emergency contraceptive Emergency contraceptives prevent pregnancy after unprotected sex. They come in pill form and can be taken up to 5 days after sex. They work best the sooner they are taken after having sex. Most emergency contraceptives are available without a prescription. This method should not be used as your only form of birth control. Barrier methods Female condom  A female condom is a thin sheath that is worn over the penis during sex. Condoms keep sperm from going inside a woman's body. They can be used with a spermicide to increase their effectiveness. They should be disposed after a single use. Female condom  A female condom is a soft, loose-fitting sheath that is put into the vagina before sex. The condom keeps sperm  from going inside a woman's body. They should be disposed after a single use. Diaphragm  A diaphragm is a soft, dome-shaped barrier. It is inserted into the vagina before sex, along with a spermicide. The diaphragm blocks sperm from entering the uterus, and the spermicide kills sperm. A diaphragm should be left in the vagina for 6-8 hours after sex and removed within 24 hours. A diaphragm is prescribed and fitted by a health care provider. A diaphragm should be replaced every 1-2 years, after giving birth, after gaining more than 15 lb (6.8 kg), and after pelvic surgery. Cervical cap  A cervical cap is a round, soft latex or plastic cup that fits over the cervix. It is inserted into the vagina before sex, along with spermicide. It blocks sperm from entering the uterus. The cap should be left in place for 6-8 hours after sex and removed within 48 hours. A cervical cap must be prescribed and fitted by a health care provider. It should be replaced every 2 years. Sponge  A sponge is a soft, circular piece of polyurethane foam with spermicide on it. The sponge helps block sperm from entering the uterus, and the spermicide kills sperm. To use it, you make it wet and then insert it into the vagina. It should be inserted before sex, left in for at least 6 hours after sex, and removed and thrown away within 30 hours. Spermicides Spermicides are chemicals that kill or block sperm from entering the cervix and uterus. They can come as a cream, jelly, suppository, foam, or tablet. A spermicide should be inserted into the   vagina with an applicator at least 82-42 minutes before sex to allow time for it to work. The process must be repeated every time you have sex. Spermicides do not require a prescription. Intrauterine contraception Intrauterine device (IUD) An IUD is a T-shaped device that is put in a woman's uterus. There are two types:  Hormone IUD.This type contains progestin, a synthetic form of the hormone  progesterone. This type can stay in place for 3-5 years.  Copper IUD.This type is wrapped in copper wire. It can stay in place for 10 years.  Permanent methods of contraception Female tubal ligation In this method, a woman's fallopian tubes are sealed, tied, or blocked during surgery to prevent eggs from traveling to the uterus. Hysteroscopic sterilization In this method, a small, flexible insert is placed into each fallopian tube. The inserts cause scar tissue to form in the fallopian tubes and block them, so sperm cannot reach an egg. The procedure takes about 3 months to be effective. Another form of birth control must be used during those 3 months. Female sterilization This is a procedure to tie off the tubes that carry sperm (vasectomy). After the procedure, the man can still ejaculate fluid (semen). Natural planning methods Natural family planning In this method, a couple does not have sex on days when the woman could become pregnant. Calendar method This means keeping track of the length of each menstrual cycle, identifying the days when pregnancy can happen, and not having sex on those days. Ovulation method In this method, a couple avoids sex during ovulation. Symptothermal method This method involves not having sex during ovulation. The woman typically checks for ovulation by watching changes in her temperature and in the consistency of cervical mucus. Post-ovulation method In this method, a couple waits to have sex until after ovulation. Summary  Contraception, also called birth control, means methods or devices that prevent pregnancy.  Hormonal methods of contraception include implants, injections, pills, patches, vaginal rings, and emergency contraceptives.  Barrier methods of contraception can include female condoms, female condoms, diaphragms, cervical caps, sponges, and spermicides.  There are two types of IUDs (intrauterine devices). An IUD can be put in a woman's uterus to  prevent pregnancy for 3-5 years.  Permanent sterilization can be done through a procedure for males, females, or both.  Natural family planning methods involve not having sex on days when the woman could become pregnant. This information is not intended to replace advice given to you by your health care provider. Make sure you discuss any questions you have with your health care provider. Document Revised: 12/10/2017 Document Reviewed: 01/10/2017 Elsevier Patient Education  2020 [place]. Vaginal Delivery  Vaginal delivery means that you give birth by pushing your baby out of your birth canal (vagina). A team of health care providers will help you before, during, and after vaginal delivery. Birth experiences are unique for every woman and every pregnancy, and birth experiences vary depending on where you choose to give birth. What happens when I arrive at the birth center or hospital? Once you are in labor and have been admitted into the hospital or birth center, your health care provider may:  Review your pregnancy history and any concerns that you have.  Insert an IV into one of your veins. This may be used to give you fluids and medicines.  Check your blood pressure, pulse, temperature, and heart rate (vital signs).  Check whether your bag of water (amniotic sac) has broken (ruptured).  Talk with you  about your birth plan and discuss pain control options. Monitoring Your health care provider may monitor your contractions (uterine monitoring) and your baby's heart rate (fetal monitoring). You may need to be monitored:  Often, but not continuously (intermittently).  All the time or for long periods at a time (continuously). Continuous monitoring may be needed if: ? You are taking certain medicines, such as medicine to relieve pain or make your contractions stronger. ? You have pregnancy or labor complications. Monitoring may be done by:  Placing a special stethoscope or a  handheld monitoring device on your abdomen to check your baby's heartbeat and to check for contractions.  Placing monitors on your abdomen (external monitors) to record your baby's heartbeat and the frequency and length of contractions.  Placing monitors inside your uterus through your vagina (internal monitors) to record your baby's heartbeat and the frequency, length, and strength of your contractions. Depending on the type of monitor, it may remain in your uterus or on your baby's head until birth.  Telemetry. This is a type of continuous monitoring that can be done with external or internal monitors. Instead of having to stay in bed, you are able to move around during telemetry. Physical exam Your health care provider may perform frequent physical exams. This may include:  Checking how and where your baby is positioned in your uterus.  Checking your cervix to determine: ? Whether it is thinning out (effacing). ? Whether it is opening up (dilating). What happens during labor and delivery?  Normal labor and delivery is divided into the following three stages: Stage 1  This is the longest stage of labor.  This stage can last for hours or days.  Throughout this stage, you will feel contractions. Contractions generally feel mild, infrequent, and irregular at first. They get stronger, more frequent (about every 2-3 minutes), and more regular as you move through this stage.  This stage ends when your cervix is completely dilated to 4 inches (10 cm) and completely effaced. Stage 2  This stage starts once your cervix is completely effaced and dilated and lasts until the delivery of your baby.  This stage may last from 20 minutes to 2 hours.  This is the stage where you will feel an urge to push your baby out of your vagina.  You may feel stretching and burning pain, especially when the widest part of your baby's head passes through the vaginal opening (crowning).  Once your baby is  delivered, the umbilical cord will be clamped and cut. This usually occurs after waiting a period of 1-2 minutes after delivery.  Your baby will be placed on your bare chest (skin-to-skin contact) in an upright position and covered with a warm blanket. Watch your baby for feeding cues, like rooting or sucking, and help the baby to your breast for his or her first feeding. Stage 3  This stage starts immediately after the birth of your baby and ends after you deliver the placenta.  This stage may take anywhere from 5 to 30 minutes.  After your baby has been delivered, you will feel contractions as your body expels the placenta and your uterus contracts to control bleeding. What can I expect after labor and delivery?  After labor is over, you and your baby will be monitored closely until you are ready to go home to ensure that you are both healthy. Your health care team will teach you how to care for yourself and your baby.  You and your baby  will stay in the same room (rooming in) during your hospital stay. This will encourage early bonding and successful breastfeeding.  You may continue to receive fluids and medicines through an IV.  Your uterus will be checked and massaged regularly (fundal massage).  You will have some soreness and pain in your abdomen, vagina, and the area of skin between your vaginal opening and your anus (perineum).  If an incision was made near your vagina (episiotomy) or if you had some vaginal tearing during delivery, cold compresses may be placed on your episiotomy or your tear. This helps to reduce pain and swelling.  You may be given a squirt bottle to use instead of wiping when you go to the bathroom. To use the squirt bottle, follow these steps: ? Before you urinate, fill the squirt bottle with warm water. Do not use hot water. ? After you urinate, while you are sitting on the toilet, use the squirt bottle to rinse the area around your urethra and vaginal  opening. This rinses away any urine and blood. ? Fill the squirt bottle with clean water every time you use the bathroom.  It is normal to have vaginal bleeding after delivery. Wear a sanitary pad for vaginal bleeding and discharge. Summary  Vaginal delivery means that you will give birth by pushing your baby out of your birth canal (vagina).  Your health care provider may monitor your contractions (uterine monitoring) and your baby's heart rate (fetal monitoring).  Your health care provider may perform a physical exam.  Normal labor and delivery is divided into three stages.  After labor is over, you and your baby will be monitored closely until you are ready to go home. This information is not intended to replace advice given to you by your health care provider. Make sure you discuss any questions you have with your health care provider. Document Revised: 01/12/2018 Document Reviewed: 01/12/2018 Elsevier Patient Education  2020 [employer].

## 2020-11-21 ENCOUNTER — Ambulatory Visit: Payer: Self-pay | Admitting: *Deleted

## 2020-11-21 ENCOUNTER — Other Ambulatory Visit: Payer: Self-pay

## 2020-11-21 ENCOUNTER — Ambulatory Visit: Payer: Self-pay | Attending: Obstetrics and Gynecology

## 2020-11-21 DIAGNOSIS — O24410 Gestational diabetes mellitus in pregnancy, diet controlled: Secondary | ICD-10-CM | POA: Insufficient documentation

## 2020-11-21 DIAGNOSIS — Z362 Encounter for other antenatal screening follow-up: Secondary | ICD-10-CM

## 2020-11-21 DIAGNOSIS — O0990 Supervision of high risk pregnancy, unspecified, unspecified trimester: Secondary | ICD-10-CM

## 2020-11-21 DIAGNOSIS — O09293 Supervision of pregnancy with other poor reproductive or obstetric history, third trimester: Secondary | ICD-10-CM

## 2020-11-21 DIAGNOSIS — Z3A36 36 weeks gestation of pregnancy: Secondary | ICD-10-CM

## 2020-11-26 ENCOUNTER — Other Ambulatory Visit (HOSPITAL_COMMUNITY)
Admission: RE | Admit: 2020-11-26 | Discharge: 2020-11-26 | Disposition: A | Discharge status: Home / Self Care | Payer: Self-pay | Source: Ambulatory Visit | Attending: Obstetrics & Gynecology | Admitting: Obstetrics & Gynecology

## 2020-11-26 ENCOUNTER — Other Ambulatory Visit: Payer: Self-pay

## 2020-11-26 ENCOUNTER — Ambulatory Visit (INDEPENDENT_AMBULATORY_CARE_PROVIDER_SITE_OTHER): Payer: Self-pay | Admitting: Obstetrics & Gynecology

## 2020-11-26 VITALS — BP 126/84 | HR 87 | Wt 184.5 lb

## 2020-11-26 DIAGNOSIS — O0990 Supervision of high risk pregnancy, unspecified, unspecified trimester: Secondary | ICD-10-CM

## 2020-11-26 DIAGNOSIS — O24410 Gestational diabetes mellitus in pregnancy, diet controlled: Secondary | ICD-10-CM

## 2020-11-26 NOTE — Progress Notes (Signed)
Patient ID: Erica Hall, female   DOB: 01-Dec-1996, 24 y.o.   MRN: [id number]    PRENATAL VISIT NOTE  Subjective:  Erica Hall is a 24 y.o. G2P0010 at [redacted]w[redacted]d being seen today for ongoing prenatal care.  She is currently monitored for the following issues for this low-risk pregnancy and has Encounter for supervision of high risk pregnancy in second trimester, antepartum; Bilateral swelling of feet and ankles; Diet controlled gestational diabetes mellitus (GDM) in third trimester; Supervision of high risk pregnancy, antepartum; History of molar pregnancy; and [redacted] weeks gestation of pregnancy on their problem list.  Patient reports no complaints.  Contractions: Irritability. Vag. Bleeding: None.  Movement: Present. Denies leaking of fluid.   The following portions of the patient's history were reviewed and updated as appropriate: allergies, current medications, past family history, past medical history, past social history, past surgical history and problem list.   Objective:   Vitals:   11/26/20 1132  BP: 126/84  Pulse: 87  Weight: 184 lb 8 oz (83.7 kg)    Fetal Status: Fetal Heart Rate (bpm): 136   Movement: Present     General:  Alert, oriented and cooperative. Patient is in no acute distress.  Skin: Skin is warm and dry. No rash noted.   Cardiovascular: Normal heart rate noted  Respiratory: Normal respiratory effort, no problems with respiration noted  Abdomen: Soft, gravid, appropriate for gestational age.  Pain/Pressure: Present     Pelvic: Cervical exam performed in the presence of a chaperone      0/0/-4  Extremities: Normal range of motion.  Edema: Mild pitting, slight indentation  Mental Status: Normal mood and affect. Normal behavior. Normal judgment and thought content.   Assessment and Plan:  Pregnancy: G2P0010 at [redacted]w[redacted]d 1. Supervision of high risk pregnancy, antepartum - GC/Chlamydia probe amp ([place])not at [facility] - Culture, beta strep (group b only)   2. Diet controlled gestational diabetes mellitus (GDM), antepartum Well controlled on diet, log reviewed.   Preterm labor symptoms and general obstetric precautions including but not limited to vaginal bleeding, contractions, leaking of fluid and fetal movement were reviewed in detail with the patient. Please refer to After Visit Summary for other counseling recommendations.   Return in 1 week (on 12/03/2020).  Future Appointments  Date Time Provider [place]  12/03/2020  8:15 AM Chancy Milroy, MD [facility]  12/10/2020  8:15 AM Cherre Blanc, MD [facility]  12/17/2020  8:15 AM Woodroe Mode, MD [facility]    Cherre Blanc, MD

## 2020-11-26 NOTE — Progress Notes (Signed)
Pt has Paper log today.

## 2020-11-26 NOTE — Patient Instructions (Signed)
Third Trimester of Pregnancy The third trimester is from week 28 through week 40 (months 7 through 9). The third trimester is a time when the unborn baby (fetus) is growing rapidly. At the end of the ninth month, the fetus is about 20 inches in length and weighs 6-10 pounds. Body changes during your third trimester Your body will continue to go through many changes during pregnancy. The changes vary from woman to woman. During the third trimester:  Your weight will continue to increase. You can expect to gain 25-35 pounds (11-16 kg) by the end of the pregnancy.  You may begin to get stretch marks on your hips, abdomen, and breasts.  You may urinate more often because the fetus is moving lower into your pelvis and pressing on your bladder.  You may develop or continue to have heartburn. This is caused by increased hormones that slow down muscles in the digestive tract.  You may develop or continue to have constipation because increased hormones slow digestion and cause the muscles that push waste through your intestines to relax.  You may develop hemorrhoids. These are swollen veins (varicose veins) in the rectum that can itch or be painful.  You may develop swollen, bulging veins (varicose veins) in your legs.  You may have increased body aches in the pelvis, back, or thighs. This is due to weight gain and increased hormones that are relaxing your joints.  You may have changes in your hair. These can include thickening of your hair, rapid growth, and changes in texture. Some women also have hair loss during or after pregnancy, or hair that feels dry or thin. Your hair will most likely return to normal after your baby is born.  Your breasts will continue to grow and they will continue to become tender. A yellow fluid (colostrum) may leak from your breasts. This is the first milk you are producing for your baby.  Your belly button may stick out.  You may notice more swelling in your hands,  face, or ankles.  You may have increased tingling or numbness in your hands, arms, and legs. The skin on your belly may also feel numb.  You may feel short of breath because of your expanding uterus.  You may have more problems sleeping. This can be caused by the size of your belly, increased need to urinate, and an increase in your body's metabolism.  You may notice the fetus "dropping," or moving lower in your abdomen (lightening).  You may have increased vaginal discharge.  You may notice your joints feel loose and you may have pain around your pelvic bone. What to expect at prenatal visits You will have prenatal exams every 2 weeks until week 36. Then you will have weekly prenatal exams. During a routine prenatal visit:  You will be weighed to make sure you and the baby are growing normally.  Your blood pressure will be taken.  Your abdomen will be measured to track your baby's growth.  The fetal heartbeat will be listened to.  Any test results from the previous visit will be discussed.  You may have a cervical check near your due date to see if your cervix has softened or thinned (effaced).  You will be tested for Group B streptococcus. This happens between 35 and 37 weeks. Your health care provider may ask you:  What your birth plan is.  How you are feeling.  If you are feeling the baby move.  If you have had any abnormal   symptoms, such as leaking fluid, bleeding, severe headaches, or abdominal cramping.  If you are using any tobacco products, including cigarettes, chewing tobacco, and electronic cigarettes.  If you have any questions. Other tests or screenings that may be performed during your third trimester include:  Blood tests that check for low iron levels (anemia).  Fetal testing to check the health, activity level, and growth of the fetus. Testing is done if you have certain medical conditions or if there are problems during the pregnancy.  Nonstress test  (NST). This test checks the health of your baby to make sure there are no signs of problems, such as the baby not getting enough oxygen. During this test, a belt is placed around your belly. The baby is made to move, and its heart rate is monitored during movement. What is false labor? False labor is a condition in which you feel small, irregular tightenings of the muscles in the womb (contractions) that usually go away with rest, changing position, or drinking water. These are called Braxton Hicks contractions. Contractions may last for hours, days, or even weeks before true labor sets in. If contractions come at regular intervals, become more frequent, increase in intensity, or become painful, you should see your health care provider. What are the signs of labor?  Abdominal cramps.  Regular contractions that start at 10 minutes apart and become stronger and more frequent with time.  Contractions that start on the top of the uterus and spread down to the lower abdomen and back.  Increased pelvic pressure and dull back pain.  A watery or bloody mucus discharge that comes from the vagina.  Leaking of amniotic fluid. This is also known as your "water breaking." It could be a slow trickle or a gush. Let your health care provider know if it has a color or strange odor. If you have any of these signs, call your health care provider right away, even if it is before your due date. Follow these instructions at home: Medicines  Follow your health care provider's instructions regarding medicine use. Specific medicines may be either safe or unsafe to take during pregnancy.  Take a prenatal vitamin that contains at least 600 micrograms (mcg) of folic acid.  If you develop constipation, try taking a stool softener if your health care provider approves. Eating and drinking   Eat a balanced diet that includes fresh fruits and vegetables, whole grains, good sources of protein such as meat, eggs, or tofu,  and low-fat dairy. Your health care provider will help you determine the amount of weight gain that is right for you.  Avoid raw meat and uncooked cheese. These carry germs that can cause birth defects in the baby.  If you have low calcium intake from food, talk to your health care provider about whether you should take a daily calcium supplement.  Eat four or five small meals rather than three large meals a day.  Limit foods that are high in fat and processed sugars, such as fried and sweet foods.  To prevent constipation: ? Drink enough fluid to keep your urine clear or pale yellow. ? Eat foods that are high in fiber, such as fresh fruits and vegetables, whole grains, and beans. Activity  Exercise only as directed by your health care provider. Most women can continue their usual exercise routine during pregnancy. Try to exercise for 30 minutes at least 5 days a week. Stop exercising if you experience uterine contractions.  Avoid heavy lifting.  Do   not exercise in extreme heat or humidity, or at high altitudes.  Wear low-heel, comfortable shoes.  Practice good posture.  You may continue to have sex unless your health care provider tells you otherwise. Relieving pain and discomfort  Take frequent breaks and rest with your legs elevated if you have leg cramps or low back pain.  Take warm sitz baths to soothe any pain or discomfort caused by hemorrhoids. Use hemorrhoid cream if your health care provider approves.  Wear a good support bra to prevent discomfort from breast tenderness.  If you develop varicose veins: ? Wear support pantyhose or compression stockings as told by your healthcare provider. ? Elevate your feet for 15 minutes, 3-4 times a day. Prenatal care  Write down your questions. Take them to your prenatal visits.  Keep all your prenatal visits as told by your health care provider. This is important. Safety  Wear your seat belt at all times when driving.  Make  a list of emergency phone numbers, including numbers for family, friends, the hospital, and police and fire departments. General instructions  Avoid cat litter boxes and soil used by cats. These carry germs that can cause birth defects in the baby. If you have a cat, ask someone to clean the litter box for you.  Do not travel far distances unless it is absolutely necessary and only with the approval of your health care provider.  Do not use hot tubs, steam rooms, or saunas.  Do not drink alcohol.  Do not use any products that contain nicotine or tobacco, such as cigarettes and e-cigarettes. If you need help quitting, ask your health care provider.  Do not use any medicinal herbs or unprescribed drugs. These chemicals affect the formation and growth of the baby.  Do not douche or use tampons or scented sanitary pads.  Do not cross your legs for long periods of time.  To prepare for the arrival of your baby: ? Take prenatal classes to understand, practice, and ask questions about labor and delivery. ? Make a trial run to the hospital. ? Visit the hospital and tour the maternity area. ? Arrange for maternity or paternity leave through employers. ? Arrange for family and friends to take care of pets while you are in the hospital. ? Purchase a rear-facing car seat and make sure you know how to install it in your car. ? Pack your hospital bag. ? Prepare the baby's nursery. Make sure to remove all pillows and stuffed animals from the baby's crib to prevent suffocation.  Visit your dentist if you have not gone during your pregnancy. Use a soft toothbrush to brush your teeth and be gentle when you floss. Contact a health care provider if:  You are unsure if you are in labor or if your water has broken.  You become dizzy.  You have mild pelvic cramps, pelvic pressure, or nagging pain in your abdominal area.  You have lower back pain.  You have persistent nausea, vomiting, or  diarrhea.  You have an unusual or bad smelling vaginal discharge.  You have pain when you urinate. Get help right away if:  Your water breaks before 37 weeks.  You have regular contractions less than 5 minutes apart before 37 weeks.  You have a fever.  You are leaking fluid from your vagina.  You have spotting or bleeding from your vagina.  You have severe abdominal pain or cramping.  You have rapid weight loss or weight gain.  You have   shortness of breath with chest pain.  You notice sudden or extreme swelling of your face, hands, ankles, feet, or legs.  Your baby makes fewer than 10 movements in 2 hours.  You have severe headaches that do not go away when you take medicine.  You have vision changes. Summary  The third trimester is from week 28 through week 40, months 7 through 9. The third trimester is a time when the unborn baby (fetus) is growing rapidly.  During the third trimester, your discomfort may increase as you and your baby continue to gain weight. You may have abdominal, leg, and back pain, sleeping problems, and an increased need to urinate.  During the third trimester your breasts will keep growing and they will continue to become tender. A yellow fluid (colostrum) may leak from your breasts. This is the first milk you are producing for your baby.  False labor is a condition in which you feel small, irregular tightenings of the muscles in the womb (contractions) that eventually go away. These are called Braxton Hicks contractions. Contractions may last for hours, days, or even weeks before true labor sets in.  Signs of labor can include: abdominal cramps; regular contractions that start at 10 minutes apart and become stronger and more frequent with time; watery or bloody mucus discharge that comes from the vagina; increased pelvic pressure and dull back pain; and leaking of amniotic fluid. This information is not intended to replace advice given to you by your  health care provider. Make sure you discuss any questions you have with your health care provider. Document Revised: 03/31/2019 Document Reviewed: 01/13/2017 Elsevier Patient Education  2020 [employer]  

## 2020-11-27 LAB — GC/CHLAMYDIA PROBE AMP (~~LOC~~) NOT AT ARMC
Chlamydia: NEGATIVE
Comment: NEGATIVE
Comment: NORMAL
Neisseria Gonorrhea: NEGATIVE

## 2020-11-30 LAB — CULTURE, BETA STREP (GROUP B ONLY): Strep Gp B Culture: NEGATIVE

## 2020-12-03 ENCOUNTER — Encounter: Payer: Self-pay | Admitting: Obstetrics and Gynecology

## 2020-12-03 ENCOUNTER — Ambulatory Visit (INDEPENDENT_AMBULATORY_CARE_PROVIDER_SITE_OTHER): Payer: Self-pay | Admitting: Obstetrics and Gynecology

## 2020-12-03 ENCOUNTER — Other Ambulatory Visit: Payer: Self-pay

## 2020-12-03 VITALS — BP 126/85 | HR 79 | Wt 182.0 lb

## 2020-12-03 DIAGNOSIS — O24410 Gestational diabetes mellitus in pregnancy, diet controlled: Secondary | ICD-10-CM

## 2020-12-03 DIAGNOSIS — O0990 Supervision of high risk pregnancy, unspecified, unspecified trimester: Secondary | ICD-10-CM

## 2020-12-03 DIAGNOSIS — Z8759 Personal history of other complications of pregnancy, childbirth and the puerperium: Secondary | ICD-10-CM

## 2020-12-03 NOTE — Progress Notes (Signed)
Pt has Glucose Meter with her for readings

## 2020-12-03 NOTE — Progress Notes (Signed)
Subjective:  Erica Hall is a 24 y.o. G2P0010 at [redacted]w[redacted]d being seen today for ongoing prenatal care.  She is currently monitored for the following issues for this high-risk pregnancy and has Encounter for supervision of high risk pregnancy in second trimester, antepartum; Bilateral swelling of feet and ankles; Diet controlled gestational diabetes mellitus (GDM) in third trimester; Supervision of high risk pregnancy, antepartum; and History of molar pregnancy on their problem list.  Patient reports general discomforts of pregnancy.  Contractions: Irritability. Vag. Bleeding: None.  Movement: Present. Denies leaking of fluid.   The following portions of the patient's history were reviewed and updated as appropriate: allergies, current medications, past family history, past medical history, past social history, past surgical history and problem list. Problem list updated.  Objective:   Vitals:   12/03/20 0836  BP: 126/85  Pulse: 79  Weight: 182 lb (82.6 kg)    Fetal Status: Fetal Heart Rate (bpm): 132   Movement: Present     General:  Alert, oriented and cooperative. Patient is in no acute distress.  Skin: Skin is warm and dry. No rash noted.   Cardiovascular: Normal heart rate noted  Respiratory: Normal respiratory effort, no problems with respiration noted  Abdomen: Soft, gravid, appropriate for gestational age. Pain/Pressure: Present     Pelvic:  Cervical exam deferred        Extremities: Normal range of motion.  Edema: Trace  Mental Status: Normal mood and affect. Normal behavior. Normal judgment and thought content.   Urinalysis:      Assessment and Plan:  Pregnancy: G2P0010 at [redacted]w[redacted]d  1. Supervision of high risk pregnancy, antepartum Labor precautions  2. Diet controlled gestational diabetes mellitus (GDM) in third trimester CBG's in goal range Growth 23 % 11/21/20  3. History of molar pregnancy Placenta to pathology at delivery  Term labor symptoms and general  obstetric precautions including but not limited to vaginal bleeding, contractions, leaking of fluid and fetal movement were reviewed in detail with the patient. Please refer to After Visit Summary for other counseling recommendations.  Return in about 1 week (around 12/10/2020) for OB visit, face to face, MD only.   Chancy Milroy, MD

## 2020-12-10 ENCOUNTER — Encounter: Payer: Self-pay | Admitting: Obstetrics & Gynecology

## 2020-12-10 ENCOUNTER — Other Ambulatory Visit: Payer: Self-pay

## 2020-12-10 ENCOUNTER — Ambulatory Visit (INDEPENDENT_AMBULATORY_CARE_PROVIDER_SITE_OTHER): Payer: Self-pay | Admitting: Obstetrics & Gynecology

## 2020-12-10 ENCOUNTER — Inpatient Hospital Stay (HOSPITAL_COMMUNITY)
Admission: AD | Admit: 2020-12-10 | Discharge: 2020-12-10 | Disposition: A | Discharge status: Home / Self Care | Payer: Self-pay | Attending: Family Medicine | Admitting: Family Medicine

## 2020-12-10 ENCOUNTER — Encounter (HOSPITAL_COMMUNITY): Payer: Self-pay | Admitting: Obstetrics & Gynecology

## 2020-12-10 DIAGNOSIS — O0993 Supervision of high risk pregnancy, unspecified, third trimester: Secondary | ICD-10-CM | POA: Insufficient documentation

## 2020-12-10 DIAGNOSIS — O0990 Supervision of high risk pregnancy, unspecified, unspecified trimester: Secondary | ICD-10-CM

## 2020-12-10 DIAGNOSIS — R030 Elevated blood-pressure reading, without diagnosis of hypertension: Secondary | ICD-10-CM | POA: Insufficient documentation

## 2020-12-10 DIAGNOSIS — Z3A38 38 weeks gestation of pregnancy: Secondary | ICD-10-CM | POA: Insufficient documentation

## 2020-12-10 LAB — COMPREHENSIVE METABOLIC PANEL
ALT: 14 U/L (ref 0–44)
AST: 18 U/L (ref 15–41)
Albumin: 2.6 g/dL — ABNORMAL LOW (ref 3.5–5.0)
Alkaline Phosphatase: 139 U/L — ABNORMAL HIGH (ref 38–126)
Anion gap: 10 (ref 5–15)
BUN: 9 mg/dL (ref 6–20)
CO2: 21 mmol/L — ABNORMAL LOW (ref 22–32)
Calcium: 8.5 mg/dL — ABNORMAL LOW (ref 8.9–10.3)
Chloride: 105 mmol/L (ref 98–111)
Creatinine, Ser: 0.61 mg/dL (ref 0.44–1.00)
GFR, Estimated: >60 mL/min (ref >60)
Glucose, Bld: 82 mg/dL (ref 70–99)
Potassium: 3.7 mmol/L (ref 3.5–5.1)
Sodium: 136 mmol/L (ref 135–145)
Total Bilirubin: 1 mg/dL (ref 0.3–1.2)
Total Protein: 5.8 g/dL — ABNORMAL LOW (ref 6.5–8.1)

## 2020-12-10 LAB — URINALYSIS, ROUTINE W REFLEX MICROSCOPIC
Bilirubin Urine: NEGATIVE
Glucose, UA: NEGATIVE mg/dL
Hgb urine dipstick: NEGATIVE
Ketones, ur: NEGATIVE mg/dL
Nitrite: NEGATIVE
Protein, ur: NEGATIVE mg/dL
Specific Gravity, Urine: 1.01 (ref 1.005–1.030)
pH: 6 (ref 5.0–8.0)

## 2020-12-10 LAB — CBC
HCT: 38.3 % (ref 36.0–46.0)
Hemoglobin: 13.3 g/dL (ref 12.0–15.0)
MCH: 32.1 pg (ref 26.0–34.0)
MCHC: 34.7 g/dL (ref 30.0–36.0)
MCV: 92.5 fL (ref 80.0–100.0)
Platelets: 258 10*3/uL (ref 150–400)
RBC: 4.14 MIL/uL (ref 3.87–5.11)
RDW: 13.4 % (ref 11.5–15.5)
WBC: 8.4 10*3/uL (ref 4.0–10.5)
nRBC: 0 % (ref 0.0–0.2)

## 2020-12-10 LAB — POCT URINALYSIS DIP (DEVICE)
Bilirubin Urine: NEGATIVE
Glucose, UA: NEGATIVE mg/dL
Hgb urine dipstick: NEGATIVE
Ketones, ur: NEGATIVE mg/dL
Leukocytes,Ua: NEGATIVE
Nitrite: NEGATIVE
Protein, ur: NEGATIVE mg/dL
Specific Gravity, Urine: >=1.03 (ref 1.005–1.030)
Urobilinogen, UA: 0.2 mg/dL (ref 0.0–1.0)
pH: 5.5 (ref 5.0–8.0)

## 2020-12-10 LAB — PROTEIN / CREATININE RATIO, URINE
Creatinine, Urine: 61.32 mg/dL
Protein Creatinine Ratio: 0.11 mg/mg{Cre} (ref 0.00–0.15)
Total Protein, Urine: 7 mg/dL

## 2020-12-10 NOTE — Progress Notes (Signed)
   PRENATAL VISIT NOTE  Subjective:  Erica Hall is a 24 y.o. G2P0010 at [redacted]w[redacted]d being seen today for ongoing prenatal care.  She is currently monitored for the following issues for this high-risk pregnancy and has Encounter for supervision of high risk pregnancy in second trimester, antepartum; Bilateral swelling of feet and ankles; Diet controlled gestational diabetes mellitus (GDM) in third trimester; Supervision of high risk pregnancy, antepartum; and History of molar pregnancy on their problem list.  Patient reports HA, tension in nature, denies RUQ pain, vision changes.  Contractions: Irritability. Vag. Bleeding: None.  Movement: Present. Denies leaking of fluid.   The following portions of the patient's history were reviewed and updated as appropriate: allergies, current medications, past family history, past medical history, past social history, past surgical history and problem list.   Objective:   Vitals:   12/10/20 1537 12/10/20 1549  BP: (!) 137/91 (!) 143/83  Pulse:  86  Weight: 183 lb 3.2 oz (83.1 kg)     Fetal Status: Fetal Heart Rate (bpm): 136   Movement: Present     General:  Alert, oriented and cooperative. Patient is in no acute distress.  Skin: Skin is warm and dry. No rash noted.   Cardiovascular: Normal heart rate noted  Respiratory: Normal respiratory effort, no problems with respiration noted  Abdomen: Soft, gravid, appropriate for gestational age.  Pain/Pressure: Present     Pelvic: Cervical exam deferred        Extremities: Normal range of motion.  Edema: Trace  Mental Status: Normal mood and affect. Normal behavior. Normal judgment and thought content.   Assessment and Plan:  Pregnancy: G2P0010 at [redacted]w[redacted]d 1. Supervision of high risk pregnancy, antepartum Elevated BP w/ HA- Will send to MAU for further assessment and possible delivery.   Term labor symptoms and general obstetric precautions including but not limited to vaginal bleeding,  contractions, leaking of fluid and fetal movement were reviewed in detail with the patient. Please refer to After Visit Summary for other counseling recommendations.   No follow-ups on file.  Future Appointments  Date Time Provider [place]  12/17/2020  3:15 PM Woodroe Mode, MD [facility]    Cherre Blanc, MD

## 2020-12-10 NOTE — MAU Provider Note (Signed)
History     CSN: [id number]  Arrival date and time: 12/10/20 1747   Event Date/Time   First Provider Initiated Contact with Patient 12/10/20 1844      No chief complaint on file.  HPI Patient is a 24yo G2P0010 at 65w5dwho was sent to the MAU for evaluation of elevated BP in the office with a headache. Headache started earlier today, although she just got off work and frequently gets headaches at the end of work. This headache is her typical headache and is now resolved. Denies vision changes, nausea, vomiting, abdominal pain.  OB History    Gravida  2   Para      Term      Preterm      AB  1   Living        SAB      IAB      Ectopic      Multiple      Live Births              Past Medical History:  Diagnosis Date  . GERD (gastroesophageal reflux disease)    occasional -diet controlled, no meds  . Medical history non-contributory   . Molar pregnancy 2019    Past Surgical History:  Procedure Laterality Date  . DILATION AND EVACUATION N/A 12/30/2017   Procedure: DILATATION AND EVACUATION;  Surgeon: DSloan Leiter MD;  [facility];  Service: Gynecology;  Laterality: N/A;    Family History  Problem Relation Age of Onset  . Hypertension Father   . Hyperlipidemia Father     Social History   Tobacco Use  . Smoking status: Never Smoker  . Smokeless tobacco: Never Used  Vaping Use  . Vaping Use: Never used  Substance Use Topics  . Alcohol use: No  . Drug use: No    Allergies: No Known Allergies  Medications Prior to Admission  Medication Sig Dispense Refill Last Dose  . Blood Glucose Monitoring Suppl (ACCU-CHEK GUIDE ME) w/Device KIT 1 kit by Does not apply route daily. 1 kit 0 12/10/2020 at Unknown time  . glucose blood (ACCU-CHEK GUIDE) test strip Use as instructed 100 each 12 12/10/2020 at Unknown time  . Prenatal Vit-Fe Fumarate-FA (PRENATAL VITAMINS PO) Take by mouth.   12/10/2020 at Unknown time    Review of Systems Physical  Exam   Blood pressure 136/78, pulse 70, temperature 98 F (36.7 C), temperature source Oral, resp. rate 15, height 5' (1.524 m), weight 83.6 kg, last menstrual period 03/14/2020, SpO2 100 %, unknown if currently breastfeeding.  Physical Exam Vitals reviewed.  Constitutional:      Appearance: Normal appearance.  Cardiovascular:     Rate and Rhythm: Normal rate.  Pulmonary:     Effort: Pulmonary effort is normal.  Abdominal:     General: Abdomen is flat. There is no distension.     Palpations: Abdomen is soft. There is no mass.     Tenderness: There is no abdominal tenderness.     Hernia: No hernia is present.  Skin:    General: Skin is warm and dry.     Capillary Refill: Capillary refill takes less than 2 seconds.  Neurological:     General: No focal deficit present.     Mental Status: She is alert.  Psychiatric:        Mood and Affect: Mood normal.        Behavior: Behavior normal.        Thought Content:  Thought content normal.        Judgment: Judgment normal.    Results for orders placed or performed during the hospital encounter of 12/10/20 (from the past 24 hour(s))  Urinalysis, Routine w reflex microscopic Urine, Clean Catch     Status: Abnormal   Collection Time: 12/10/20  6:22 PM  Result Value Ref Range   Color, Urine YELLOW YELLOW   APPearance HAZY (A) CLEAR   Specific Gravity, Urine 1.010 1.005 - 1.030   pH 6.0 5.0 - 8.0   Glucose, UA NEGATIVE NEGATIVE mg/dL   Hgb urine dipstick NEGATIVE NEGATIVE   Bilirubin Urine NEGATIVE NEGATIVE   Ketones, ur NEGATIVE NEGATIVE mg/dL   Protein, ur NEGATIVE NEGATIVE mg/dL   Nitrite NEGATIVE NEGATIVE   Leukocytes,Ua SMALL (A) NEGATIVE   RBC / HPF 0-5 0 - 5 RBC/hpf   WBC, UA 0-5 0 - 5 WBC/hpf   Bacteria, UA FEW (A) NONE SEEN   Squamous Epithelial / LPF 0-5 0 - 5   Mucus PRESENT    Hyaline Casts, UA PRESENT   Protein / creatinine ratio, urine     Status: None   Collection Time: 12/10/20  6:40 PM  Result Value Ref Range    Creatinine, Urine 61.32 mg/dL   Total Protein, Urine 7 mg/dL   Protein Creatinine Ratio 0.11 0.00 - 0.15 mg/mg[Cre]  CBC     Status: None   Collection Time: 12/10/20  6:48 PM  Result Value Ref Range   WBC 8.4 4.0 - 10.5 K/uL   RBC 4.14 3.87 - 5.11 MIL/uL   Hemoglobin 13.3 12.0 - 15.0 g/dL   HCT 38.3 36.0 - 46.0 %   MCV 92.5 80.0 - 100.0 fL   MCH 32.1 26.0 - 34.0 pg   MCHC 34.7 30.0 - 36.0 g/dL   RDW 13.4 11.5 - 15.5 %   Platelets 258 150 - 400 K/uL   nRBC 0.0 0.0 - 0.2 %  Comprehensive metabolic panel     Status: Abnormal   Collection Time: 12/10/20  6:48 PM  Result Value Ref Range   Sodium 136 135 - 145 mmol/L   Potassium 3.7 3.5 - 5.1 mmol/L   Chloride 105 98 - 111 mmol/L   CO2 21 (L) 22 - 32 mmol/L   Glucose, Bld 82 70 - 99 mg/dL   BUN 9 6 - 20 mg/dL   Creatinine, Ser 0.61 0.44 - 1.00 mg/dL   Calcium 8.5 (L) 8.9 - 10.3 mg/dL   Total Protein 5.8 (L) 6.5 - 8.1 g/dL   Albumin 2.6 (L) 3.5 - 5.0 g/dL   AST 18 15 - 41 U/L   ALT 14 0 - 44 U/L   Alkaline Phosphatase 139 (H) 38 - 126 U/L   Total Bilirubin 1.0 0.3 - 1.2 mg/dL   GFR, Estimated >60 >60 mL/min   Anion gap 10 5 - 15     MAU Course  Procedures NST reactive  MDM No evidence of preeclampsia or GHTN.  Labs normal  Assessment and Plan  1. Supervision of high risk pregnancy, antepartum  2. [redacted] weeks gestation of pregnancy  Discharge to home Return precautions given.  Truett Mainland 12/10/2020, 6:48 PM

## 2020-12-10 NOTE — Discharge Instructions (Signed)
Preeclampsia and Eclampsia Preeclampsia is a serious condition that may develop during pregnancy. This condition causes high blood pressure and increased protein in your urine along with other symptoms, such as headaches and vision changes. These symptoms may develop as the condition gets worse. Preeclampsia may occur at 20 weeks of pregnancy or later. Diagnosing and treating preeclampsia early is very important. If not treated early, it can cause serious problems for you and your baby. One problem it can lead to is eclampsia. Eclampsia is a condition that causes muscle jerking or shaking (convulsions or seizures) and other serious problems for the mother. During pregnancy, delivering your baby may be the best treatment for preeclampsia or eclampsia. For most women, preeclampsia and eclampsia symptoms go away after giving birth. In rare cases, a woman may develop preeclampsia after giving birth (postpartum preeclampsia). This usually occurs within 48 hours after childbirth but may occur up to 6 weeks after giving birth. What are the causes? The cause of preeclampsia is not known. What increases the risk? The following risk factors make you more likely to develop preeclampsia:  Being pregnant for the first time.  Having had preeclampsia during a past pregnancy.  Having a family history of preeclampsia.  Having high blood pressure.  Being pregnant with more than one baby.  Being 35 or older.  Being African-American.  Having kidney disease or diabetes.  Having medical conditions such as lupus or blood diseases.  Being very overweight (obese). What are the signs or symptoms? The most common symptoms are:  Severe headaches.  Vision problems, such as blurred or double vision.  Abdominal pain, especially upper abdominal pain. Other symptoms that may develop as the condition gets worse include:  Sudden weight gain.  Sudden swelling of the hands, face, legs, and feet.  Severe nausea  and vomiting.  Numbness in the face, arms, legs, and feet.  Dizziness.  Urinating less than usual.  Slurred speech.  Convulsions or seizures. How is this diagnosed? There are no screening tests for preeclampsia. Your health care provider will ask you about symptoms and check for signs of preeclampsia during your prenatal visits. You may also have tests that include:  Checking your blood pressure.  Urine tests to check for protein. Your health care provider will check for this at every prenatal visit.  Blood tests.  Monitoring your baby's heart rate.  Ultrasound. How is this treated? You and your health care provider will determine the treatment approach that is best for you. Treatment may include:  Having more frequent prenatal exams to check for signs of preeclampsia, if you have an increased risk for preeclampsia.  Medicine to lower your blood pressure.  Staying in the hospital, if your condition is severe. There, treatment will focus on controlling your blood pressure and the amount of fluids in your body (fluid retention).  Taking medicine (magnesium sulfate) to prevent seizures. This may be given as an injection or through an IV.  Taking a low-dose aspirin during your pregnancy.  Delivering your baby early. You may have your labor started with medicine (induced), or you may have a cesarean delivery. Follow these instructions at home: Eating and drinking   Drink enough fluid to keep your urine pale yellow.  Avoid caffeine. Lifestyle  Do not use any products that contain nicotine or tobacco, such as cigarettes and e-cigarettes. If you need help quitting, ask your health care provider.  Do not use alcohol or drugs.  Avoid stress as much as possible. Rest and get   plenty of sleep. General instructions  Take over-the-counter and prescription medicines only as told by your health care provider.  When lying down, lie on your left side. This keeps pressure off your  major blood vessels.  When sitting or lying down, raise (elevate) your feet. Try putting some pillows underneath your lower legs.  Exercise regularly. Ask your health care provider what kinds of exercise are best for you.  Keep all follow-up and prenatal visits as told by your health care provider. This is important. How is this prevented? There is no known way of preventing preeclampsia or eclampsia from developing. However, to lower your risk of complications and detect problems early:  Get regular prenatal care. Your health care provider may be able to diagnose and treat the condition early.  Maintain a healthy weight. Ask your health care provider for help managing weight gain during pregnancy.  Work with your health care provider to manage any long-term (chronic) health conditions you have, such as diabetes or kidney problems.  You may have tests of your blood pressure and kidney function after giving birth.  Your health care provider may have you take low-dose aspirin during your next pregnancy. Contact a health care provider if:  You have symptoms that your health care provider told you may require more treatment or monitoring, such as: ? Headaches. ? Nausea or vomiting. ? Abdominal pain. ? Dizziness. ? Light-headedness. Get help right away if:  You have severe: ? Abdominal pain. ? Headaches that do not get better. ? Dizziness. ? Vision problems. ? Confusion. ? Nausea or vomiting.  You have any of the following: ? A seizure. ? Sudden, rapid weight gain. ? Sudden swelling in your hands, ankles, or face. ? Trouble moving any part of your body. ? Numbness in any part of your body. ? Trouble speaking. ? Abnormal bleeding.  You faint. Summary  Preeclampsia is a serious condition that may develop during pregnancy.  This condition causes high blood pressure and increased protein in your urine along with other symptoms, such as headaches and vision  changes.  Diagnosing and treating preeclampsia early is very important. If not treated early, it can cause serious problems for you and your baby.  Get help right away if you have symptoms that your health care provider told you to watch for. This information is not intended to replace advice given to you by your health care provider. Make sure you discuss any questions you have with your health care provider. Document Revised: 08/10/2018 Document Reviewed: 07/14/2016 Elsevier Patient Education  2020 [employer]  

## 2020-12-10 NOTE — MAU Note (Signed)
Sent for elevated BP, had a HA earlier- but it is gone now.  Denies visual changes, epigastric pain or increase in swelling.

## 2020-12-17 ENCOUNTER — Inpatient Hospital Stay (HOSPITAL_COMMUNITY): Payer: Medicaid Other | Admitting: Anesthesiology

## 2020-12-17 ENCOUNTER — Encounter: Payer: Self-pay | Admitting: Obstetrics & Gynecology

## 2020-12-17 ENCOUNTER — Encounter (HOSPITAL_COMMUNITY): Payer: Self-pay | Admitting: Family Medicine

## 2020-12-17 ENCOUNTER — Inpatient Hospital Stay (HOSPITAL_COMMUNITY)
Admission: AD | Admit: 2020-12-17 | Discharge: 2020-12-19 | DRG: 807 | Disposition: A | Discharge status: Home / Self Care | Payer: Medicaid Other | Attending: Obstetrics & Gynecology | Admitting: Obstetrics & Gynecology

## 2020-12-17 DIAGNOSIS — O24420 Gestational diabetes mellitus in childbirth, diet controlled: Secondary | ICD-10-CM | POA: Diagnosis present

## 2020-12-17 DIAGNOSIS — O0990 Supervision of high risk pregnancy, unspecified, unspecified trimester: Secondary | ICD-10-CM

## 2020-12-17 DIAGNOSIS — O24410 Gestational diabetes mellitus in pregnancy, diet controlled: Secondary | ICD-10-CM

## 2020-12-17 DIAGNOSIS — Z20822 Contact with and (suspected) exposure to covid-19: Secondary | ICD-10-CM | POA: Diagnosis present

## 2020-12-17 DIAGNOSIS — Z3A39 39 weeks gestation of pregnancy: Secondary | ICD-10-CM

## 2020-12-17 DIAGNOSIS — Z8759 Personal history of other complications of pregnancy, childbirth and the puerperium: Secondary | ICD-10-CM | POA: Diagnosis not present

## 2020-12-17 DIAGNOSIS — O26893 Other specified pregnancy related conditions, third trimester: Secondary | ICD-10-CM | POA: Diagnosis present

## 2020-12-17 DIAGNOSIS — O0992 Supervision of high risk pregnancy, unspecified, second trimester: Secondary | ICD-10-CM

## 2020-12-17 LAB — COMPREHENSIVE METABOLIC PANEL
ALT: 16 U/L (ref 0–44)
AST: 20 U/L (ref 15–41)
Albumin: 2.5 g/dL — ABNORMAL LOW (ref 3.5–5.0)
Alkaline Phosphatase: 148 U/L — ABNORMAL HIGH (ref 38–126)
Anion gap: 9 (ref 5–15)
BUN: 11 mg/dL (ref 6–20)
CO2: 19 mmol/L — ABNORMAL LOW (ref 22–32)
Calcium: 8.8 mg/dL — ABNORMAL LOW (ref 8.9–10.3)
Chloride: 108 mmol/L (ref 98–111)
Creatinine, Ser: 0.66 mg/dL (ref 0.44–1.00)
GFR, Estimated: >60 mL/min (ref >60)
Glucose, Bld: 115 mg/dL — ABNORMAL HIGH (ref 70–99)
Potassium: 3.6 mmol/L (ref 3.5–5.1)
Sodium: 136 mmol/L (ref 135–145)
Total Bilirubin: 0.6 mg/dL (ref 0.3–1.2)
Total Protein: 5.6 g/dL — ABNORMAL LOW (ref 6.5–8.1)

## 2020-12-17 LAB — CBC
HCT: 38.4 % (ref 36.0–46.0)
Hemoglobin: 13.1 g/dL (ref 12.0–15.0)
MCH: 31.9 pg (ref 26.0–34.0)
MCHC: 34.1 g/dL (ref 30.0–36.0)
MCV: 93.4 fL (ref 80.0–100.0)
Platelets: 236 10*3/uL (ref 150–400)
RBC: 4.11 MIL/uL (ref 3.87–5.11)
RDW: 13.6 % (ref 11.5–15.5)
WBC: 10.3 10*3/uL (ref 4.0–10.5)
nRBC: 0 % (ref 0.0–0.2)

## 2020-12-17 LAB — RESP PANEL BY RT-PCR (FLU A&B, COVID) ARPGX2
Influenza A by PCR: NEGATIVE
Influenza B by PCR: NEGATIVE
SARS Coronavirus 2 by RT PCR: NEGATIVE

## 2020-12-17 LAB — GLUCOSE, CAPILLARY: Glucose-Capillary: 83 mg/dL (ref 70–99)

## 2020-12-17 LAB — TYPE AND SCREEN
ABO/RH(D): A POS
Antibody Screen: NEGATIVE

## 2020-12-17 LAB — PROTEIN / CREATININE RATIO, URINE
Creatinine, Urine: 211.26 mg/dL
Protein Creatinine Ratio: 0.11 mg/mg{Cre} (ref 0.00–0.15)
Total Protein, Urine: 24 mg/dL

## 2020-12-17 MED ORDER — EPHEDRINE 5 MG/ML INJ: 10.0000 mg | INTRAVENOUS | Status: DC | PRN

## 2020-12-17 MED ORDER — PHENYLEPHRINE 40 MCG/ML (10ML) SYRINGE FOR IV PUSH (FOR BLOOD PRESSURE SUPPORT): 80.0000 ug | PREFILLED_SYRINGE | INTRAVENOUS | Status: DC | PRN

## 2020-12-17 MED ORDER — FENTANYL-BUPIVACAINE-NACL 0.5-0.125-0.9 MG/250ML-% EP SOLN
12.0000 mL/h | EPIDURAL | Status: DC | PRN
  Filled 2020-12-17: qty 250

## 2020-12-17 MED ORDER — OXYTOCIN BOLUS FROM INFUSION
333.0000 mL | Freq: Once | INTRAVENOUS | Status: AC
  Administered 2020-12-18: 333 mL via INTRAVENOUS

## 2020-12-17 MED ORDER — FENTANYL CITRATE (PF) 100 MCG/2ML IJ SOLN: 100.0000 ug | INTRAMUSCULAR | Status: DC | PRN

## 2020-12-17 MED ORDER — OXYTOCIN-SODIUM CHLORIDE 30-0.9 UT/500ML-% IV SOLN
2.5000 [IU]/h | INTRAVENOUS | Status: DC
  Filled 2020-12-17: qty 500

## 2020-12-17 MED ORDER — LIDOCAINE HCL (PF) 1 % IJ SOLN
INTRAMUSCULAR | Status: DC | PRN
  Administered 2020-12-17: 7 mL via EPIDURAL

## 2020-12-17 MED ORDER — SODIUM CHLORIDE (PF) 0.9 % IJ SOLN
INTRAMUSCULAR | Status: DC | PRN
  Administered 2020-12-17: 12 mL/h via EPIDURAL

## 2020-12-17 MED ORDER — ACETAMINOPHEN 325 MG PO TABS: 650.0000 mg | ORAL_TABLET | ORAL | Status: DC | PRN

## 2020-12-17 MED ORDER — ONDANSETRON HCL 4 MG/2ML IJ SOLN: 4.0000 mg | Freq: Four times a day (QID) | INTRAMUSCULAR | Status: DC | PRN

## 2020-12-17 MED ORDER — OXYCODONE-ACETAMINOPHEN 5-325 MG PO TABS
2.0000 | ORAL_TABLET | ORAL | Status: DC | PRN
Start: 2020-12-17 — End: 2020-12-18

## 2020-12-17 MED ORDER — LACTATED RINGERS IV SOLN
500.0000 mL | Freq: Once | INTRAVENOUS | Status: AC
  Administered 2020-12-17: 500 mL via INTRAVENOUS

## 2020-12-17 MED ORDER — SOD CITRATE-CITRIC ACID 500-334 MG/5ML PO SOLN: 30.0000 mL | ORAL | Status: DC | PRN

## 2020-12-17 MED ORDER — LACTATED RINGERS IV SOLN: INTRAVENOUS | Status: DC

## 2020-12-17 MED ORDER — LIDOCAINE HCL (PF) 1 % IJ SOLN: 30.0000 mL | INTRAMUSCULAR | Status: DC | PRN

## 2020-12-17 MED ORDER — LACTATED RINGERS IV SOLN
500.0000 mL | INTRAVENOUS | Status: DC | PRN
Start: 2020-12-17 — End: 2020-12-18

## 2020-12-17 MED ORDER — DIPHENHYDRAMINE HCL 50 MG/ML IJ SOLN: 12.5000 mg | INTRAMUSCULAR | Status: DC | PRN

## 2020-12-17 MED ORDER — OXYCODONE-ACETAMINOPHEN 5-325 MG PO TABS: 1.0000 | ORAL_TABLET | ORAL | Status: DC | PRN

## 2020-12-17 NOTE — Progress Notes (Addendum)
Patient Vitals for the past 4 hrs:  BP Temp Temp src Pulse Resp SpO2  12/17/20 2045 124/64 -- -- 64 16 99 %  12/17/20 2040 -- -- -- -- -- 99 %  12/17/20 2035 118/63 -- -- 64 -- 98 %  12/17/20 2030 119/73 98.5 F (36.9 C) Oral 72 16 99 %  12/17/20 2025 99/80 -- -- 78 -- 99 %  12/17/20 2022 126/65 -- -- 95 -- 99 %  12/17/20 2020 -- -- -- -- -- 99 %  12/17/20 2012 (!) 142/70 -- -- 81 -- 100 %  12/17/20 2005 -- -- -- -- -- 100 %  12/17/20 2000 131/86 -- -- 89 16 --  12/17/20 1859 135/79 98.6 F (37 C) Oral 70 20 --  12/17/20 1800 -- -- -- -- 20 --   Comfortable w/epidural.  FHR Cat 1. Ctx q 3-5 minutes.  Cx 8.5/100/0 sta.  AROM w/mod mec fluid. CBG 83 @ 1930. Continue expectant mgt.

## 2020-12-17 NOTE — MAU Note (Signed)
PT reports cntrx since 4 a.m. 6-7 mins apart. Denies LOF or VB. +FM. Has GDM- no meds.

## 2020-12-17 NOTE — Anesthesia Preprocedure Evaluation (Signed)
Anesthesia Evaluation  Patient identified by MRN, date of birth, ID band Patient awake    Reviewed: NPO status , Patient's Chart, lab work & pertinent test results  Airway Mallampati: II  TM Distance: >3 FB Neck ROM: Full    Dental  (+) Teeth Intact   Pulmonary neg pulmonary ROS,    Pulmonary exam normal        Cardiovascular negative cardio ROS   Rhythm:Regular Rate:Normal     Neuro/Psych negative neurological ROS  negative psych ROS   GI/Hepatic GERD  Controlled,  Endo/Other  diabetes, Well Controlled  Renal/GU negative Renal ROS  negative genitourinary   Musculoskeletal negative musculoskeletal ROS (+)   Abdominal (+)  Abdomen: soft. Bowel sounds: normal.  Peds  Hematology negative hematology ROS (+)   Anesthesia Other Findings   Reproductive/Obstetrics (+) Pregnancy                             Anesthesia Physical Anesthesia Plan  ASA: II  Anesthesia Plan: Epidural   Post-op Pain Management:    Induction:   PONV Risk Score and Plan: 2  Airway Management Planned: Natural Airway  Additional Equipment: None  Intra-op Plan:   Post-operative Plan:   Informed Consent: I have reviewed the patients History and Physical, chart, labs and discussed the procedure including the risks, benefits and alternatives for the proposed anesthesia with the patient or authorized representative who has indicated his/her understanding and acceptance.     Dental advisory given  Plan Discussed with:   Anesthesia Plan Comments: (Lab Results      Component                Value               Date                      WBC                      10.3                12/17/2020                HGB                      13.1                12/17/2020                HCT                      38.4                12/17/2020                MCV                      93.4                12/17/2020                 PLT                      236                 12/17/2020          )  Anesthesia Quick Evaluation  

## 2020-12-17 NOTE — H&P (Signed)
OBSTETRIC ADMISSION HISTORY AND PHYSICAL  Shada Nienaber is a 24 y.o. female G2P0010 with IUP at [redacted]w[redacted]d by L/7 presenting for spontaneous onset of labor. She reports +FMs, No LOF, no VB, no blurry vision, headaches or peripheral edema, and RUQ pain.  She plans on breast feeding. She is undecided for birth control.  She received her [facility]   Dating: By L/7 --->  Estimated Date of Delivery: 12/19/20  Sono:  $Remo'@[redacted]w[redacted]d'CZmuV$ , CWD, normal anatomy, cephalic presentation, 0086P, 23% EFW   Prenatal History/Complications:  -h/o molar pregnancy -A1GDM (BG levels well controled at home)  Past Medical History: Past Medical History:  Diagnosis Date  . GERD (gastroesophageal reflux disease)    occasional -diet controlled, no meds  . Medical history non-contributory   . Molar pregnancy 2019    Past Surgical History: Past Surgical History:  Procedure Laterality Date  . DILATION AND EVACUATION N/A 12/30/2017   Procedure: DILATATION AND EVACUATION;  Surgeon: Sloan Leiter, MD;  Location: [facility];  Service: Gynecology;  Laterality: N/A;    Obstetrical History: OB History    Gravida  2   Para      Term      Preterm      AB  1   Living        SAB      IAB      Ectopic      Multiple      Live Births              Social History Social History   Socioeconomic History  . Marital status: Single    Spouse name: Not on file  . Number of children: Not on file  . Years of education: Not on file  . Highest education level: Not on file  Occupational History  . Not on file  Tobacco Use  . Smoking status: Never Smoker  . Smokeless tobacco: Never Used  Vaping Use  . Vaping Use: Never used  Substance and Sexual Activity  . Alcohol use: No  . Drug use: No  . Sexual activity: Yes    Birth control/protection: None    Comment: approx [redacted] wks gestation - 1st pregnancy  Other Topics Concern  . Not on file  Social History Narrative  . Not on file   Social  Determinants of Health   Financial Resource Strain: Not on file  Food Insecurity: No Food Insecurity  . Worried About Charity fundraiser in the Last Year: Never true  . Ran Out of Food in the Last Year: Never true  Transportation Needs: No Transportation Needs  . Lack of Transportation (Medical): No  . Lack of Transportation (Non-Medical): No  Physical Activity: Not on file  Stress: Not on file  Social Connections: Not on file    Family History: Family History  Problem Relation Age of Onset  . Hypertension Father   . Hyperlipidemia Father     Allergies: No Known Allergies  Medications Prior to Admission  Medication Sig Dispense Refill Last Dose  . Blood Glucose Monitoring Suppl (ACCU-CHEK GUIDE ME) w/Device KIT 1 kit by Does not apply route daily. 1 kit 0   . glucose blood (ACCU-CHEK GUIDE) test strip Use as instructed 100 each 12   . Prenatal Vit-Fe Fumarate-FA (PRENATAL VITAMINS PO) Take by mouth.        Review of Systems   All systems reviewed and negative except as stated in HPI  Blood pressure 135/79, pulse 89,  temperature 98.4 F (36.9 C), resp. rate 18, height 5' (1.524 m), weight 84 kg, last menstrual period 03/14/2020, SpO2 99 %, unknown if currently breastfeeding. General appearance: alert, cooperative and appears stated age Lungs: normal WOB Heart: regular rate  Abdomen: soft, non-tender Extremities: no sign of DVT Presentation: cephalic Fetal monitoringBaseline: 130 bpm, Variability: Good {> 6 bpm), Accelerations: Reactive and Decelerations: Absent Uterine activityFrequency: Every 5 minutes Dilation: 4 Effacement (%): 80 Station: -2 Exam by:: [employer] RN  Prenatal labs: ABO, Rh: A/Positive/-- (06/22 1242) Antibody: Negative (06/22 1242) Rubella: 1.69 (06/22 1242) RPR: Non Reactive (10/11 1231)  HBsAg: Negative (06/22 1242)  HIV: Non Reactive (10/11 1231)  GBS: Negative/-- (12/06 1130)  3 hr Glucola abnormal (83/182/159/127 Genetic  screening  none Anatomy US wnl  Prenatal Transfer Tool  Maternal Diabetes: Yes:  Diabetes Type:  Diet controlled Genetic Screening: Declined Maternal Ultrasounds/Referrals: Normal Fetal Ultrasounds or other Referrals:  None Maternal Substance Abuse:  No Significant Maternal Medications:  None Significant Maternal Lab Results: Group B Strep negative  No results found for this or any previous visit (from the past 24 hour(s)).  Patient Active Problem List   Diagnosis Date Noted  . Supervision of high risk pregnancy, antepartum 09/10/2020  . 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 Diet controlled gestational diabetes mellitus (GDM) in third trimester 08/21/2020  . Bilateral swelling of feet and ankles 08/03/2020  . Encounter for supervision of high risk pregnancy in second trimester, antepartum 06/19/2020    Assessment/Plan:  Isel Skufca is a 24 y.o. G2P0010 at 66w5dhere for spontaneous onset of labor.  #Labor: Pt presented in latent labor with painful contractions. Of note, pt was scheduled for IOL on 12/29 given A1GDM. Will continue to manage expectantly and augment with pitocin as clinically indicated. #Pain: pt desires epidural #FWB: Category 1 strip #ID: GBS neg #MOF: breast #MOC: undecided; s/p counseling on admission #Circ: n/a #A1GDM: EFW 23% at 345w0ds noted above. BG levels well controlled at home. Will plan to trend BG levels in labor. Plan for repeat gtt at 6-8 weeks postpartum. #H/o Molar Pregnancy: Will plan to send placenta to pathology. Will trend beta-hcg levels in postpartum period.  GoRanda NgoMD OB Fellow, Faculty Practice 12/17/2020 4:37 PM

## 2020-12-17 NOTE — Discharge Summary (Signed)
Postpartum Discharge Summary  Date of Service updated 12/19/20     Patient Name: Erica Hall DOB: [birth year] MRN: [id number]  Date of admission: 12/17/2020 Delivery date:12/18/2020  Delivering provider: Christin Fudge  Date of discharge: 12/19/2020  Admitting diagnosis: Supervision of high-risk pregnancy [O09.90] Intrauterine pregnancy: [redacted]w[redacted]d    Secondary diagnosis:  Active Problems:   Encounter for supervision of high risk pregnancy in second trimester, antepartum   Diet controlled gestational diabetes mellitus (GDM) in third trimester   Supervision of high risk pregnancy, antepartum   History of molar pregnancy   Supervision of high-risk pregnancy   Vaginal delivery  Additional problems: A1DM; borderline HTN; hx molar pregnancy    Discharge diagnosis: Term Pregnancy Delivered and GDM A1                                              Post partum procedures: none Augmentation: AROM Complications: None  Hospital course: Onset of Labor With Vaginal Delivery      24y.o. yo G2P0010 at 366w5das admitted in Latent Labor on 12/17/2020. Patient had an uncomplicated labor course as follows:  Membrane Rupture Time/Date: 9:08 PM ,12/17/2020   Delivery Method:Vaginal, Spontaneous  Episiotomy: None  Lacerations:  1st degree  Patient had an uncomplicated postpartum course.  She is ambulating, tolerating a regular diet, passing flatus, and urinating well. Patient is discharged home in stable condition on 12/19/20.  Newborn Data: Birth date:12/18/2020  Birth time:1:27 AM  Gender:Female  Living status:Living  Apgars:8 ,9  Weight:2722 g    Magnesium Sulfate received: No BMZ received: No Rhophylac:N/A MMR:N/A T-DaP:Given prenatally Flu: Yes Transfusion:No  Physical exam  Vitals:   12/18/20 1315 12/18/20 1825 12/18/20 1935 12/19/20 0525  BP: 116/76 (!) 121/56 120/81 121/87  Pulse: 93 79 79 78  Resp: _0 Temp: 98.3 F (36.8 C) 97.8 F (36.6  C) 98.7 F (37.1 C) 97.7 F (36.5 C)  TempSrc: Oral Oral Oral Oral  SpO2: 99% 100% 99% 98%  Weight:      Height:       General: alert, cooperative and no distress Lochia: appropriate Uterine Fundus: firm Incision: N/A DVT Evaluation: No evidence of DVT seen on physical exam. Labs: Lab Results  Component Value Date   WBC 10.3 12/17/2020   HGB 13.1 12/17/2020   HCT 38.4 12/17/2020   MCV 93.4 12/17/2020   PLT 236 12/17/2020   CMP Latest Ref Rng & Units 12/17/2020  Glucose 70 - 99 mg/dL 115(H)  BUN 6 - 20 mg/dL 11  Creatinine 0.44 - 1.00 mg/dL 0.66  Sodium 135 - 145 mmol/L 136  Potassium 3.5 - 5.1 mmol/L 3.6  Chloride 98 - 111 mmol/L 108  CO2 22 - 32 mmol/L 19(L)  Calcium 8.9 - 10.3 mg/dL 8.8(L)  Total Protein 6.5 - 8.1 g/dL 5.6(L)  Total Bilirubin 0.3 - 1.2 mg/dL 0.6  Alkaline Phos 38 - 126 U/L 148(H)  AST 15 - 41 U/L 20  ALT 0 - 44 U/L 16   Edinburgh Score: Edinburgh Postnatal Depression Scale Screening Tool 12/18/2020  I have been able to laugh and see the funny side of things. 0  I have looked forward with enjoyment to things. 0  I have blamed myself unnecessarily when things went wrong. 2  I have been anxious or worried for no good reason. 0  I have felt scared or panicky for no good reason. 0  Things have been getting on top of me. 1  I have been so unhappy that I have had difficulty sleeping. 0  I have felt sad or miserable. 0  I have been so unhappy that I have been crying. 0  The thought of harming myself has occurred to me. 0  Edinburgh Postnatal Depression Scale Total 3     After visit meds:  Allergies as of 12/19/2020   No Known Allergies     Medication List    STOP taking these medications   Accu-Chek Guide Me w/Device Kit   Accu-Chek Guide test strip Generic drug: glucose blood     TAKE these medications   acetaminophen 500 MG tablet Commonly known as: TYLENOL Take 2 tablets (1,000 mg total) by mouth every 4 (four) hours as needed (for  pain scale < 4).   coconut oil Oil Apply 1 application topically as needed.   ibuprofen 600 MG tablet Commonly known as: ADVIL Take 1 tablet (600 mg total) by mouth every 6 (six) hours.   PRENATAL VITAMINS PO Take by mouth.        Discharge home in stable condition Infant Feeding: Breast Infant Disposition:home with mother Discharge instruction: per After Visit Summary and Postpartum booklet. Activity: Advance as tolerated. Pelvic rest for 6 weeks.  Diet: routine diet Future Appointments:No future appointments. Follow up Visit:   Please schedule this patient for a Virtual postpartum visit in 4 weeks with the following provider: Any provider. Additional Postpartum F/U:2 hour GTT  Low risk pregnancy complicated by: GDM Delivery mode:  Vaginal, Spontaneous  Anticipated Birth Control:  Unsure   12/19/2020 Alicia C Firestone, MD   

## 2020-12-17 NOTE — Anesthesia Procedure Notes (Signed)
Epidural Patient location during procedure: OB Start time: 12/17/2020 8:13 PM End time: 12/17/2020 8:22 PM  Staffing Anesthesiologist: Atilano Median, DO Performed: anesthesiologist   Preanesthetic Checklist Completed: patient identified, IV checked, site marked, risks and benefits discussed, surgical consent, monitors and equipment checked, pre-op evaluation and timeout performed  Epidural Patient position: sitting Prep: ChloraPrep Patient monitoring: heart rate, continuous pulse ox and blood pressure Approach: midline Location: L3-L4 Injection technique: LOR saline  Needle:  Needle type: Tuohy  Needle gauge: 17 G Needle length: 9 cm Needle insertion depth: 8 cm Catheter type: closed end flexible Catheter size: 20 Guage Catheter at skin depth: 13 cm Test dose: negative and 1.5% lidocaine  Assessment Events: blood not aspirated, injection not painful, no injection resistance and no paresthesia  Additional Notes Patient identified. Risks/Benefits/Options discussed with patient including but not limited to bleeding, infection, nerve damage, paralysis, failed block, incomplete pain control, headache, blood pressure changes, nausea, vomiting, reactions to medications, itching and postpartum back pain. Confirmed with bedside nurse the patient's most recent platelet count. Confirmed with patient that they are not currently taking any anticoagulation, have any bleeding history or any family history of bleeding disorders. Patient expressed understanding and wished to proceed. All questions were answered. Sterile technique was used throughout the entire procedure. Please see nursing notes for vital signs. Test dose was given through epidural catheter and negative prior to continuing to dose epidural or start infusion. Warning signs of high block given to the patient including shortness of breath, tingling/numbness in hands, complete motor block, or any concerning symptoms with  instructions to call for help. Patient was given instructions on fall risk and not to get out of bed. All questions and concerns addressed with instructions to call with any issues or inadequate analgesia.    Reason for block:procedure for pain

## 2020-12-18 ENCOUNTER — Encounter (HOSPITAL_COMMUNITY): Payer: Self-pay | Admitting: Family Medicine

## 2020-12-18 DIAGNOSIS — O24420 Gestational diabetes mellitus in childbirth, diet controlled: Secondary | ICD-10-CM

## 2020-12-18 DIAGNOSIS — O24419 Gestational diabetes mellitus in pregnancy, unspecified control: Secondary | ICD-10-CM

## 2020-12-18 LAB — RPR: RPR Ser Ql: NONREACTIVE

## 2020-12-18 LAB — GLUCOSE, CAPILLARY: Glucose-Capillary: 79 mg/dL (ref 70–99)

## 2020-12-18 MED ORDER — IBUPROFEN 600 MG PO TABS
600.0000 mg | ORAL_TABLET | Freq: Four times a day (QID) | ORAL | Status: DC
  Administered 2020-12-18 – 2020-12-19 (×6): 600 mg via ORAL
  Filled 2020-12-18 (×6): qty 1

## 2020-12-18 MED ORDER — BISACODYL 10 MG RE SUPP: 10.0000 mg | Freq: Every day | RECTAL | Status: DC | PRN

## 2020-12-18 MED ORDER — WITCH HAZEL-GLYCERIN EX PADS: 1.0000 "application " | MEDICATED_PAD | CUTANEOUS | Status: DC | PRN

## 2020-12-18 MED ORDER — DIPHENHYDRAMINE HCL 25 MG PO CAPS: 25.0000 mg | ORAL_CAPSULE | Freq: Four times a day (QID) | ORAL | Status: DC | PRN

## 2020-12-18 MED ORDER — MISOPROSTOL 200 MCG PO TABS
ORAL_TABLET | ORAL | Status: AC
  Administered 2020-12-18: 800 ug via RECTAL
  Filled 2020-12-18: qty 1

## 2020-12-18 MED ORDER — MISOPROSTOL 200 MCG PO TABS
ORAL_TABLET | ORAL | Status: AC
  Filled 2020-12-18: qty 1

## 2020-12-18 MED ORDER — ACETAMINOPHEN 325 MG PO TABS: 650.0000 mg | ORAL_TABLET | ORAL | Status: DC | PRN

## 2020-12-18 MED ORDER — DOCUSATE SODIUM 100 MG PO CAPS
100.0000 mg | ORAL_CAPSULE | Freq: Two times a day (BID) | ORAL | Status: DC
  Administered 2020-12-18 – 2020-12-19 (×2): 100 mg via ORAL
  Filled 2020-12-18 (×2): qty 1

## 2020-12-18 MED ORDER — MEDROXYPROGESTERONE ACETATE 150 MG/ML IM SUSP: 150.0000 mg | INTRAMUSCULAR | Status: DC | PRN

## 2020-12-18 MED ORDER — COCONUT OIL OIL
1.0000 "application " | TOPICAL_OIL | Status: DC | PRN
  Administered 2020-12-18: 1 via TOPICAL

## 2020-12-18 MED ORDER — ONDANSETRON HCL 4 MG/2ML IJ SOLN: 4.0000 mg | INTRAMUSCULAR | Status: DC | PRN

## 2020-12-18 MED ORDER — TETANUS-DIPHTH-ACELL PERTUSSIS 5-2.5-18.5 LF-MCG/0.5 IM SUSY: 0.5000 mL | PREFILLED_SYRINGE | Freq: Once | INTRAMUSCULAR | Status: DC

## 2020-12-18 MED ORDER — BENZOCAINE-MENTHOL 20-0.5 % EX AERO
1.0000 "application " | INHALATION_SPRAY | CUTANEOUS | Status: DC | PRN
  Administered 2020-12-18: 1 via TOPICAL
  Filled 2020-12-18: qty 56

## 2020-12-18 MED ORDER — SIMETHICONE 80 MG PO CHEW: 80.0000 mg | CHEWABLE_TABLET | ORAL | Status: DC | PRN

## 2020-12-18 MED ORDER — MEASLES, MUMPS & RUBELLA VAC IJ SOLR: 0.5000 mL | Freq: Once | INTRAMUSCULAR | Status: DC

## 2020-12-18 MED ORDER — MISOPROSTOL 200 MCG PO TABS
ORAL_TABLET | ORAL | Status: AC
  Filled 2020-12-18: qty 3

## 2020-12-18 MED ORDER — FLEET ENEMA 7-19 GM/118ML RE ENEM: 1.0000 | ENEMA | Freq: Every day | RECTAL | Status: DC | PRN

## 2020-12-18 MED ORDER — TRANEXAMIC ACID-NACL 1000-0.7 MG/100ML-% IV SOLN
INTRAVENOUS | Status: AC
  Administered 2020-12-18: 1000 mg via INTRAVENOUS
  Filled 2020-12-18: qty 100

## 2020-12-18 MED ORDER — METHYLERGONOVINE MALEATE 0.2 MG/ML IJ SOLN: 0.2000 mg | INTRAMUSCULAR | Status: DC | PRN

## 2020-12-18 MED ORDER — ONDANSETRON HCL 4 MG PO TABS: 4.0000 mg | ORAL_TABLET | ORAL | Status: DC | PRN

## 2020-12-18 MED ORDER — METHYLERGONOVINE MALEATE 0.2 MG PO TABS: 0.2000 mg | ORAL_TABLET | ORAL | Status: DC | PRN

## 2020-12-18 MED ORDER — PRENATAL MULTIVITAMIN CH
1.0000 | ORAL_TABLET | Freq: Every day | ORAL | Status: DC
  Administered 2020-12-18 – 2020-12-19 (×2): 1 via ORAL
  Filled 2020-12-18 (×2): qty 1

## 2020-12-18 MED ORDER — DIBUCAINE (PERIANAL) 1 % EX OINT: 1.0000 "application " | TOPICAL_OINTMENT | CUTANEOUS | Status: DC | PRN

## 2020-12-18 MED ORDER — MISOPROSTOL 200 MCG PO TABS: 800.0000 ug | ORAL_TABLET | Freq: Once | ORAL | Status: AC

## 2020-12-18 MED ORDER — TRANEXAMIC ACID-NACL 1000-0.7 MG/100ML-% IV SOLN: 1000.0000 mg | INTRAVENOUS | Status: AC

## 2020-12-18 MED ORDER — FERROUS SULFATE 325 (65 FE) MG PO TABS
325.0000 mg | ORAL_TABLET | ORAL | Status: DC
  Administered 2020-12-18: 325 mg via ORAL
  Filled 2020-12-18: qty 1

## 2020-12-18 NOTE — Anesthesia Postprocedure Evaluation (Signed)
Anesthesia Post Note  Patient: Erica Hall  Procedure(s) Performed: AN AD HOC LABOR EPIDURAL     Patient location during evaluation: Mother Baby Anesthesia Type: Epidural Level of consciousness: awake and alert and oriented Pain management: satisfactory to patient Vital Signs Assessment: post-procedure vital signs reviewed and stable Respiratory status: spontaneous breathing and nonlabored ventilation Cardiovascular status: stable Postop Assessment: no headache, no backache, no signs of nausea or vomiting, adequate PO intake, patient able to bend at knees and able to ambulate (patient up walking) Anesthetic complications: no   No complications documented.  Last Vitals:  Vitals:   12/18/20 0337 12/18/20 0453  BP: 132/64 128/63  Pulse: 81 81  Resp: 18 16  Temp: 37.1 C 37.1 C  SpO2:  98%    Last Pain:  Vitals:   12/18/20 0636  TempSrc:   PainSc: 0-No pain   Pain Goal: Patients Stated Pain Goal: 0 (12/18/20 0000)                 Madison Hickman

## 2020-12-18 NOTE — Lactation Note (Addendum)
This note was copied from a baby's chart. Lactation Consultation Note  Patient Name: Girl Anum Palecek [id number] Date: 12/18/2020 Reason for consult: Initial assessment;Mother's request;Difficult latch;Nipple pain/trauma;Maternal endocrine disorder Age:24    Infant is 39 weeks 16 hours. Mom noted breast feeding changes with pregnancy. She does not have a pump at home. Manual pump provided assembled and reviewed. Mom desires to EBF and stated she started formula because unsure of her milk supply.  Mom has widely spaced breasts. Nipples are short shafted but no signs of nipple trauma. Mom not able to latch infant at 1500 feeding. On arrival, infant swaddled. LC examined Mom's breasts, did compression and hand expression 0.5 ml of colostrum. Fed to infant via spoon.   Infant than latched in football on the left side for 15 minutes with signs of milk transfer.   Mom is also supplementing with formula Similac with Iron 20 cal/oz.   Plan 1. To feed based on cues 8-12 x in 24 hour period no more than 3 hours without an attempt. Mom to latch and look for signs of milk transfer with infant STS.           2. Dad to pump based on breastfeeding supplementation guide and hours after birth 7 ml with paced bottle feeding increase as tolerated.            3 Mom to use manual pump q 3hours for 10 minutes           4. WIC referral sent to [facility].

## 2020-12-18 NOTE — Progress Notes (Signed)
  Some pressure w/ctx.  FHR Cat 1.  Ctx q 3 minutes.  Cx 9/100/+1 per RN.  Continue expectant mgt.

## 2020-12-19 ENCOUNTER — Inpatient Hospital Stay (HOSPITAL_COMMUNITY): Admission: AD | Admit: 2020-12-19 | Payer: Self-pay | Source: Home / Self Care

## 2020-12-19 DIAGNOSIS — Z8759 Personal history of other complications of pregnancy, childbirth and the puerperium: Secondary | ICD-10-CM

## 2020-12-19 LAB — GLUCOSE, CAPILLARY: Glucose-Capillary: 75 mg/dL (ref 70–99)

## 2020-12-19 LAB — SURGICAL PATHOLOGY

## 2020-12-19 MED ORDER — ACETAMINOPHEN 500 MG PO TABS: 1000.0000 mg | ORAL_TABLET | ORAL | Status: DC | PRN

## 2020-12-19 MED ORDER — COCONUT OIL OIL: 1.0000 "application " | TOPICAL_OIL | 0 refills | Status: DC | PRN

## 2020-12-19 MED ORDER — IBUPROFEN 600 MG PO TABS: 600.0000 mg | ORAL_TABLET | Freq: Four times a day (QID) | ORAL | 0 refills | Status: DC

## 2020-12-19 NOTE — Lactation Note (Signed)
This note was copied from a baby's chart. Lactation Consultation Note  Patient Name: Erica Hall [id number] Date: 12/19/2020 Reason for consult: Follow-up assessment Age:22 hours  Consult was done in Spanish:  Follow up to 35 hours old infant with 4.30% weight loss. Mother reports infant is latching well at times and falls sleep. Mother started supplementing with formula and infant is taking 23-35 mL.  Discussed strategies to keep infant awake at breast and encouraged mother to unswaddle/undress infant prior to feedings.  Mother requests assistance to latch infant. [facility] student assisted with pillows for support to left breast, modified cradle. Infant latches easily. LC demonstrated chin tug for a more comfortable latch. Infant breastfed for ~10 minutes. Provided comfort gels for nipple relief. Discussed engorgement signs and what to expect with milk coming in.  Reviewed hunger and fullness cues, signs of intake, feeding 8-12 times in 24 h period, normal newborn behavior and cluster feeding. Talked about paced bottle feeding and demonstrated technique. Praised mother for her efforts and dedication.   Feeding plan:  1. Breastfeed following hunger cues.  2. Offer breast 8 - 12 times in 24h period to establish good milk supply.   3. Ensuring infant has a deep latch and/or chin tugging to improve latch. 4. If needed supplement with formula following guideline volume per age, pace bottle feeding, frequent burping and upright position.  5. Use comfort gels for nipple relief 6. Encouraged maternal rest, hydration and food intake.  7. Contact Lactation Services or local resources for support, questions or concerns.    Maternal Data Formula Feeding for Exclusion: No Has patient been taught Hand Expression?: Yes Does the patient have breastfeeding experience prior to this delivery?: No  Feeding Feeding Type: Breast Fed Nipple Type: Slow - flow  LATCH Score Latch: Grasps breast  easily, tongue down, lips flanged, rhythmical sucking.  Audible Swallowing: Spontaneous and intermittent  Type of Nipple: Everted at rest and after stimulation (short shaft)  Comfort (Breast/Nipple): Filling, red/small blisters or bruises, mild/mod discomfort  Hold (Positioning): Assistance needed to correctly position infant at breast and maintain latch.  LATCH Score: 8  Interventions Interventions: Breast feeding basics reviewed;Assisted with latch;Skin to skin;Breast massage;Hand express;Pre-pump if needed;Adjust position;Support pillows;Position options;Expressed milk;Comfort gels;Hand pump  Consult Status Consult Status: Complete Date: 12/19/20 Follow-up type: Call as needed    Erica Hall 12/19/2020, 12:30 PM

## 2020-12-19 NOTE — Discharge Instructions (Signed)

## 2020-12-22 ENCOUNTER — Inpatient Hospital Stay (HOSPITAL_COMMUNITY)
Admission: AD | Admit: 2020-12-22 | Discharge: 2020-12-22 | Disposition: A | Discharge status: Home / Self Care | Payer: Self-pay | Attending: Obstetrics & Gynecology | Admitting: Obstetrics & Gynecology

## 2020-12-22 ENCOUNTER — Other Ambulatory Visit: Payer: Self-pay

## 2020-12-22 DIAGNOSIS — O9229 Other disorders of breast associated with pregnancy and the puerperium: Secondary | ICD-10-CM

## 2020-12-22 DIAGNOSIS — N6459 Other signs and symptoms in breast: Secondary | ICD-10-CM

## 2020-12-22 DIAGNOSIS — R030 Elevated blood-pressure reading, without diagnosis of hypertension: Secondary | ICD-10-CM | POA: Insufficient documentation

## 2020-12-22 DIAGNOSIS — O9279 Other disorders of lactation: Secondary | ICD-10-CM | POA: Insufficient documentation

## 2020-12-22 DIAGNOSIS — O99893 Other specified diseases and conditions complicating puerperium: Secondary | ICD-10-CM | POA: Insufficient documentation

## 2020-12-22 LAB — URINALYSIS, ROUTINE W REFLEX MICROSCOPIC
Bacteria, UA: NONE SEEN
Bilirubin Urine: NEGATIVE
Glucose, UA: NEGATIVE mg/dL
Ketones, ur: NEGATIVE mg/dL
Nitrite: NEGATIVE
Protein, ur: NEGATIVE mg/dL
Specific Gravity, Urine: 1.017 (ref 1.005–1.030)
pH: 6 (ref 5.0–8.0)

## 2020-12-22 LAB — CBC WITH DIFFERENTIAL/PLATELET
Abs Immature Granulocytes: 0.05 10*3/uL (ref 0.00–0.07)
Basophils Absolute: 0 10*3/uL (ref 0.0–0.1)
Basophils Relative: 0 %
Eosinophils Absolute: 0.1 10*3/uL (ref 0.0–0.5)
Eosinophils Relative: 1 %
HCT: 36.9 % (ref 36.0–46.0)
Hemoglobin: 12.1 g/dL (ref 12.0–15.0)
Immature Granulocytes: 1 %
Lymphocytes Relative: 24 %
Lymphs Abs: 1.9 10*3/uL (ref 0.7–4.0)
MCH: 31.3 pg (ref 26.0–34.0)
MCHC: 32.8 g/dL (ref 30.0–36.0)
MCV: 95.3 fL (ref 80.0–100.0)
Monocytes Absolute: 0.4 10*3/uL (ref 0.1–1.0)
Monocytes Relative: 5 %
Neutro Abs: 5.3 10*3/uL (ref 1.7–7.7)
Neutrophils Relative %: 69 %
Platelets: 283 10*3/uL (ref 150–400)
RBC: 3.87 MIL/uL (ref 3.87–5.11)
RDW: 13.3 % (ref 11.5–15.5)
WBC: 7.7 10*3/uL (ref 4.0–10.5)
nRBC: 0 % (ref 0.0–0.2)

## 2020-12-22 LAB — COMPREHENSIVE METABOLIC PANEL
ALT: 32 U/L (ref 0–44)
AST: 28 U/L (ref 15–41)
Albumin: 2.9 g/dL — ABNORMAL LOW (ref 3.5–5.0)
Alkaline Phosphatase: 130 U/L — ABNORMAL HIGH (ref 38–126)
Anion gap: 10 (ref 5–15)
BUN: 9 mg/dL (ref 6–20)
CO2: 23 mmol/L (ref 22–32)
Calcium: 8.7 mg/dL — ABNORMAL LOW (ref 8.9–10.3)
Chloride: 106 mmol/L (ref 98–111)
Creatinine, Ser: 0.7 mg/dL (ref 0.44–1.00)
GFR, Estimated: >60 mL/min (ref >60)
Glucose, Bld: 83 mg/dL (ref 70–99)
Potassium: 3.8 mmol/L (ref 3.5–5.1)
Sodium: 139 mmol/L (ref 135–145)
Total Bilirubin: 1.3 mg/dL — ABNORMAL HIGH (ref 0.3–1.2)
Total Protein: 6.2 g/dL — ABNORMAL LOW (ref 6.5–8.1)

## 2020-12-22 MED ORDER — ACETAMINOPHEN 500 MG PO TABS
1000.0000 mg | ORAL_TABLET | Freq: Once | ORAL | Status: AC
  Administered 2020-12-22: 1000 mg via ORAL
  Filled 2020-12-22: qty 2

## 2020-12-22 NOTE — Lactation Note (Signed)
Lactation Consultation Note  Patient Name: Erica Hall [id number] Date: 12/22/2020 Reason for consult: Initial assessment Age:25 y.o.   LC Initial Visit in MAU:  RN requested assistance with engorgement.  Mother admitted to MAU today with c/o elevated BP at home yesterday.  Mother also concerned with bleeding when she pumps. Her baby is 50 days old and has been breast feeding.  She is receiving formula today while her caregiver is with her due to mother's admission.  Visited with mother in her room in MAU: observed her breasts to be engorged and tender to touch.  RN had initiated the DEBP for mother.  Assisted mother with breast massage during pumping and mother was able to express 32 mls of EBM after her third pumping session since admission.  The first two sessions yielded a total of 32 mls combined.  Nipples and areolas were swollen.  Left nipple larger and more sensitive than the right nipple.  No cracks or bleeding noted during my visit.    Reviewed engorgement prevention/treatment in detail with mother.  She was discharged with a manual pump from the M/B unit after birth.  I asked mother to demonstrate the use of her manual pump and she was able to correctly do so.  Changed her flange size to a #27 from a #24 for greater fit and comfort.  Discussed using EBM/coconut oil/comfort gels consistently and provided the gels with instructions for use.    During my visit I learned that mother was going to be discharged home.  Provided a container of ice and set her bottles of EBM in the ice for transport home.  Reviewed the use of ice packs to the breasts upon arrival home and provided the ice packs for mother.  RN will fill the ice packs immediately prior to discharge.  Plan in place for the continued management of engorgement from discharge until Monday morning when mother will be able to obtain a DEBP from the [facility] office.  Also encouraged a lot of breast massage and hand expression to aid in milk  removal.  At discharge, mother's breasts were beginning to be softer, but, still full.  Mother felt more comfort since pumping in MAU.  She expressed a strong desire to continue the plan we established.  Update provided to NP and she will follow through with the discharge order.  Mother has our OP phone number for any further questions/concerns after discharge.  Attempted to obtain a [facility] loaner for mother this weekend until she could go to the [facility] office on Monday but was unsuccessful in my attempt.  Mother will follow up first thing in the morning on Monday morning to obtain her DEBP.  Father present.  RNs helpful and supportive with the care of this patient.      Maternal Data Formula Feeding for Exclusion: No Has patient been taught Hand Expression?: Yes Does the patient have breastfeeding experience prior to this delivery?: No  Feeding    LATCH Score                   Interventions    Lactation Tools Discussed/Used WIC Program: Yes   Consult Status Consult Status: Complete    Demani Weyrauch R Lylia Karn 12/22/2020, 7:25 PM

## 2020-12-22 NOTE — MAU Provider Note (Signed)
History     CSN: [id number]  Arrival date and time: 12/22/20 1353   Event Date/Time   First Provider Initiated Contact with Patient 12/22/20 1535      Chief Complaint  Patient presents with   Breast Pain   HPI   [name] is a 25 y.o. female G2P1011 status post vaginal delivery on 12/28 here with concerns about bloody discharge from the breast. She intends to breast feeed however today noticed clumpy milk and bloody milk in the bottle after pumping her milk and became afraid to pump and feed her baby breast milk. She has given the baby formula today while in MAU.  The last bottle she pumped was full of blood (patient provided a picture to the staff of bloody milk). She reports pain and fullness in her breast.  She reports one isolated elevated BP at home yesterday;  123456 systolic. She denies HA or changes in vision. She has not taken her BP any further.   OB History    Gravida  2   Para  1   Term  1   Preterm  0   AB  1   Living  1     SAB  0   IAB  0   Ectopic  0   Multiple  0   Live Births  1           Past Medical History:  Diagnosis Date   GERD (gastroesophageal reflux disease)    occasional -diet controlled, no meds   Medical history non-contributory    Molar pregnancy 2019    Past Surgical History:  Procedure Laterality Date   DILATION AND EVACUATION N/A 12/30/2017   Procedure: DILATATION AND EVACUATION;  Surgeon: Sloan Leiter, MD;  Location: [facility];  Service: Gynecology;  Laterality: N/A;    Family History  Problem Relation Age of Onset   Hypertension Father    Hyperlipidemia Father     Social History   Tobacco Use   Smoking status: Never Smoker   Smokeless tobacco: Never Used  Vaping Use   Vaping Use: Never used  Substance Use Topics   Alcohol use: No   Drug use: No    Allergies: No Known Allergies  Medications Prior to Admission  Medication Sig Dispense Refill Last Dose   acetaminophen (TYLENOL)  500 MG tablet Take 2 tablets (1,000 mg total) by mouth every 4 (four) hours as needed (for pain scale < 4).      coconut oil OIL Apply 1 application topically as needed.  0    ibuprofen (ADVIL) 600 MG tablet Take 1 tablet (600 mg total) by mouth every 6 (six) hours. 30 tablet 0    Prenatal Vit-Fe Fumarate-FA (PRENATAL VITAMINS PO) Take by mouth.      Results for orders placed or performed during the hospital encounter of 12/22/20 (from the past 72 hour(s))  CBC with Differential/Platelet     Status: None   Collection Time: 12/22/20  2:58 PM  Result Value Ref Range   WBC 7.7 4.0 - 10.5 K/uL   RBC 3.87 3.87 - 5.11 MIL/uL   Hemoglobin 12.1 12.0 - 15.0 g/dL   HCT 36.9 36.0 - 46.0 %   MCV 95.3 80.0 - 100.0 fL   MCH 31.3 26.0 - 34.0 pg   MCHC 32.8 30.0 - 36.0 g/dL   RDW 13.3 11.5 - 15.5 %   Platelets 283 150 - 400 K/uL   nRBC 0.0 0.0 - 0.2 %  Neutrophils Relative % 69 %   Neutro Abs 5.3 1.7 - 7.7 K/uL   Lymphocytes Relative 24 %   Lymphs Abs 1.9 0.7 - 4.0 K/uL   Monocytes Relative 5 %   Monocytes Absolute 0.4 0.1 - 1.0 K/uL   Eosinophils Relative 1 %   Eosinophils Absolute 0.1 0.0 - 0.5 K/uL   Basophils Relative 0 %   Basophils Absolute 0.0 0.0 - 0.1 K/uL   Immature Granulocytes 1 %   Abs Immature Granulocytes 0.05 0.00 - 0.07 K/uL    Comment: Performed at [facility], [address] [place], [place] [address]  Comprehensive metabolic panel     Status: Abnormal   Collection Time: 12/22/20  2:58 PM  Result Value Ref Range   Sodium 139 135 - 145 mmol/L   Potassium 3.8 3.5 - 5.1 mmol/L   Chloride 106 98 - 111 mmol/L   CO2 23 22 - 32 mmol/L   Glucose, Bld 83 70 - 99 mg/dL    Comment: Glucose reference range applies only to samples taken after fasting for at least 8 hours.   BUN 9 6 - 20 mg/dL   Creatinine, Ser 6.23 0.44 - 1.00 mg/dL   Calcium 8.7 (L) 8.9 - 10.3 mg/dL   Total Protein 6.2 (L) 6.5 - 8.1 g/dL   Albumin 2.9 (L) 3.5 - 5.0 g/dL   AST 28 15 - 41 U/L   ALT 32  0 - 44 U/L   Alkaline Phosphatase 130 (H) 38 - 126 U/L   Total Bilirubin 1.3 (H) 0.3 - 1.2 mg/dL   GFR, Estimated >76 >28 mL/min    Comment: (NOTE) Calculated using the CKD-EPI Creatinine Equation (2021)    Anion gap 10 5 - 15    Comment: Performed at [facility], [address], [place], [place] [address]  Urinalysis, Routine w reflex microscopic Urine, Clean Catch     Status: Abnormal   Collection Time: 12/22/20  3:00 PM  Result Value Ref Range   Color, Urine YELLOW YELLOW   APPearance CLEAR CLEAR   Specific Gravity, Urine 1.017 1.005 - 1.030   pH 6.0 5.0 - 8.0   Glucose, UA NEGATIVE NEGATIVE mg/dL   Hgb urine dipstick MODERATE (A) NEGATIVE   Bilirubin Urine NEGATIVE NEGATIVE   Ketones, ur NEGATIVE NEGATIVE mg/dL   Protein, ur NEGATIVE NEGATIVE mg/dL   Nitrite NEGATIVE NEGATIVE   Leukocytes,Ua MODERATE (A) NEGATIVE   RBC / HPF 11-20 0 - 5 RBC/hpf   WBC, UA 11-20 0 - 5 WBC/hpf   Bacteria, UA NONE SEEN NONE SEEN   Squamous Epithelial / LPF 0-5 0 - 5   Mucus PRESENT     Comment: Performed at [facility], [address], [place], [place] [address]   Review of Systems  Constitutional: Negative for fever.   Physical Exam   Blood pressure 140/82, pulse 92, temperature 98.4 F (36.9 C), temperature source Oral, resp. rate 16, SpO2 100 %, unknown if currently breastfeeding.   Physical Exam Constitutional:      General: She is not in acute distress.    Appearance: Normal appearance. She is not ill-appearing, toxic-appearing or diaphoretic.  Chest:     Chest wall: Tenderness present. No mass, lacerations or deformity.  Breasts:     Right: Tenderness present. No bleeding, inverted nipple or skin change.     Left: Tenderness present. No bleeding, inverted nipple or skin change.      Comments: No erythema noted  surround breast. Dryness noted at bilateral nipples.  Neurological:     Mental Status: She is alert and oriented to person, place, and time.  Psychiatric:         Behavior: Behavior normal.    MAU Course  Procedures  None   MDM  Intake BP 140/82; subsequent BP's were WNL  Lactation called and requested to come see patient. Patient was placed on the electric breat pump in MAU while waiting for lactation.  Patient had much relief from electric breast pump >30 ml's combined.   Assessment and Plan   A:  1. Engorged breasts   2. Postpartum state     P:  Discharge home Unfortunately we were unable to provide patient with an electric breast pump to go home with.  She was instructed to continue hand pump until Monday when she can pick up her pump from [facility] Return to MAU if symptoms worsen Ok to use ibuprofen & Tylenol as needed for breast pain. Continue ICE at home.   Lezlie Lye, NP 12/24/2020 1:43 PM

## 2020-12-22 NOTE — MAU Note (Signed)
Pt reports to mau with c/o elevated bp at home yesterday of 140/88.  Pt states she has not taken bp today.  Pt also reports bleeding from her breasts when she pumps.  Pt reports breasts are very hard and tender to touch.  Denies headache or visual changes.

## 2020-12-22 NOTE — Discharge Instructions (Signed)

## 2021-01-18 ENCOUNTER — Encounter: Payer: Self-pay | Admitting: Family Medicine

## 2021-01-18 ENCOUNTER — Other Ambulatory Visit: Payer: Self-pay

## 2021-01-18 ENCOUNTER — Telehealth (INDEPENDENT_AMBULATORY_CARE_PROVIDER_SITE_OTHER): Payer: Self-pay | Admitting: Family Medicine

## 2021-01-18 DIAGNOSIS — Z8759 Personal history of other complications of pregnancy, childbirth and the puerperium: Secondary | ICD-10-CM

## 2021-01-18 DIAGNOSIS — O24410 Gestational diabetes mellitus in pregnancy, diet controlled: Secondary | ICD-10-CM

## 2021-01-18 DIAGNOSIS — O24430 Gestational diabetes mellitus in the puerperium, diet controlled: Secondary | ICD-10-CM

## 2021-01-18 NOTE — Progress Notes (Signed)
I connected with  Katora Fini on 01/18/21 at  8:35 AM EST by telephone and verified that I am speaking with the correct person using two identifiers.   I discussed the limitations, risks, security and privacy concerns of performing an evaluation and management service by telephone and the availability of in person appointments. I also discussed with the patient that there may be a patient responsible charge related to this service. The patient expressed understanding and agreed to proceed.      I connected with@ on 01/18/21 at  8:35 AM EST by: Mychart video and verified that I am speaking with the correct person using two identifiers.  Patient is located at home and provider is located at [employer] [facility] [employer] [facility] [employer] [facility] .     The purpose of this virtual visit is to provide medical care while limiting exposure to the novel coronavirus. I discussed the limitations, risks, security and privacy concerns of performing an evaluation and management service by telephone (video not working) and the availability of in person appointments. I also discussed with the patient that there may be a patient responsible charge related to this service. By engaging in this virtual visit, you consent to the provision of healthcare.  Additionally, you authorize for your insurance to be billed for the services provided during this visit.  The patient expressed understanding and agreed to proceed.  The following staff members participated in the virtual visit:  Clarnce Flock, MD/MPH  Post Partum Visit Note Subjective:   Erica Hall is a 25 y.o. G73P1011 female being evaluated for postpartum followup.  She is 4 weeks postpartum following a normal spontaneous vaginal delivery at  39 gestational weeks.  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  Postpartum course has been uneventful. Baby is doing well. Baby is  feeding by both breast and bottle - Gental Neopure. Bleeding no bleeding. Bowel function is normal. Bladder function is normal. Patient is not sexually active. Contraception method is condoms. Postpartum depression screening: negative.   The pregnancy intention screening data noted above was reviewed. Potential methods of contraception were discussed. The patient elected to proceed with Female Condom and plans to eventually get an IUD placed at [facility].   The following portions of the patient's history were reviewed and updated as appropriate: allergies, current medications, past family history, past medical history, past social history, past surgical history and problem list.  Review of Systems Pertinent items noted in HPI and remainder of comprehensive ROS otherwise negative.   Objective:  There were no vitals filed for this visit. Self-Obtained       Assessment:    Normal postpartum exam.  Plan:  Essential components of care per ACOG recommendations:  1.  Mood and well being: Patient with negative depression screening today. Reviewed local resources for support.  - Patient does not use tobacco.  - hx of drug use? No    2. Infant care and feeding:  -Patient currently breastmilk feeding? Yes Reviewed importance of draining breast regularly to support lactation. -Social determinants of health (SDOH) reviewed in EPIC. No concerns  3. Sexuality, contraception and birth spacing - Patient does not want a pregnancy in the next year.  Desired family size is undetermined.  - Reviewed forms of contraception in tiered fashion. Patient desired condoms and eventually an IUD today.   - Discussed birth spacing of 18 months  4. Sleep and fatigue -Encouraged family/partner/community  support of 4 hrs of uninterrupted sleep to help with mood and fatigue  5. Physical Recovery  - Discussed patients delivery and complications - Patient had a 1st degree laceration, perineal healing reviewed. Patient  expressed understanding - Patient has urinary incontinence? No - Patient is safe to resume physical and sexual activity  6.  Health Maintenance - Last pap smear done 06/19/2020 and was normal  7. Chronic Disease - GDM: has 2hr GTT scheduled for next week - hx of molar pregnancy: unremarkable pathology postpartum, will check hcg level at upcoming appt for 2hr GTT  10 minutes of non-face-to-face time spent with the patient    Clarnce Flock, MD [facility], MD 01/18/2021  9:40 AM

## 2021-01-21 ENCOUNTER — Other Ambulatory Visit: Payer: Self-pay | Admitting: *Deleted

## 2021-01-21 DIAGNOSIS — Z8632 Personal history of gestational diabetes: Secondary | ICD-10-CM

## 2021-01-23 ENCOUNTER — Other Ambulatory Visit: Payer: Self-pay

## 2021-01-23 DIAGNOSIS — Z8632 Personal history of gestational diabetes: Secondary | ICD-10-CM

## 2021-01-24 LAB — GLUCOSE TOLERANCE, 2 HOURS
Glucose, 2 hour: 108 mg/dL (ref 65–139)
Glucose, GTT - Fasting: 78 mg/dL (ref 65–99)

## 2021-01-30 ENCOUNTER — Emergency Department (HOSPITAL_COMMUNITY)
Admission: EM | Admit: 2021-01-30 | Discharge: 2021-01-30 | Disposition: A | Discharge status: Home / Self Care | Payer: Self-pay | Attending: Emergency Medicine | Admitting: Emergency Medicine

## 2021-01-30 ENCOUNTER — Encounter (HOSPITAL_COMMUNITY): Payer: Self-pay

## 2021-01-30 ENCOUNTER — Ambulatory Visit
Admission: RE | Admit: 2021-01-30 | Discharge: 2021-01-30 | Disposition: A | Discharge status: Home / Self Care | Payer: Self-pay | Source: Ambulatory Visit | Attending: Emergency Medicine | Admitting: Emergency Medicine

## 2021-01-30 ENCOUNTER — Emergency Department (HOSPITAL_COMMUNITY): Payer: Self-pay

## 2021-01-30 ENCOUNTER — Other Ambulatory Visit: Payer: Self-pay

## 2021-01-30 VITALS — BP 111/76 | HR 79 | Temp 98.2°F | Resp 18

## 2021-01-30 DIAGNOSIS — M546 Pain in thoracic spine: Secondary | ICD-10-CM | POA: Insufficient documentation

## 2021-01-30 DIAGNOSIS — M545 Low back pain, unspecified: Secondary | ICD-10-CM | POA: Insufficient documentation

## 2021-01-30 DIAGNOSIS — R1013 Epigastric pain: Secondary | ICD-10-CM | POA: Insufficient documentation

## 2021-01-30 DIAGNOSIS — R1110 Vomiting, unspecified: Secondary | ICD-10-CM | POA: Insufficient documentation

## 2021-01-30 DIAGNOSIS — R079 Chest pain, unspecified: Secondary | ICD-10-CM | POA: Insufficient documentation

## 2021-01-30 DIAGNOSIS — Z5321 Procedure and treatment not carried out due to patient leaving prior to being seen by health care provider: Secondary | ICD-10-CM | POA: Insufficient documentation

## 2021-01-30 DIAGNOSIS — R109 Unspecified abdominal pain: Secondary | ICD-10-CM | POA: Insufficient documentation

## 2021-01-30 LAB — BASIC METABOLIC PANEL
Anion gap: 10 (ref 5–15)
BUN: 14 mg/dL (ref 6–20)
CO2: 25 mmol/L (ref 22–32)
Calcium: 8.7 mg/dL — ABNORMAL LOW (ref 8.9–10.3)
Chloride: 105 mmol/L (ref 98–111)
Creatinine, Ser: 1.06 mg/dL — ABNORMAL HIGH (ref 0.44–1.00)
GFR, Estimated: >60 mL/min (ref >60)
Glucose, Bld: 93 mg/dL (ref 70–99)
Potassium: 3.6 mmol/L (ref 3.5–5.1)
Sodium: 140 mmol/L (ref 135–145)

## 2021-01-30 LAB — POCT URINALYSIS DIP (MANUAL ENTRY)
Glucose, UA: NEGATIVE mg/dL
Ketones, POC UA: NEGATIVE mg/dL
Leukocytes, UA: NEGATIVE
Nitrite, UA: NEGATIVE
Protein Ur, POC: 100 mg/dL — AB
Spec Grav, UA: >=1.03 — AB (ref 1.010–1.025)
Urobilinogen, UA: 0.2 E.U./dL
pH, UA: 6.5 (ref 5.0–8.0)

## 2021-01-30 LAB — CBC
HCT: 42.6 % (ref 36.0–46.0)
Hemoglobin: 13.4 g/dL (ref 12.0–15.0)
MCH: 29.7 pg (ref 26.0–34.0)
MCHC: 31.5 g/dL (ref 30.0–36.0)
MCV: 94.5 fL (ref 80.0–100.0)
Platelets: 272 10*3/uL (ref 150–400)
RBC: 4.51 MIL/uL (ref 3.87–5.11)
RDW: 12.1 % (ref 11.5–15.5)
WBC: 9.2 10*3/uL (ref 4.0–10.5)
nRBC: 0 % (ref 0.0–0.2)

## 2021-01-30 LAB — URINALYSIS, ROUTINE W REFLEX MICROSCOPIC
Bilirubin Urine: NEGATIVE
Glucose, UA: NEGATIVE mg/dL
Ketones, ur: NEGATIVE mg/dL
Leukocytes,Ua: NEGATIVE
Nitrite: NEGATIVE
Protein, ur: NEGATIVE mg/dL
RBC / HPF: >50 RBC/hpf — ABNORMAL HIGH (ref 0–5)
Specific Gravity, Urine: 1.024 (ref 1.005–1.030)
pH: 6 (ref 5.0–8.0)

## 2021-01-30 LAB — I-STAT BETA HCG BLOOD, ED (MC, WL, AP ONLY): I-stat hCG, quantitative: <5 m[IU]/mL (ref <5)

## 2021-01-30 LAB — TROPONIN I (HIGH SENSITIVITY): Troponin I (High Sensitivity): 2 ng/L (ref <18)

## 2021-01-30 MED ORDER — FAMOTIDINE 20 MG PO TABS: 20.0000 mg | ORAL_TABLET | Freq: Two times a day (BID) | ORAL | 0 refills | Status: DC

## 2021-01-30 NOTE — Discharge Instructions (Addendum)
Please use Pepcid twice daily Continue Tylenol 1000 mg every 4-6 hours as needed for pain Drink plenty of water and fluids Please follow-up if not improving or worsening

## 2021-01-30 NOTE — ED Notes (Signed)
Left on own accord

## 2021-01-30 NOTE — ED Provider Notes (Signed)
[name]    CSN: [id number] Arrival date & time: 01/30/21  1105      History   Chief Complaint Chief Complaint  Patient presents with  . Abdominal Pain    Since 0300 this am  . Flank Pain    Since 0300    HPI Erica Hall is a 25 y.o. female 1 month postpartum presenting today for evaluation of abdominal pain and back pain.  Reports that she had chest pain abdominal pain and back pain that woke her up from her sleep last night.  One episode of vomiting.  Went to emergency room earlier this morning but left without being seen after triage.  On chart review, had UA performed showing large hemoglobin and rare bacteria, BMP with slightly elevated creatinine, slightly low calcium, but other electrolytes within normal limits, CBC unremarkable, negative troponins and chest x-ray, negative hCG. Had vaginal delivery without complications. Is breastfeeding. Took tylenol, pain has improved. Mild epigastric pain still, pain into back has improved. Denies further nausea. Bowels normal, LBM yesterday.  Reports lower abdominal cramping and associated bleeding.  Spoke with OB/GYN who reports this can be normal up to 6 weeks after delivery.  HPI  Past Medical History:  Diagnosis Date  . GERD (gastroesophageal reflux disease)    occasional -diet controlled, no meds  . Medical history non-contributory   . Molar pregnancy 2019    Patient Active Problem List   Diagnosis Date Noted  . Vaginal delivery 12/19/2020  . Supervision of high-risk pregnancy 12/17/2020  . Supervision of high risk pregnancy, antepartum 09/10/2020  . 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 Diet controlled gestational diabetes mellitus (GDM) in third trimester 08/21/2020  . Bilateral swelling of feet and ankles 08/03/2020  . Encounter for supervision of high risk pregnancy in second trimester, antepartum 06/19/2020    Past Surgical History:  Procedure Laterality Date  . DILATION AND EVACUATION N/A  12/30/2017   Procedure: DILATATION AND EVACUATION;  Surgeon: Sloan Leiter, MD;  Location: [facility];  Service: Gynecology;  Laterality: N/A;    OB History    Gravida  2   Para  1   Term  1   Preterm  0   AB  1   Living  1     SAB  0   IAB  0   Ectopic  0   Multiple  0   Live Births  1            Home Medications    Prior to Admission medications   Medication Sig Start Date End Date Taking? Authorizing Provider  famotidine (PEPCID) 20 MG tablet Take 1 tablet (20 mg total) by mouth 2 (two) times daily. 01/30/21  Yes Tilia Faso C, PA-C  ibuprofen (ADVIL) 600 MG tablet Take 1 tablet (600 mg total) by mouth every 6 (six) hours. 12/19/20  Yes Arrie Senate, MD  acetaminophen (TYLENOL) 500 MG tablet Take 2 tablets (1,000 mg total) by mouth every 4 (four) hours as needed (for pain scale < 4). Patient not taking: No sig reported 08/05/47   Arrie Senate, MD  coconut oil OIL Apply 1 application topically as needed. Patient not taking: Reported on 01/18/2021 18/56/31   Arrie Senate, MD  Prenatal Vit-Fe Fumarate-FA (PRENATAL VITAMINS PO) Take by mouth.    [provider]    Family History Family History  Problem Relation Age of Onset  . Hypertension Father   . Hyperlipidemia Father  Social History Social History   Tobacco Use  . Smoking status: Never Smoker  . Smokeless tobacco: Never Used  Vaping Use  . Vaping Use: Never used  Substance Use Topics  . Alcohol use: No  . Drug use: No     Allergies   Patient has no known allergies.   Review of Systems Review of Systems  Constitutional: Negative for fever.  Respiratory: Negative for shortness of breath.   Cardiovascular: Positive for chest pain.  Gastrointestinal: Positive for abdominal pain, nausea and vomiting. Negative for diarrhea.  Genitourinary: Negative for dysuria, flank pain, genital sores, hematuria, menstrual problem, vaginal bleeding, vaginal discharge and  vaginal pain.  Musculoskeletal: Positive for back pain.  Skin: Negative for rash.  Neurological: Negative for dizziness, light-headedness and headaches.     Physical Exam Triage Vital Signs ED Triage Vitals  Enc Vitals Group     BP      Pulse      Resp      Temp      Temp src      SpO2      Weight      Height      Head Circumference      Peak Flow      Pain Score      Pain Loc      Pain Edu?      Excl. in [place]?    No data found.  Updated Vital Signs BP 111/76 (BP Location: Left Arm)   Pulse 79   Temp 98.2 F (36.8 C) (Oral)   Resp 18   SpO2 98%   Visual Acuity Right Eye Distance:   Left Eye Distance:   Bilateral Distance:    Right Eye Near:   Left Eye Near:    Bilateral Near:     Physical Exam Vitals and nursing note reviewed.  Constitutional:      Appearance: She is well-developed and well-nourished.     Comments: No acute distress  HENT:     Head: Normocephalic and atraumatic.     Nose: Nose normal.     Mouth/Throat:     Comments: Oral mucosa pink and moist, no tonsillar enlargement or exudate. Posterior pharynx patent and nonerythematous, no uvula deviation or swelling. Normal phonation. Eyes:     Conjunctiva/sclera: Conjunctivae normal.  Cardiovascular:     Rate and Rhythm: Normal rate and regular rhythm.  Pulmonary:     Effort: Pulmonary effort is normal. No respiratory distress.     Comments: Breathing comfortably at rest, CTABL, no wheezing, rales or other adventitious sounds auscultated Abdominal:     General: There is no distension.     Comments: Diffuse tenderness throughout abdomen, more focal to bilateral lower quadrants/suprapubic area and epigastrium, negative rebound, negative Rovsing, negative McBurney's, negative Murphy's  Musculoskeletal:        General: Normal range of motion.     Cervical back: Neck supple.     Comments: No reproducible back tenderness  Skin:    General: Skin is warm and dry.  Neurological:     Mental Status:  She is alert and oriented to person, place, and time.  Psychiatric:        Mood and Affect: Mood and affect normal.      UC Treatments / Results  Labs (all labs ordered are listed, but only abnormal results are displayed) Labs Reviewed  POCT URINALYSIS DIP (MANUAL ENTRY) - Abnormal; Notable for the following components:      Result Value  Color, UA red (*)    Clarity, UA cloudy (*)    Bilirubin, UA small (*)    Spec Grav, UA >=1.030 (*)    Blood, UA large (*)    Protein Ur, POC =100 (*)    All other components within normal limits  URINE CULTURE    EKG   Radiology DG Chest 2 View  Result Date: 01/30/2021 CLINICAL DATA:  25 year old female with chest pain and back pain which woke her from sleep. EXAM: CHEST - 2 VIEW COMPARISON:  None. FINDINGS: Normal lung volumes and mediastinal contours. Visualized tracheal air column is within normal limits. Both lungs appear clear. No pneumothorax or pleural effusion. No osseous abnormality identified.  Negative bowel gas pattern. IMPRESSION: Negative.  No cardiopulmonary abnormality. Electronically Signed   By: Genevie Ann M.D.   On: 01/30/2021 05:32    Procedures Procedures (including critical care time)  Medications Ordered in UC Medications - No data to display  Initial Impression / Assessment and Plan / UC Course  I have reviewed the triage vital signs and the nursing notes.  Pertinent labs & imaging results that were available during my care of the patient were reviewed by me and considered in my medical decision making (see chart for details).     25 year old breast-feeding female-suspicious of abdominal discomfort being related to GERD/gastritis, pain has improved since initial onset, blood work in emergency room reassuring, UA with significant abnormalities, but reports that she is bleeding, will send culture to further rule out UTI.  Continue Tylenol for abdominal cramping.  Push fluids.  Continue to monitor,Discussed strict  return precautions. Patient verbalized understanding and is agreeable with plan.  Final Clinical Impressions(s) / UC Diagnoses   Final diagnoses:  Epigastric pain  Acute bilateral thoracic back pain     Discharge Instructions     Please use Pepcid twice daily Continue Tylenol 1000 mg every 4-6 hours as needed for pain Drink plenty of water and fluids Please follow-up if not improving or worsening    ED Prescriptions    Medication Sig Dispense Auth. Provider   famotidine (PEPCID) 20 MG tablet Take 1 tablet (20 mg total) by mouth 2 (two) times daily. 30 tablet Charlean Carneal, [place] C, PA-C     PDMP not reviewed this encounter.   Janith Lima, PA-C 01/30/21 1220

## 2021-01-30 NOTE — ED Triage Notes (Signed)
Patient states she had a child on the 28th of December 2021 and is now having RUQ and LUQ pain but centrally located. Pt states she has also had some urinary issues. Pt sia ox4 and ambulatory.

## 2021-01-30 NOTE — ED Triage Notes (Signed)
Pt reports that she had CP, abd pain and back pain that woke her from her sleep. Reports vomiting once

## 2021-01-31 LAB — URINE CULTURE

## 2021-02-05 ENCOUNTER — Ambulatory Visit: Payer: Self-pay

## 2021-02-14 ENCOUNTER — Telehealth: Payer: Self-pay | Admitting: Obstetrics and Gynecology

## 2021-02-14 ENCOUNTER — Telehealth: Payer: Self-pay | Admitting: Lactation Services

## 2021-02-14 NOTE — Telephone Encounter (Signed)
Returned patients call. She did not answer. LM for her to call the office if she has continued questions or concerns.   LM for patient that billing questions are referred to the billing office at [phone].

## 2021-02-14 NOTE — Telephone Encounter (Signed)
Returned patients return call. She reports she has called billing and was asked to call the office.   Patient reports she has already called billing and is concerned that she received a $3000.00  bill for date of service on 12/20, reportedly from her office visit. She came to the office, vitals were taken and then she was sent to MAU for evaluation for BP issues. She reports costs are way higher than her usual visit and would like some clarification.   Will forward to nurse manager to call patient to discuss.

## 2021-02-14 NOTE — Telephone Encounter (Signed)
Patient called to say she had received a charge for a visit that she was not seen for. She wants to know what the doctor did, because she went to the hospital. She has requested to speak with a nurse to explain what was done for her at this office visit.

## 2021-02-18 ENCOUNTER — Encounter: Payer: Self-pay | Admitting: Lactation Services

## 2021-03-11 IMAGING — US US MFM OB FOLLOW-UP
1 series · 14 of 28 positions shown · non-contrast
Comparison: none

[Series 1: us mfm ob follow-up · 43 acquisitions, 14 frames shown]
[im 2/43]
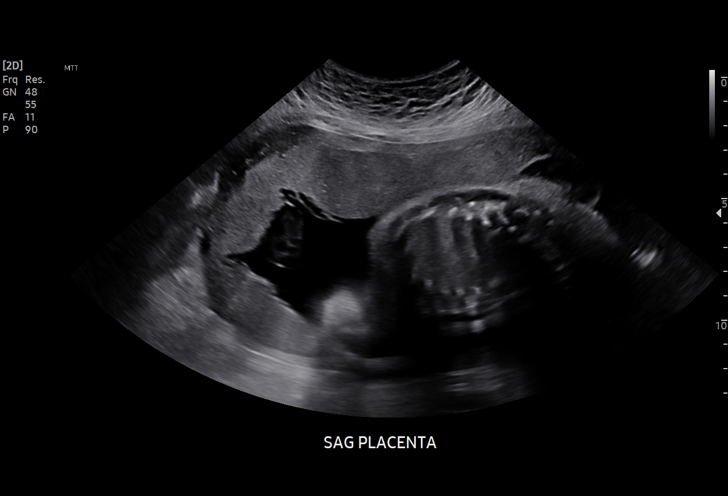
[im 5/43]
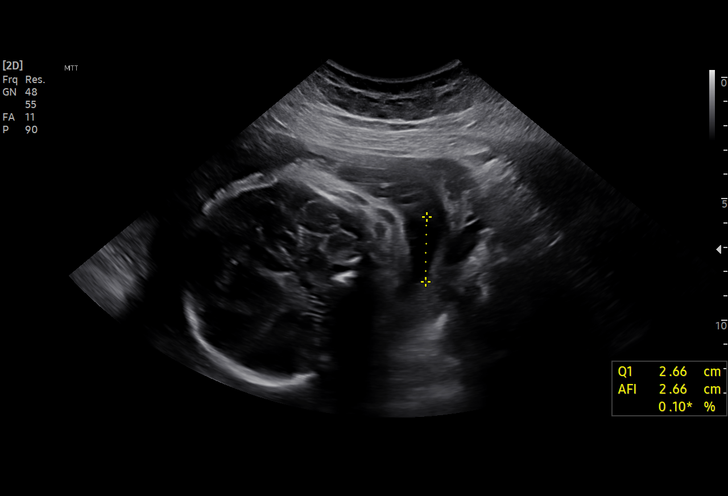
[im 8/43]
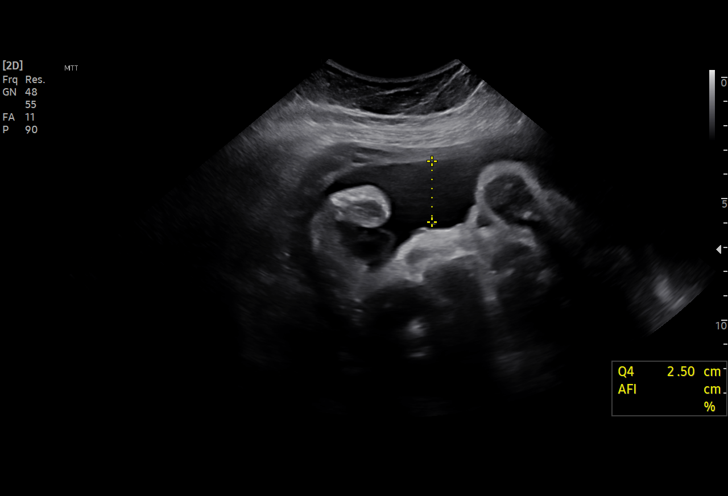
[im 11/43]
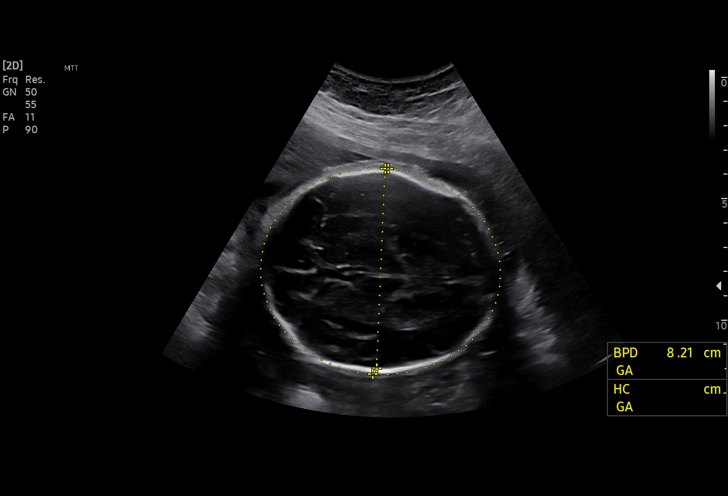
[im 15/43]
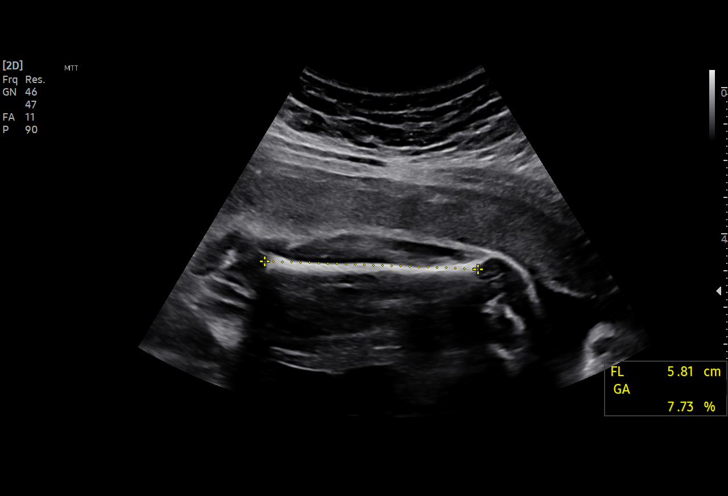
[im 18/43]
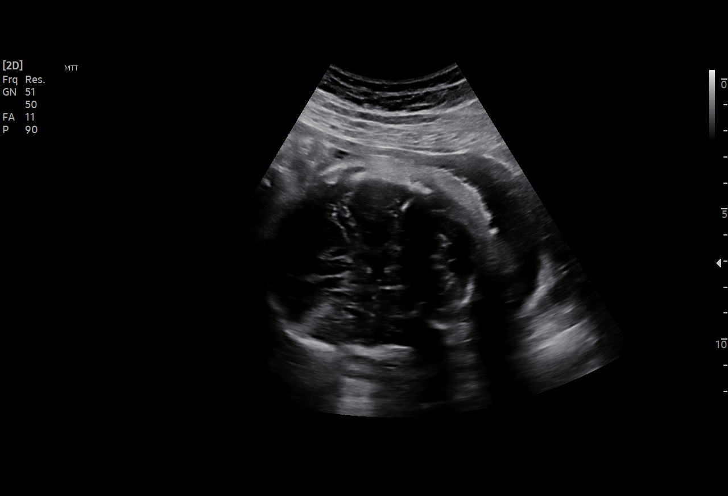
[im 21/43]
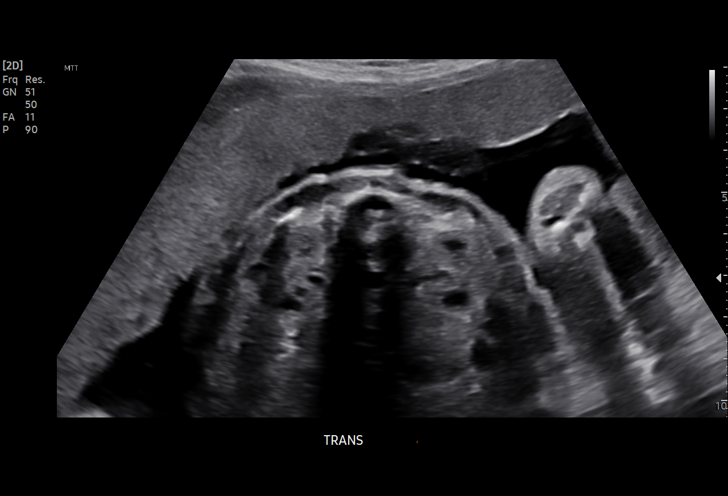
[im 24/43]
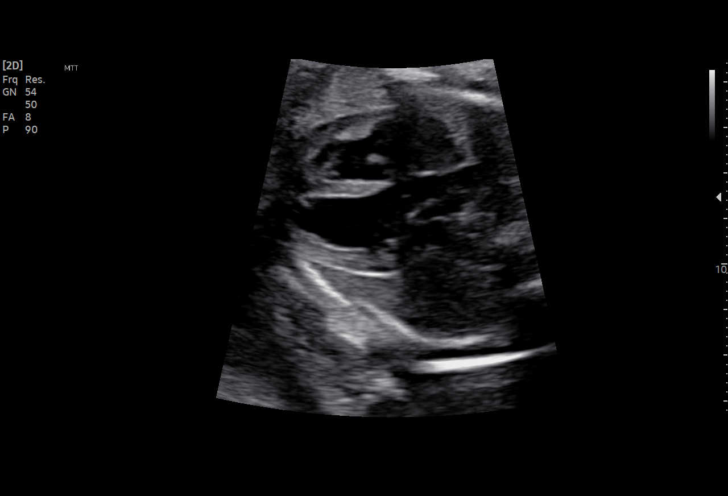
[im 27/43]
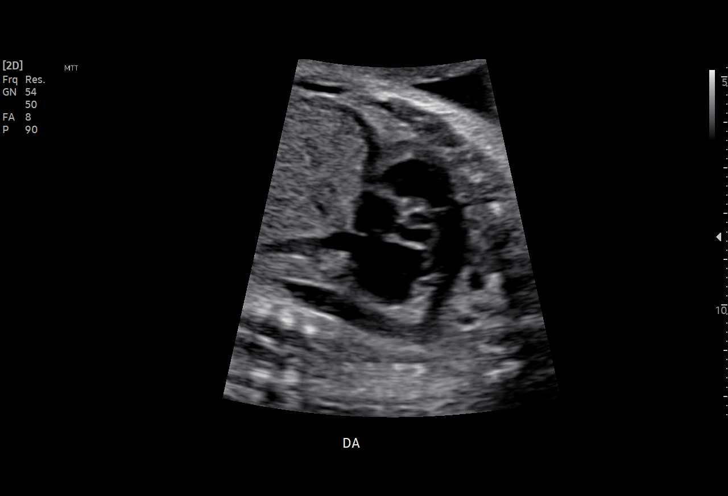
[im 30/43]
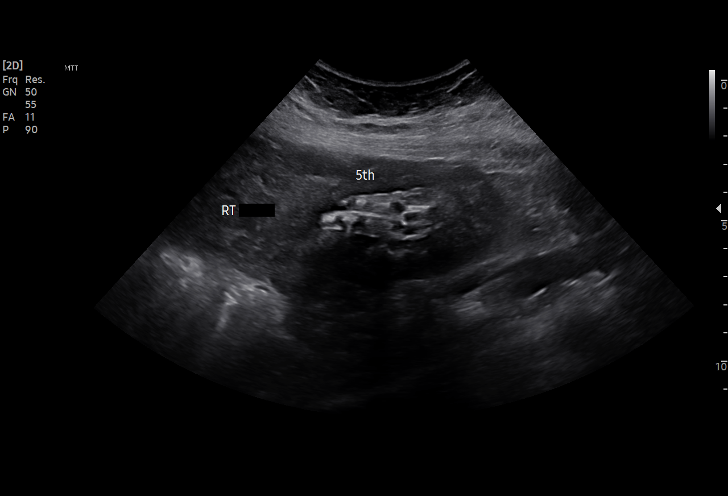
[im 33/43]
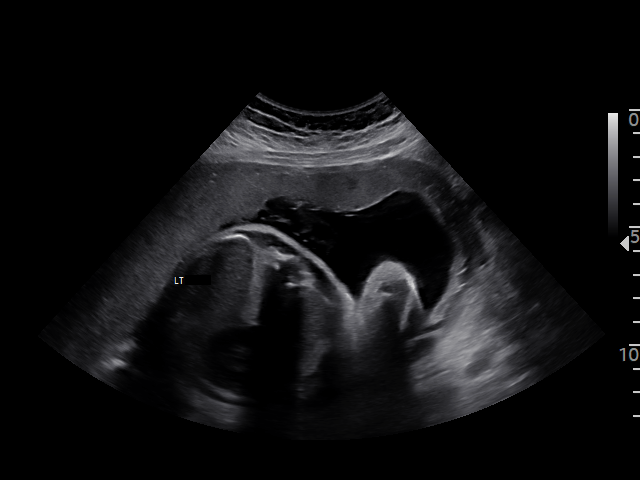
[im 36/43]
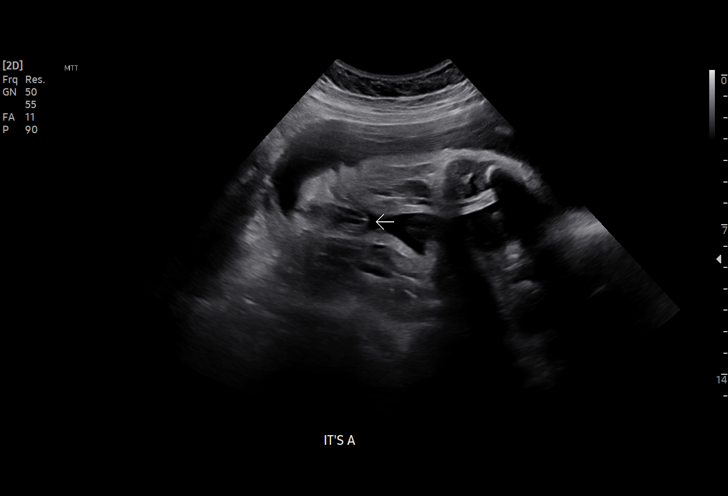
[im 39/43]
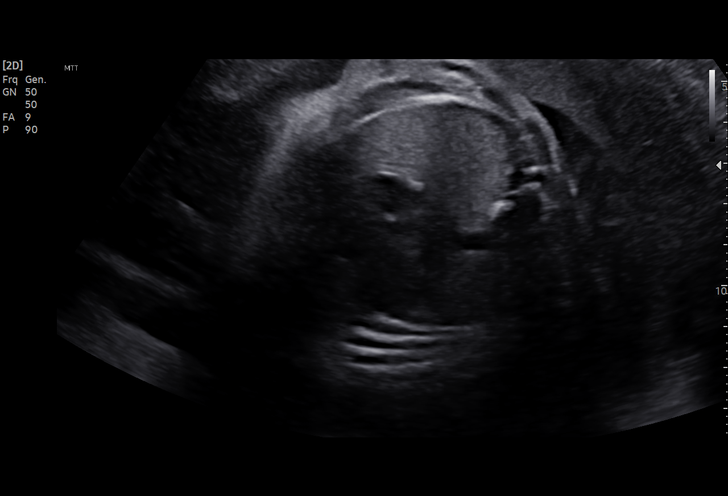
[im 43/43]
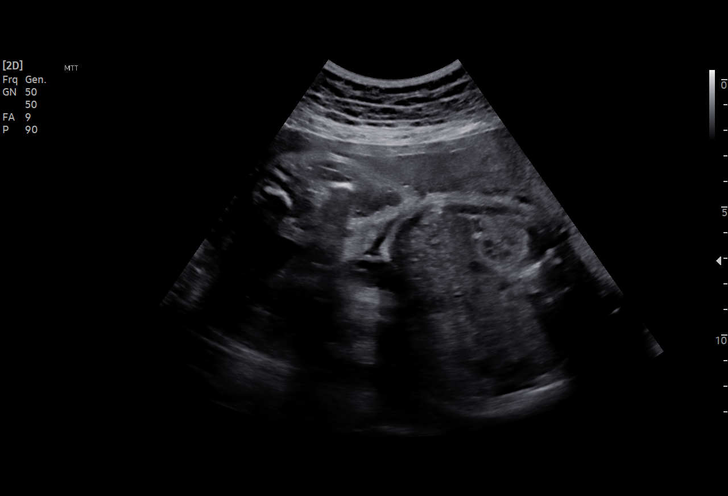

[14 of 28 positions shown; findings below may reference images not displayed]

AGRA

Indications

 Gestational diabetes in pregnancy, diet
 controlled
 Encounter for other antenatal screening
 follow-up
 Poor obstetrical history (molar pregnancy -
 9075)
 31 weeks gestation of pregnancy
Fetal Evaluation

 Num Of Fetuses:         1
 Cardiac Activity:       Observed
 Presentation:           Cephalic
 Placenta:               Anterior Fundal
 P. Cord Insertion:      Visualized, central

 AFI Sum(cm)     %Tile       Largest Pocket(cm)
 17.01           62

 RUQ(cm)       RLQ(cm)       LUQ(cm)        LLQ(cm)

Biometry

 BPD:      81.9  mm     G. Age:  32w 6d         73  %    CI:        79.71   %    70 - 86
                                                         FL/HC:      20.4   %    19.1 -
 HC:      289.9  mm     G. Age:  31w 6d         16  %    HC/AC:      1.05        0.96 -
 AC:      276.5  mm     G. Age:  31w 5d         44  %    FL/BPD:     72.2   %    71 - 87
 FL:       59.1  mm     G. Age:  30w 6d         13  %    FL/AC:      21.4   %    20 - 24
 Est. FW:    1631  gm    3 lb 15 oz      29  %
OB History

 Gravidity:    2         Term:   0        Prem:   0        SAB:   1
 TOP:          0       Ectopic:  0        Living: 0
Gestational Age

 LMP:           31w 6d        Date:  03/14/20                 EDD:   12/19/20
 U/S Today:     31w 6d                                        EDD:   12/19/20
 Best:          31w 6d     Det. By:  LMP  (03/14/20)          EDD:   12/19/20
Anatomy

 Cranium:               Appears normal         LVOT:                   Appears normal
 Cavum:                 Appears normal         Aortic Arch:            Appears normal
 Ventricles:            Appears normal         Ductal Arch:            Appears normal
 Choroid Plexus:        Appears normal         Diaphragm:              Previously seen
 Cerebellum:            Appears normal         Stomach:                Appears normal, left
                                                                       sided
 Posterior Fossa:       Appears normal         Abdomen:                Previously seen
 Nuchal Fold:           Not applicable (>20    Abdominal Wall:         Previously seen
                        wks GA)
 Face:                  Profile previously     Cord Vessels:           Appears normal (3
                        seen                                           vessel cord)
 Lips:                  Previously seen        Kidneys:                Appear normal
 Palate:                Not well visualized    Bladder:                Appears normal
 Thoracic:              Appears normal         Spine:                  Previously seen
 Heart:                 Appears normal         Upper Extremities:      Previously seen
                        (4CH, axis, and
                        situs)
 RVOT:                  Appears normal         Lower Extremities:      Previously seen

 Other:  Hands visualized. Fetus appears to be female.
Cervix Uterus Adnexa

 Cervix
 Not visualized (advanced GA >99wks)

 Right Ovary
 Within normal limits.

 Left Ovary
 Within normal limits.
Comments

 This patient was seen for a follow up growth scan due to diet-
 controlled gestational diabetes.  She reports that her
 fingerstick values have been within normal limits and denies
 any problems since her last exam.
 She was informed that the fetal growth and amniotic fluid
 level appears appropriate for her gestational age.
 A follow up exam was scheduled in 4 weeks.
 She should be referred back for weekly fetal testing should
 she require insulin or Metformin for treatment of gestational
 diabetes.

## 2021-04-09 IMAGING — US US MFM OB FOLLOW-UP
1 series · 14 of 25 positions shown · non-contrast
Comparison: none

[Series 1: us mfm ob follow-up · 25 acquisitions, 14 frames shown]
[im 1/25]
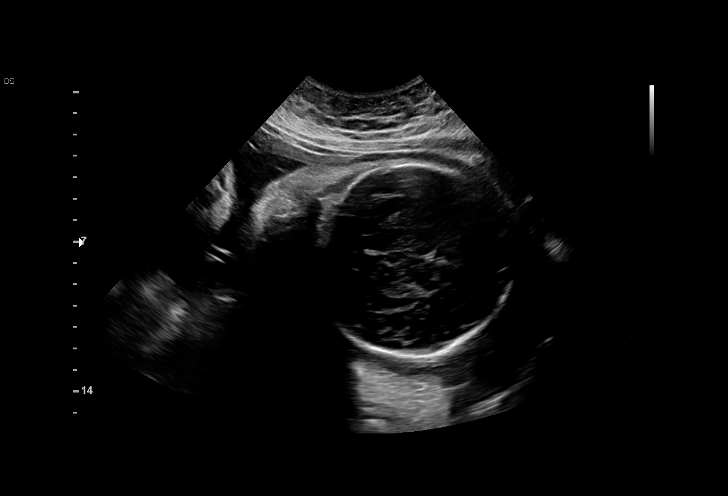
[im 3/25]
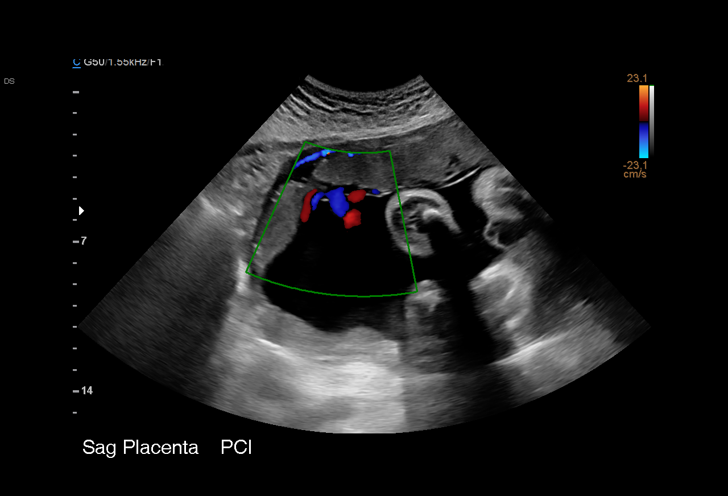
[im 5/25]
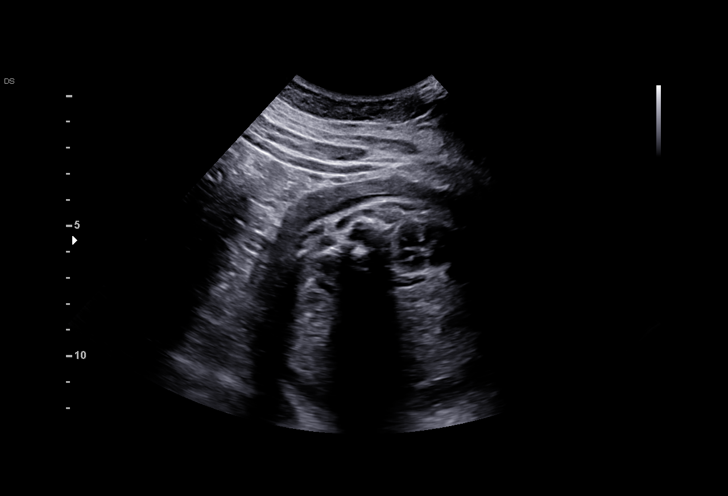
[im 7/25]
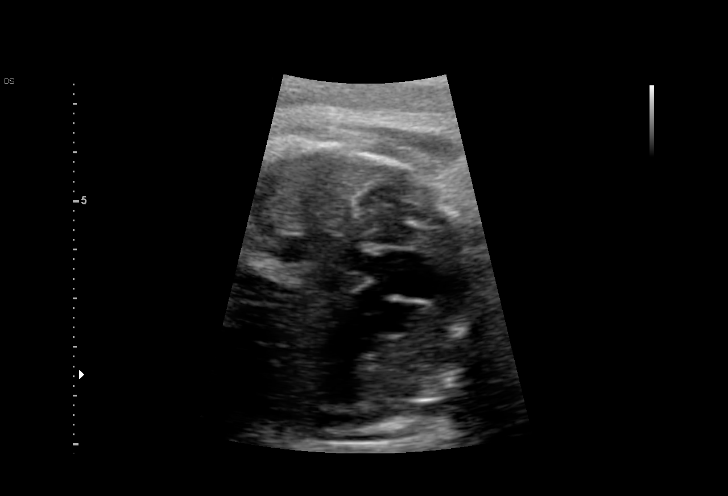
[im 9/25]
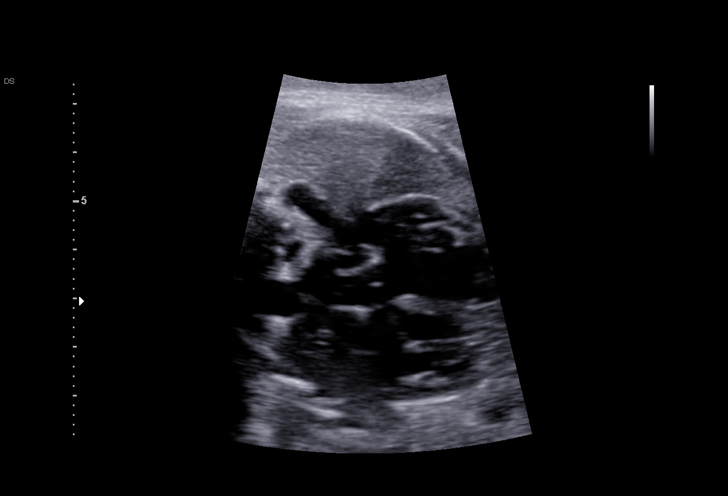
[im 10/25]
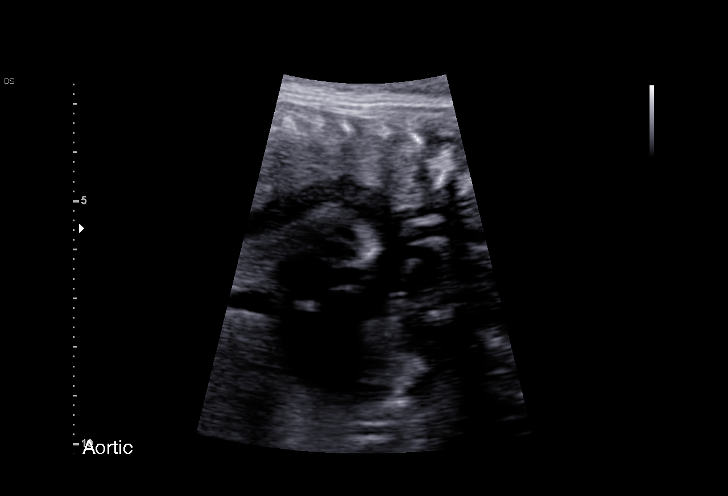
[im 12/25]
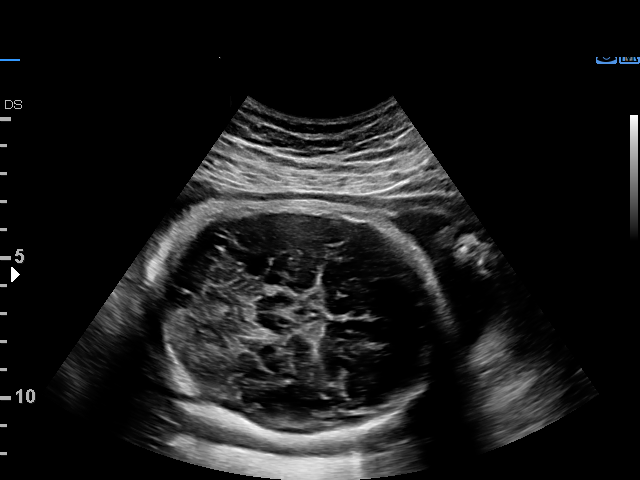
[im 14/25]
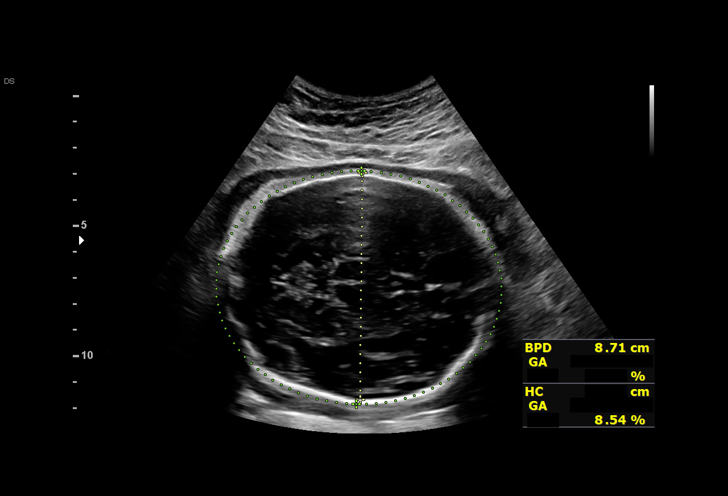
[im 16/25]
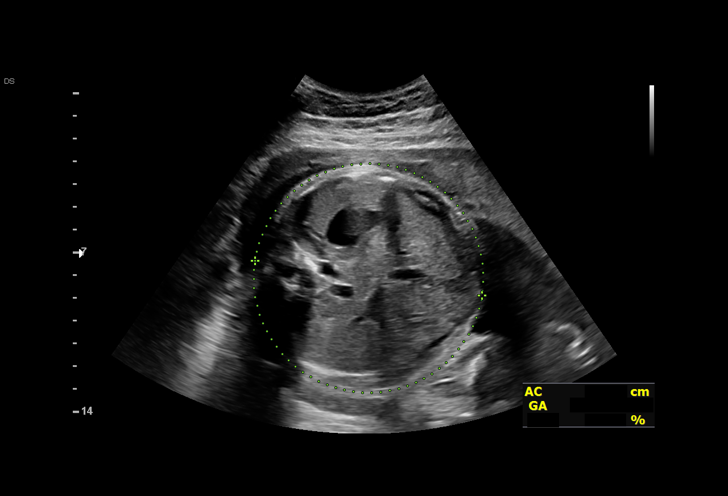
[im 17/25]
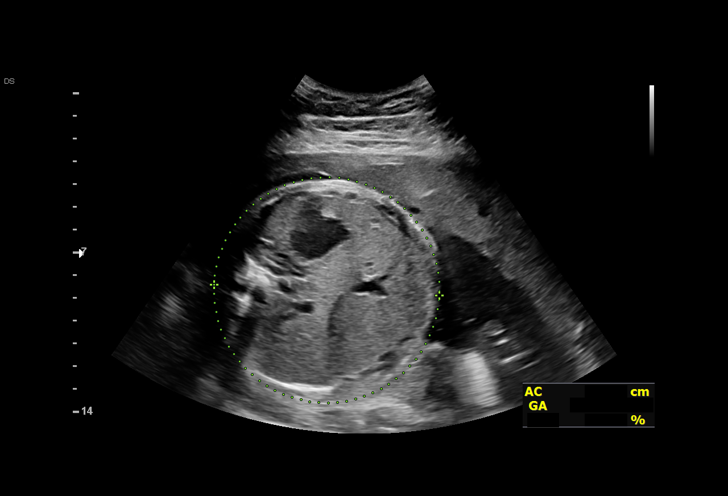
[im 19/25]
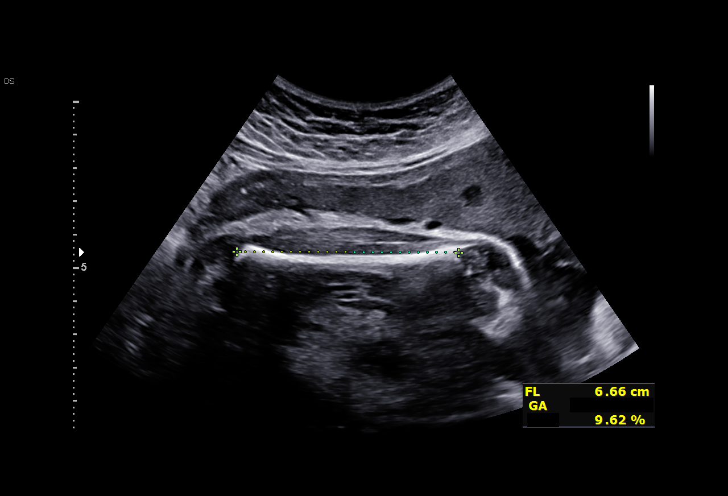
[im 21/25]
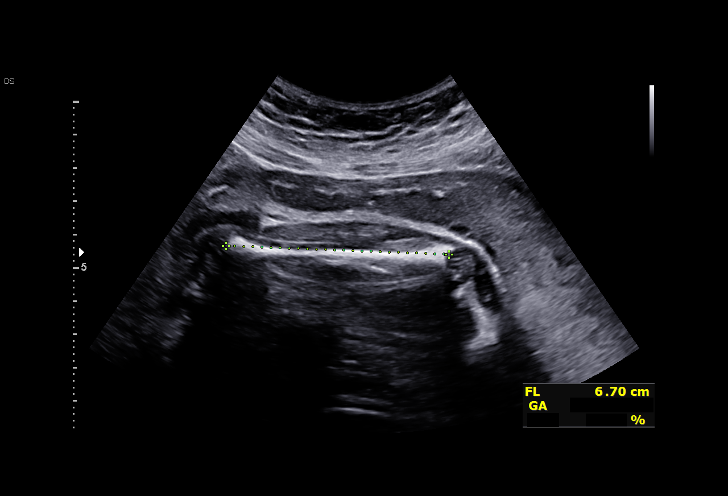
[im 23/25]
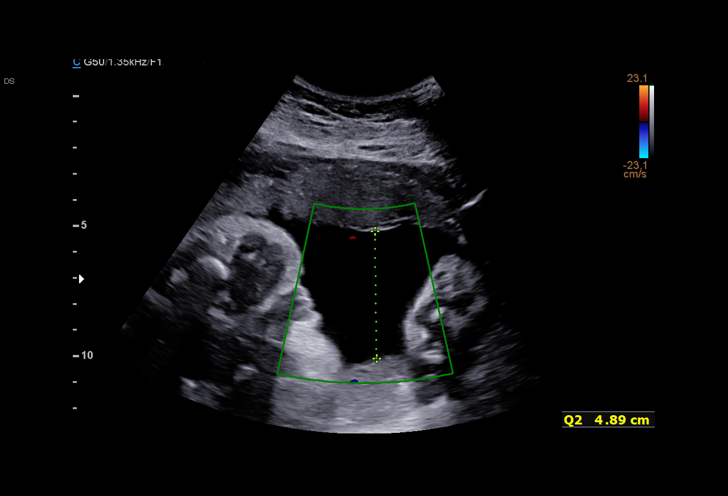
[im 25/25]
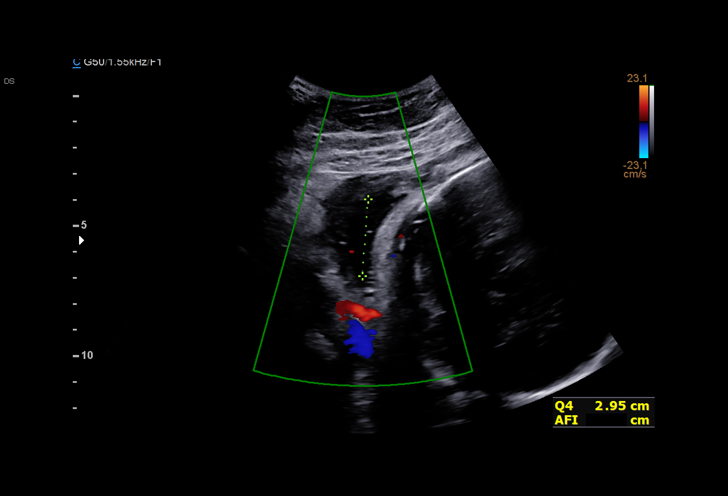

[14 of 25 positions shown; findings below may reference images not displayed]

ELIA PIA

Indications

 Gestational diabetes in pregnancy, diet
 controlled
 Encounter for other antenatal screening
 follow-up
 Poor obstetrical history (molar pregnancy -
 5602)
 36 weeks gestation of pregnancy
Fetal Evaluation

 Num Of Fetuses:         1
 Cardiac Activity:       Observed
 Presentation:           Cephalic
 Placenta:               Anterior Fundal
 P. Cord Insertion:      Visualized, central

 Amniotic Fluid
 AFI FV:      Within normal limits

 AFI Sum(cm)     %Tile       Largest Pocket(cm)
 16.66           62

 RUQ(cm)       RLQ(cm)       LUQ(cm)        LLQ(cm)

Biometry
 BPD:      87.1  mm     G. Age:  35w 1d         34  %    CI:        78.12   %    70 - 86
                                                         FL/HC:      21.3   %    20.1 -
 HC:      311.8  mm     G. Age:  34w 6d          5  %    HC/AC:      0.99        0.93 -
 AC:      313.6  mm     G. Age:  35w 2d         39  %    FL/BPD:     76.1   %    71 - 87
 FL:       66.3  mm     G. Age:  34w 1d          8  %    FL/AC:      21.1   %    20 - 24

 Est. FW:    5229  gm    5 lb 10 oz      23  %
OB History

 Gravidity:    2         Term:   0        Prem:   0        SAB:   1
 TOP:          0       Ectopic:  0        Living: 0
Gestational Age

 LMP:           36w 0d        Date:  03/14/20                 EDD:   12/19/20
 U/S Today:     34w 6d                                        EDD:   12/27/20
 Best:          36w 0d     Det. By:  LMP  (03/14/20)          EDD:   12/19/20
Anatomy

 Cranium:               Appears normal         LVOT:                   Appears normal
 Cavum:                 Appears normal         Aortic Arch:            Appears normal
 Ventricles:            Appears normal         Ductal Arch:            Previously seen
 Choroid Plexus:        Appears normal         Diaphragm:              Previously seen
 Cerebellum:            Appears normal         Stomach:                Appears normal, left
                                                                       sided
 Posterior Fossa:       Appears normal         Abdomen:                Previously seen
 Nuchal Fold:           Not applicable (>20    Abdominal Wall:         Appears nml (cord
                        wks GA)                                        insert, abd wall)
 Face:                  Appears normal         Cord Vessels:           Previously seen
                        (orbits and profile)
 Lips:                  Appears normal         Kidneys:                Appear normal
 Palate:                Not well visualized    Bladder:                Appears normal
 Thoracic:              Appears normal         Spine:                  Previously seen
 Heart:                 Appears normal         Upper Extremities:      Previously seen
                        (4CH, axis, and
                        situs)
 RVOT:                  Appears normal         Lower Extremities:      Previously seen
Impression

 Follow up growth due to OA6VC
 Normal interval growth with measurements consistent with
 dates
 Good fetal movement and amniotic fluid volume

 Ms. Ulloa Diaz reports normal blood glucose logs.
Recommendations

 Follow up growth as clinically indicated.

## 2022-05-27 ENCOUNTER — Encounter: Payer: Self-pay | Admitting: *Deleted

## 2022-09-11 ENCOUNTER — Encounter (HOSPITAL_COMMUNITY): Payer: Self-pay | Admitting: *Deleted

## 2022-09-11 ENCOUNTER — Inpatient Hospital Stay (HOSPITAL_COMMUNITY): Payer: Self-pay

## 2022-09-11 ENCOUNTER — Inpatient Hospital Stay (HOSPITAL_COMMUNITY)
Admission: AD | Admit: 2022-09-11 | Discharge: 2022-09-11 | Disposition: A | Discharge status: Home / Self Care | Payer: Self-pay | Attending: Obstetrics and Gynecology | Admitting: Obstetrics and Gynecology

## 2022-09-11 DIAGNOSIS — N8311 Corpus luteum cyst of right ovary: Secondary | ICD-10-CM | POA: Insufficient documentation

## 2022-09-11 DIAGNOSIS — R1010 Upper abdominal pain, unspecified: Secondary | ICD-10-CM | POA: Insufficient documentation

## 2022-09-11 DIAGNOSIS — M546 Pain in thoracic spine: Secondary | ICD-10-CM | POA: Insufficient documentation

## 2022-09-11 DIAGNOSIS — Z3490 Encounter for supervision of normal pregnancy, unspecified, unspecified trimester: Secondary | ICD-10-CM

## 2022-09-11 DIAGNOSIS — O3481 Maternal care for other abnormalities of pelvic organs, first trimester: Secondary | ICD-10-CM | POA: Insufficient documentation

## 2022-09-11 DIAGNOSIS — O3680X0 Pregnancy with inconclusive fetal viability, not applicable or unspecified: Secondary | ICD-10-CM | POA: Insufficient documentation

## 2022-09-11 DIAGNOSIS — O200 Threatened abortion: Secondary | ICD-10-CM | POA: Insufficient documentation

## 2022-09-11 DIAGNOSIS — O09291 Supervision of pregnancy with other poor reproductive or obstetric history, first trimester: Secondary | ICD-10-CM | POA: Insufficient documentation

## 2022-09-11 DIAGNOSIS — O26891 Other specified pregnancy related conditions, first trimester: Secondary | ICD-10-CM | POA: Insufficient documentation

## 2022-09-11 DIAGNOSIS — O418X10 Other specified disorders of amniotic fluid and membranes, first trimester, not applicable or unspecified: Secondary | ICD-10-CM | POA: Insufficient documentation

## 2022-09-11 DIAGNOSIS — O209 Hemorrhage in early pregnancy, unspecified: Secondary | ICD-10-CM

## 2022-09-11 DIAGNOSIS — Z3A01 Less than 8 weeks gestation of pregnancy: Secondary | ICD-10-CM | POA: Insufficient documentation

## 2022-09-11 HISTORY — DX: Gestational diabetes mellitus in pregnancy, unspecified control: O24.419

## 2022-09-11 HISTORY — DX: Headache, unspecified: R51.9

## 2022-09-11 LAB — CBC
HCT: 39.7 % (ref 36.0–46.0)
Hemoglobin: 13.4 g/dL (ref 12.0–15.0)
MCH: 30.5 pg (ref 26.0–34.0)
MCHC: 33.8 g/dL (ref 30.0–36.0)
MCV: 90.2 fL (ref 80.0–100.0)
Platelets: 268 10*3/uL (ref 150–400)
RBC: 4.4 MIL/uL (ref 3.87–5.11)
RDW: 12.5 % (ref 11.5–15.5)
WBC: 5 10*3/uL (ref 4.0–10.5)
nRBC: 0 % (ref 0.0–0.2)

## 2022-09-11 LAB — URINALYSIS, ROUTINE W REFLEX MICROSCOPIC
Bilirubin Urine: NEGATIVE
Glucose, UA: NEGATIVE mg/dL
Ketones, ur: NEGATIVE mg/dL
Nitrite: NEGATIVE
Protein, ur: NEGATIVE mg/dL
Specific Gravity, Urine: 1.024 (ref 1.005–1.030)
pH: 5 (ref 5.0–8.0)

## 2022-09-11 LAB — WET PREP, GENITAL
Clue Cells Wet Prep HPF POC: NONE SEEN
Sperm: NONE SEEN
Trich, Wet Prep: NONE SEEN
WBC, Wet Prep HPF POC: <10 (ref <10)
Yeast Wet Prep HPF POC: NONE SEEN

## 2022-09-11 LAB — HCG, QUANTITATIVE, PREGNANCY: hCG, Beta Chain, Quant, S: 18538 m[IU]/mL — ABNORMAL HIGH (ref <5)

## 2022-09-11 LAB — POCT PREGNANCY, URINE: Preg Test, Ur: POSITIVE — AB

## 2022-09-11 LAB — HIV ANTIBODY (ROUTINE TESTING W REFLEX): HIV Screen 4th Generation wRfx: NONREACTIVE

## 2022-09-11 NOTE — Discharge Instructions (Signed)
Return to MAU: If you have heavier bleeding that soaks through more that 2 pads per hour for an hour or more If you bleed so much that you feel like you might pass out or you do pass out If you have significant abdominal pain that is not improved with Tylenol 1000 mg every 8 hours as needed for pain If you develop a fever > 100.5  

## 2022-09-11 NOTE — MAU Note (Signed)
Erica Hall is a 26 y.o. at Unknown here in MAU reporting: +HPT on 9/10. Has been cramping.  Some pain in upper abd, thought it was heartburn, having pain around in mid back.  Started having brown spotting on Tues, last night it turned pinkish and today it is red.  LMP: 7/16 Onset of complaint: Tues Pain score: mild, back mod Vitals:   09/11/22 0948  BP: 127/83  Pulse: (!) 106  Resp: 16  Temp: 98.6 F (37 C)  SpO2: 100%      Lab orders placed from triage:  UA, UPT

## 2022-09-11 NOTE — MAU Provider Note (Signed)
History     CSN: [id number]  Arrival date and time: 09/11/22 7564   Event Date/Time   First Provider Initiated Contact with Patient 09/11/22 1021      Chief Complaint  Patient presents with   Vaginal Bleeding   Abdominal Pain   Possible Pregnancy   Erica Hall is a 26 y.o. year old [id number] female at 47w4dweeks gestation by LMP of 07/06/2022 who presents to MAU reporting she started having upper abdominal cramping Tuesday; which made her think it was acid reflux. She describes the pain as being intermittent and mild. She also reports mid-back pain that sometimes makes it uncomfortable to sit for long periods of time. She reports vaginal spotting that started Tuesday 09/09/2022. She describes the spotting as brown on Tuesday, then pink last night and red today. She also describes the blood as having "small pieces of mucous in it." Her history is significant for: molar pregnancy with D&C in 2019, GDM with 2nd pregnancy, SVD with no complications in [id number] currently breastfeeding, and retroverted uterus.    OB History     Gravida  3   Para  1   Term  1   Preterm  0   AB  1   Living  1      SAB  0   IAB  0   Ectopic  0   Multiple  0   Live Births  1        Obstetric Comments  1st preg was a molar preg         Past Medical History:  Diagnosis Date   GERD (gastroesophageal reflux disease)    occasional -diet controlled, no meds   Gestational diabetes    with first preg   Headache    Medical history non-contributory    Molar pregnancy 2019    Past Surgical History:  Procedure Laterality Date   DILATION AND EVACUATION N/A 12/30/2017   Procedure: DILATATION AND EVACUATION;  Surgeon: DSloan Leiter MD;  [facility];  Service: Gynecology;  Laterality: N/A;   WISDOM TOOTH EXTRACTION      Family History  Problem Relation Age of Onset   Diabetes Mother        pre-diabetic   Hypertension Father    Hyperlipidemia Father     Social  History   Tobacco Use   Smoking status: Never   Smokeless tobacco: Never  Vaping Use   Vaping Use: Never used  Substance Use Topics   Alcohol use: No   Drug use: No    Allergies: No Known Allergies  No medications prior to admission.    Review of Systems  Constitutional: Negative.   HENT: Negative.    Eyes: Negative.   Respiratory: Negative.    Cardiovascular: Negative.   Gastrointestinal: Negative.   Endocrine: Negative.   Genitourinary:  Positive for pelvic pain (cramping) and vaginal bleeding.  Musculoskeletal: Negative.   Skin: Negative.   Allergic/Immunologic: Negative.   Neurological: Negative.   Hematological: Negative.   Psychiatric/Behavioral: Negative.     Physical Exam   Blood pressure 127/63, pulse 75, temperature 98.6 F (37 C), temperature source Oral, resp. rate 16, height '5\' 1"'$  (1.549 m), weight 63.1 kg, last menstrual period 07/06/2022, SpO2 100 %, unknown if currently breastfeeding.  Physical Exam Vitals and nursing note reviewed. Exam conducted with a chaperone present.  Constitutional:      Appearance: Normal appearance. She is normal weight.  Cardiovascular:     Rate and  Rhythm: Tachycardia present.  Pulmonary:     Effort: Pulmonary effort is normal.  Abdominal:     General: Abdomen is flat.     Palpations: Abdomen is soft.  Genitourinary:    General: Normal vulva.     Comments: Pelvic exam: External genitalia normal, SE: vaginal walls pink and well rugated, cervix is smooth, pink, no lesions, small amt of brownish-red, mucousy blood in vaginal vault -- WP, GC/CT done, cervix visually closed, Uterus is non-tender, no CMT or friability, mild LT adnexal tenderness.  Musculoskeletal:        General: Normal range of motion.  Skin:    General: Skin is warm and dry.  Neurological:     Mental Status: She is alert and oriented to person, place, and time.  Psychiatric:        Mood and Affect: Mood normal.        Behavior: Behavior normal.         Thought Content: Thought content normal.        Judgment: Judgment normal.    MAU Course  Procedures  MDM CCUA UPT CBC ABO/Rh -- not done, known A POS HCG Wet Prep GC/CT -- pending HIV -- pending OB < 14 wks [place] with TV  Results for orders placed or performed during the hospital encounter of 09/11/22 (from the past 24 hour(s))  Pregnancy, urine POC     Status: Abnormal   Collection Time: 09/11/22  9:29 AM  Result Value Ref Range   Preg Test, Ur POSITIVE (A) NEGATIVE  Urinalysis, Routine w reflex microscopic Urine, Clean Catch     Status: Abnormal   Collection Time: 09/11/22 10:12 AM  Result Value Ref Range   Color, Urine YELLOW YELLOW   APPearance HAZY (A) CLEAR   Specific Gravity, Urine 1.024 1.005 - 1.030   pH 5.0 5.0 - 8.0   Glucose, UA NEGATIVE NEGATIVE mg/dL   Hgb urine dipstick LARGE (A) NEGATIVE   Bilirubin Urine NEGATIVE NEGATIVE   Ketones, ur NEGATIVE NEGATIVE mg/dL   Protein, ur NEGATIVE NEGATIVE mg/dL   Nitrite NEGATIVE NEGATIVE   Leukocytes,Ua TRACE (A) NEGATIVE   RBC / HPF 21-50 0 - 5 RBC/hpf   WBC, UA 6-10 0 - 5 WBC/hpf   Bacteria, UA RARE (A) NONE SEEN   Squamous Epithelial / LPF 6-10 0 - 5   Mucus PRESENT   Wet prep, genital     Status: None   Collection Time: 09/11/22 10:29 AM   Specimen: Vaginal  Result Value Ref Range   Yeast Wet Prep HPF POC NONE SEEN NONE SEEN   Trich, Wet Prep NONE SEEN NONE SEEN   Clue Cells Wet Prep HPF POC NONE SEEN NONE SEEN   WBC, Wet Prep HPF POC <10 <10   Sperm NONE SEEN   CBC     Status: None   Collection Time: 09/11/22 10:38 AM  Result Value Ref Range   WBC 5.0 4.0 - 10.5 K/uL   RBC 4.40 3.87 - 5.11 MIL/uL   Hemoglobin 13.4 12.0 - 15.0 g/dL   HCT 39.7 36.0 - 46.0 %   MCV 90.2 80.0 - 100.0 fL   MCH 30.5 26.0 - 34.0 pg   MCHC 33.8 30.0 - 36.0 g/dL   RDW 12.5 11.5 - 15.5 %   Platelets 268 150 - 400 K/uL   nRBC 0.0 0.0 - 0.2 %  hCG, quantitative, pregnancy     Status: Abnormal   Collection Time: 09/11/22  10:38 AM  Result  Value Ref Range   hCG, Beta Chain, Quant, S 18,538 (H) <5 mIU/mL  HIV Antibody (routine testing w rflx)     Status: None   Collection Time: 09/11/22 10:38 AM  Result Value Ref Range   HIV Screen 4th Generation wRfx Non Reactive Non Reactive    US OB LESS THAN 14 WEEKS WITH OB TRANSVAGINAL  Result Date: 09/11/2022 CLINICAL DATA:  Vaginal bleeding EXAM: OBSTETRIC <14 WK [place] AND TRANSVAGINAL OB US TECHNIQUE: Both transabdominal and transvaginal ultrasound examinations were performed for complete evaluation of the gestation as well as the maternal uterus, adnexal regions, and pelvic cul-de-sac. Transvaginal technique was performed to assess early pregnancy. COMPARISON:  01/05/2018 FINDINGS: Intrauterine gestational sac: Single Yolk sac:  Not Visualized. Embryo:  Visualized. Cardiac Activity: Not Visualized. CRL:  3.6 mm   6 w   0 d                  [place] EDC: 05/07/2023 Subchorionic hemorrhage:  Small subchorionic hemorrhage. Maternal uterus/adnexae: Retroverted uterus. Right ovarian corpus luteal cyst. Unremarkable left ovary. No free fluid within the pelvis. IMPRESSION: 1. Intrauterine gestational sac containing embryo measuring 3.6 mm by crown-rump length. No cardiac activity is evident at this time. Findings are suspicious but not yet definitive for failed pregnancy. Recommend follow-up US in 10-14 days for definitive diagnosis. This recommendation follows SRU consensus guidelines: Diagnostic Criteria for Nonviable Pregnancy Early in the First Trimester. [employer] Med 2013; 093:2355-73. 2. Small subchorionic hemorrhage. Electronically Signed   By: Davina Poke D.O.   On: 09/11/2022 12:46     Assessment and Plan  1. Intrauterine pregnancy  2. Subchorionic hematoma in first trimester, single or unspecified fetus - Information provided on [facility]   3. Threatened miscarriage in early pregnancy - Information provided on threatened miscarriage   4. Vaginal bleeding affecting early  pregnancy - Return to MAU: If you have heavier bleeding that soaks through more that 2 pads per hour for an hour or more If you bleed so much that you feel like you might pass out or you do pass out If you have significant abdominal pain that is not improved with Tylenol 1000 mg every 8 hours as needed for pain If you develop a fever > 100.5   5. Pregnancy with uncertain fetal viability, single or unspecified fetus - US OB LESS THAN 14 WEEKS WITH OB TRANSVAGINAL; Future  6. [redacted] weeks gestation of pregnancy   - Discharge patient - Someone from [facility] will call to schedule rpt U/S - Patient verbalized an understanding of the plan of care and agrees.    Laury Deep, CNM 09/11/2022, 10:30 AM

## 2022-09-12 LAB — GC/CHLAMYDIA PROBE AMP (~~LOC~~) NOT AT ARMC
Chlamydia: NEGATIVE
Comment: NEGATIVE
Comment: NORMAL
Neisseria Gonorrhea: NEGATIVE

## 2022-09-15 ENCOUNTER — Encounter (HOSPITAL_COMMUNITY): Payer: Self-pay | Admitting: Obstetrics & Gynecology

## 2022-09-15 ENCOUNTER — Inpatient Hospital Stay (HOSPITAL_COMMUNITY): Payer: Self-pay

## 2022-09-15 ENCOUNTER — Inpatient Hospital Stay (HOSPITAL_COMMUNITY)
Admission: AD | Admit: 2022-09-15 | Discharge: 2022-09-15 | Disposition: A | Discharge status: Home / Self Care | Payer: Self-pay | Attending: Obstetrics & Gynecology | Admitting: Obstetrics & Gynecology

## 2022-09-15 DIAGNOSIS — O039 Complete or unspecified spontaneous abortion without complication: Secondary | ICD-10-CM | POA: Insufficient documentation

## 2022-09-15 DIAGNOSIS — O0388 Urinary tract infection following complete or unspecified spontaneous abortion: Secondary | ICD-10-CM | POA: Insufficient documentation

## 2022-09-15 DIAGNOSIS — N3000 Acute cystitis without hematuria: Secondary | ICD-10-CM | POA: Insufficient documentation

## 2022-09-15 LAB — URINALYSIS, ROUTINE W REFLEX MICROSCOPIC
Glucose, UA: NEGATIVE mg/dL
Ketones, ur: NEGATIVE mg/dL
Leukocytes,Ua: NEGATIVE
Nitrite: POSITIVE — AB
Protein, ur: 100 mg/dL — AB
Specific Gravity, Urine: 1.025 (ref 1.005–1.030)
pH: 5.5 (ref 5.0–8.0)

## 2022-09-15 LAB — URINALYSIS, MICROSCOPIC (REFLEX)
RBC / HPF: >50 RBC/hpf (ref 0–5)
WBC, UA: NONE SEEN WBC/hpf (ref 0–5)

## 2022-09-15 MED ORDER — SULFAMETHOXAZOLE-TRIMETHOPRIM 800-160 MG PO TABS: 1.0000 | ORAL_TABLET | Freq: Two times a day (BID) | ORAL | 0 refills | Status: DC

## 2022-09-15 NOTE — MAU Note (Signed)
.  Erica Hall is a 26 y.o. at 75w4dhere in MAU reporting: intermittent lower ABD cramping and bright red heavy VB with possible tissue or clots since about 2015. Pt wearing a pad, and changed twice.Pt came to MAU on last Monday for a subchorionic hemorrhage. Pt states she was told to come back for risk of miscarriage if VB increased with pain. Pt denies LOF.  No recent intercourse   Onset of complaint: 2015 Pain score: 8/10 Vitals:   09/15/22 2120  BP: 120/71  Pulse: 76  Resp: 18  Temp: 98.1 F (36.7 C)  SpO2: 100%      Lab orders placed from triage:  ua

## 2022-09-15 NOTE — MAU Provider Note (Signed)
History     [id number]  Arrival date and time: 09/15/22 2107    Chief Complaint  Patient presents with   Vaginal Bleeding     HPI Ekaterini Capitano is a 26 y.o. at 5w4dwho presents for vaginal bleeding. Was seen last week for same & diagnosed with subchorionic hemorrhage.  Reports bleeding increased about an hour prior to arrival. States she filled a pad within 30 minutes and thinks she pass tissue but was afraid to look at it. Also reports intermittent abdominal cramping that she rates 8/10. Denies n/v/d, constipation, recent intercourse.   OB History     Gravida  3   Para  1   Term  1   Preterm  0   AB  1   Living  1      SAB  0   IAB  0   Ectopic  0   Multiple  0   Live Births  1        Obstetric Comments  1st preg was a molar preg         Past Medical History:  Diagnosis Date   GERD (gastroesophageal reflux disease)    occasional -diet controlled, no meds   Gestational diabetes    with first preg   Headache    Molar pregnancy 2019    Past Surgical History:  Procedure Laterality Date   DILATION AND EVACUATION N/A 12/30/2017   Procedure: DILATATION AND EVACUATION;  Surgeon: DSloan Leiter MD;  [facility];  Service: Gynecology;  Laterality: N/A;   WISDOM TOOTH EXTRACTION      Family History  Problem Relation Age of Onset   Diabetes Mother        pre-diabetic   Hypertension Father    Hyperlipidemia Father     No Known Allergies  No current facility-administered medications on file prior to encounter.   Current Outpatient Medications on File Prior to Encounter  Medication Sig Dispense Refill   Prenatal Vit-Fe Fumarate-FA (PRENATAL VITAMINS PO) Take by mouth.     famotidine (PEPCID) 20 MG tablet Take 1 tablet (20 mg total) by mouth 2 (two) times daily. 30 tablet 0     ROS Pertinent positives and negative per HPI, all others reviewed and negative  Physical Exam   BP 120/71 (BP Location: Right Arm)   Pulse 76    Temp 98.1 F (36.7 C)   Resp 18   Ht '5\' 1"'$  (1.549 m)   Wt 63.2 kg   LMP 07/06/2022   SpO2 100%   BMI 26.32 kg/m   Patient Vitals for the past 24 hrs:  BP Temp Pulse Resp SpO2 Height Weight  09/15/22 2120 120/71 98.1 F (36.7 C) 76 18 100 % '5\' 1"'$  (1.549 m) 63.2 kg    Physical Exam Vitals and nursing note reviewed. Exam conducted with a chaperone present.  Constitutional:      General: She is not in acute distress.    Appearance: Normal appearance. She is not ill-appearing.  HENT:     Head: Normocephalic and atraumatic.  Eyes:     General: No scleral icterus.    Conjunctiva/sclera: Conjunctivae normal.  Pulmonary:     Effort: Pulmonary effort is normal. No respiratory distress.  Abdominal:     Palpations: Abdomen is soft.     Tenderness: There is no abdominal tenderness.  Genitourinary:    Comments: Pelvic: 3 cm clot removed from vagina, small amount of mucoid blood at os. Cervix visually closed.  Skin:    General: Skin is warm and dry.  Neurological:     Mental Status: She is alert.      Labs Results for orders placed or performed during the hospital encounter of 09/15/22 (from the past 24 hour(s))  Urinalysis, Routine w reflex microscopic     Status: Abnormal   Collection Time: 09/15/22  9:28 PM  Result Value Ref Range   Color, Urine RED (A) YELLOW   APPearance TURBID (A) CLEAR   Specific Gravity, Urine 1.025 1.005 - 1.030   pH 5.5 5.0 - 8.0   Glucose, UA NEGATIVE NEGATIVE mg/dL   Hgb urine dipstick LARGE (A) NEGATIVE   Bilirubin Urine SMALL (A) NEGATIVE   Ketones, ur NEGATIVE NEGATIVE mg/dL   Protein, ur 100 (A) NEGATIVE mg/dL   Nitrite POSITIVE (A) NEGATIVE   Leukocytes,Ua NEGATIVE NEGATIVE  Urinalysis, Microscopic (reflex)     Status: Abnormal   Collection Time: 09/15/22  9:28 PM  Result Value Ref Range   RBC / HPF >50 0 - 5 RBC/hpf   WBC, UA NONE SEEN 0 - 5 WBC/hpf   Bacteria, UA FEW (A) NONE SEEN   Squamous Epithelial / LPF 0-5 0 - 5     Imaging US OB Transvaginal  Result Date: 09/15/2022 CLINICAL DATA:  Vaginal bleeding Beta HCG pending Gestational age by LMP 6 weeks 4 days EXAM: TRANSVAGINAL OB ULTRASOUND TECHNIQUE: Transvaginal ultrasound was performed for complete evaluation of the gestation as well as the maternal uterus, adnexal regions, and pelvic cul-de-sac. COMPARISON:  Obstetric ultrasound 09/11/2022 FINDINGS: Intrauterine gestational sac: None Yolk sac:  Not Visualized. Embryo:  Not Visualized. Cardiac Activity: Not Visualized. Subchorionic hemorrhage:  None visualized. Maternal uterus/adnexae: Unremarkable ovaries. No free fluid in the pelvis. IMPRESSION: The previous intrauterine gestational sac is no longer visualized. Findings are compatible with early pregnancy loss. Electronically Signed   By: Placido Sou M.D.   On: 09/15/2022 22:38    MAU Course  Procedures Lab Orders         Culture, OB Urine         Urinalysis, Routine w reflex microscopic         Urinalysis, Microscopic (reflex)    Meds ordered this encounter  Medications   sulfamethoxazole-trimethoprim (BACTRIM DS) 800-160 MG tablet    Sig: Take 1 tablet by mouth 2 (two) times daily.    Dispense:  6 tablet    Refill:  0    Order Specific Question:   Supervising Provider    Answer:   Donnamae Jude [[id number]]   Imaging Orders         US OB Transvaginal      MDM Reviewed notes & results from previous MAU visit. Patient had ultrasound that shows IUP with CRL measuring 3.4 mm & no cardiac activity, small subchorionic hemorrhage.  Patient has small amount of bleeding on exam this evening but reports heavy bleeding at home.  Ultrasound tonight shows no IUP, confirmed miscarriage.   She is RH positive  U/a with positive nitrites. No dysuria but does have abdominal cramping (?r/t miscarriage) and noticed increased urinary frequency for the last 2 days. Will rx bactrim.   Assessment and Plan   1. Miscarriage  -RH positive -Pelvic rest -reviewed  bleeding/infection precautions & reasons to return to MAU -Message to [facility] for f/u appointment  2. Acute cystitis without hematuria  -Rx bactrim     Jorje Guild, NP 09/15/22 11:17 PM

## 2022-09-16 LAB — CULTURE, OB URINE
Culture: NO GROWTH
Special Requests: NORMAL

## 2022-09-17 ENCOUNTER — Encounter: Payer: Self-pay | Admitting: Obstetrics and Gynecology

## 2022-09-20 NOTE — Progress Notes (Addendum)
GYNECOLOGY OFFICE VISIT NOTE  History:   Erica Hall is a 26 y.o. [phone] here today for follow up from SAB. She had had a gestational sac and then episode of heavy bleeding. She came to the MAU again and noted to have no gestational sac c/w sab.   Since the miscarriage she notes no issues.   She does have a history of molar pregnancy.     Past Medical History:  Diagnosis Date   GERD (gastroesophageal reflux disease)    occasional -diet controlled, no meds   Gestational diabetes    with first preg   Headache    Molar pregnancy 2019    Past Surgical History:  Procedure Laterality Date   DILATION AND EVACUATION N/A 12/30/2017   Procedure: DILATATION AND EVACUATION;  Surgeon: Sloan Leiter, MD;  Location: [facility];  Service: Gynecology;  Laterality: N/A;   WISDOM TOOTH EXTRACTION      The following portions of the patient's history were reviewed and updated as appropriate: allergies, current medications, past family history, past medical history, past social history, past surgical history and problem list.   Health Maintenance:   Diagnosis  Date Value Ref Range Status  06/19/2020   Final   - Negative for intraepithelial lesion or malignancy (NILM)    Review of Systems:  Pertinent items noted in HPI and remainder of comprehensive ROS otherwise negative.  Physical Exam:  BP 118/79   Pulse 76   Ht '5\' 1"'$  (1.549 m)   Wt 141 lb (64 kg)   LMP 07/06/2022 Comment: SAB  Breastfeeding Unknown   BMI 26.64 kg/m  CONSTITUTIONAL: Well-developed, well-nourished female in no acute distress.  HEENT:  Normocephalic, atraumatic. External right and left ear normal. No scleral icterus.  NECK: Normal range of motion, supple, no masses noted on observation SKIN: No rash noted. Not diaphoretic. No erythema. No pallor. MUSCULOSKELETAL: Normal range of motion. No edema noted. NEUROLOGIC: Alert and oriented to person, place, and time. Normal muscle tone coordination. No cranial  nerve deficit noted. PSYCHIATRIC: Normal mood and affect. Normal behavior. Normal judgment and thought content.  PELVIC: Deferred  Labs and Imaging No results found for this or any previous visit (from the past 168 hour(s)).  US OB Transvaginal  Result Date: 09/15/2022 CLINICAL DATA:  Vaginal bleeding Beta HCG pending Gestational age by LMP 6 weeks 4 days EXAM: TRANSVAGINAL OB ULTRASOUND TECHNIQUE: Transvaginal ultrasound was performed for complete evaluation of the gestation as well as the maternal uterus, adnexal regions, and pelvic cul-de-sac. COMPARISON:  Obstetric ultrasound 09/11/2022 FINDINGS: Intrauterine gestational sac: None Yolk sac:  Not Visualized. Embryo:  Not Visualized. Cardiac Activity: Not Visualized. Subchorionic hemorrhage:  None visualized. Maternal uterus/adnexae: Unremarkable ovaries. No free fluid in the pelvis. IMPRESSION: The previous intrauterine gestational sac is no longer visualized. Findings are compatible with early pregnancy loss. Electronically Signed   By: Placido Sou M.D.   On: 09/15/2022 22:38   US OB LESS THAN 14 WEEKS WITH OB TRANSVAGINAL  Result Date: 09/11/2022 CLINICAL DATA:  Vaginal bleeding EXAM: OBSTETRIC <14 WK [place] AND TRANSVAGINAL OB US TECHNIQUE: Both transabdominal and transvaginal ultrasound examinations were performed for complete evaluation of the gestation as well as the maternal uterus, adnexal regions, and pelvic cul-de-sac. Transvaginal technique was performed to assess early pregnancy. COMPARISON:  01/05/2018 FINDINGS: Intrauterine gestational sac: Single Yolk sac:  Not Visualized. Embryo:  Visualized. Cardiac Activity: Not Visualized. CRL:  3.6 mm   6 w   0 d  [place] EDC: 05/07/2023 Subchorionic hemorrhage:  Small subchorionic hemorrhage. Maternal uterus/adnexae: Retroverted uterus. Right ovarian corpus luteal cyst. Unremarkable left ovary. No free fluid within the pelvis. IMPRESSION: 1. Intrauterine gestational sac containing  embryo measuring 3.6 mm by crown-rump length. No cardiac activity is evident at this time. Findings are suspicious but not yet definitive for failed pregnancy. Recommend follow-up US in 10-14 days for definitive diagnosis. This recommendation follows SRU consensus guidelines: Diagnostic Criteria for Nonviable Pregnancy Early in the First Trimester. [employer] Med 2013; 161:0960-45. 2. Small subchorionic hemorrhage. Electronically Signed   By: Davina Poke D.O.   On: 09/11/2022 12:46    Assessment and Plan:   1. SAB (spontaneous abortion) Would follow HCG to 0 due to history of molar pregnancy in the past.  - Discussed common causes of SAB. Reassured her this was nothing in her control.  - Discussed plans for future child-bearing - pt would like to prevent pregnancy for now. She will consider birth control options - we reviewed options today. She is nursing her daughter.  - Discussed for her history of molar, with each pregnancy would warrant early [place] and possible HCG levels if no confirmed IUP. She can contact us best by Mychart when she is pregnant to arrange this prior to her New OB - Answered all questions      Beta hCG quant (ref lab); Future  Routine preventative health maintenance measures emphasized. Please refer to After Visit Summary for other counseling recommendations.   Return in about 8 months (around 05/23/2023) for annual.  Radene Gunning, MD, [place], [facility] for [facility], [place]

## 2022-09-22 ENCOUNTER — Other Ambulatory Visit: Payer: Self-pay

## 2022-09-30 ENCOUNTER — Other Ambulatory Visit: Payer: Self-pay

## 2022-09-30 ENCOUNTER — Encounter: Payer: Self-pay | Admitting: Obstetrics and Gynecology

## 2022-09-30 ENCOUNTER — Ambulatory Visit (INDEPENDENT_AMBULATORY_CARE_PROVIDER_SITE_OTHER): Payer: Self-pay | Admitting: Obstetrics and Gynecology

## 2022-09-30 VITALS — BP 118/79 | HR 76 | Ht 61.0 in | Wt 141.0 lb

## 2022-09-30 DIAGNOSIS — O039 Complete or unspecified spontaneous abortion without complication: Secondary | ICD-10-CM

## 2022-09-30 NOTE — Progress Notes (Signed)
Pt reports no bleeding nor pain.

## 2022-10-01 ENCOUNTER — Telehealth: Payer: Self-pay

## 2022-10-01 DIAGNOSIS — O039 Complete or unspecified spontaneous abortion without complication: Secondary | ICD-10-CM

## 2022-10-01 LAB — BETA HCG QUANT (REF LAB): hCG Quant: 111 m[IU]/mL

## 2022-10-01 NOTE — Addendum Note (Signed)
Addended by: Georgia Lopes on: 10/01/2022 10:20 AM   Modules accepted: Orders

## 2022-10-01 NOTE — Telephone Encounter (Signed)
Call placed to pt. Spoke with pt. Pt given results and recommendations per Dr Damita Dunnings. Pt verbalized understanding. Pt scheduled for lab on 10/20 at 10am per pt's request.  Colletta [place], [facility]

## 2022-10-01 NOTE — Telephone Encounter (Signed)
-----   Message from Radene Gunning, MD sent at 10/01/2022  9:46 AM EDT ----- Can you arrange for HCG check in one week please? Thanks! pad

## 2022-10-10 ENCOUNTER — Other Ambulatory Visit: Payer: Self-pay

## 2022-10-10 DIAGNOSIS — O039 Complete or unspecified spontaneous abortion without complication: Secondary | ICD-10-CM

## 2022-10-11 LAB — BETA HCG QUANT (REF LAB): hCG Quant: 30 m[IU]/mL

## 2022-10-14 ENCOUNTER — Telehealth: Payer: Self-pay

## 2022-10-14 DIAGNOSIS — O039 Complete or unspecified spontaneous abortion without complication: Secondary | ICD-10-CM

## 2022-10-14 NOTE — Telephone Encounter (Addendum)
-----   Message from Radene Gunning, MD sent at 10/14/2022  7:46 AM EDT ----- She will need a quant HCG this week or early next week.  Thanks, pad  Called pt; appt scheduled for 10/20/22 at 10 AM.

## 2022-10-20 ENCOUNTER — Other Ambulatory Visit: Payer: Self-pay

## 2022-10-20 DIAGNOSIS — O039 Complete or unspecified spontaneous abortion without complication: Secondary | ICD-10-CM

## 2022-10-21 LAB — BETA HCG QUANT (REF LAB): hCG Quant: 5 m[IU]/mL

## 2022-10-26 ENCOUNTER — Encounter: Payer: Self-pay | Admitting: Obstetrics and Gynecology

## 2023-01-02 ENCOUNTER — Ambulatory Visit: Payer: Self-pay

## 2023-03-31 ENCOUNTER — Ambulatory Visit (INDEPENDENT_AMBULATORY_CARE_PROVIDER_SITE_OTHER): Payer: No Typology Code available for payment source | Admitting: Podiatry

## 2023-03-31 DIAGNOSIS — L600 Ingrowing nail: Secondary | ICD-10-CM

## 2023-03-31 DIAGNOSIS — B353 Tinea pedis: Secondary | ICD-10-CM

## 2023-03-31 MED ORDER — KETOCONAZOLE 2 % EX CREA: 1.0000 | TOPICAL_CREAM | Freq: Every day | CUTANEOUS | 2 refills | Status: DC

## 2023-03-31 NOTE — Progress Notes (Signed)
  Subjective:  Patient ID: Erica Hall, female    DOB: [birth year],   MRN: [id number]  Chief Complaint  Patient presents with   Ingrown Toenail    Patient came in today for bilateral ingrown toenails, lateral borders, patient denies any drainage     27 y.o. female presents for concern of bilateral ingrown toenails great and right second ingrown nail. Relates they have been somewhat chronic problems and normally trims them but are painful.  . Denies any other pedal complaints. Denies n/v/f/c.   Past Medical History:  Diagnosis Date   GERD (gastroesophageal reflux disease)    occasional -diet controlled, no meds   Gestational diabetes    with first preg   Headache    Molar pregnancy 2019    Objective:  Physical Exam: Vascular: DP/PT pulses 2/4 bilateral. CFT <3 seconds. Normal hair growth on digits. No edema.  Skin. No lacerations or abrasions bilateral feet. Incurvation of bilateral borders of bilateral great toes and lateral border of right second toe. Scaling and erythema noted to bilateral plantar feet.  Musculoskeletal: MMT 5/5 bilateral lower extremities in DF, PF, Inversion and Eversion. Deceased ROM in DF of ankle joint.  Neurological: Sensation intact to light touch.   Assessment:   1. Ingrown right greater toenail   2. Ingrown left greater toenail   3. Tinea pedis of both feet   4. Ingrown nail of second toe of right foot      Plan:  Patient was evaluated and treated and all questions answered. Discussed ingrown toenails etiology and treatment options including procedure for removal vs conservative care.  Patient would like to come back another time for ingrown procedure as has work later.  Discussed tinea pedis and treatment options. Will send in ketoconazole for her to try.  Return as needed for nail procedure.    Louann Sjogren, DPM

## 2023-12-23 NOTE — L&D Delivery Note (Addendum)
 LABOR COURSE Erica Hall is a 28 y.o. [id number] at [redacted]w[redacted]d presented for SROM 1730 8/28   Delivery Note Called to room and patient was complete and pushing. Head delivered ROA. Two lose nuchal cords were reduced at perineum. Shoulder and body delivered in usual fashion. At  0727 a viable female was delivered via Vaginal, Spontaneous (Presentation: vertex).  Infant with spontaneous cry, placed on mother's abdomen, dried and stimulated. Cord clamped x 2 after 2-minute delay, and cut by FOB. Cord blood drawn. Placenta delivered spontaneously with gentle cord traction. Appears intact. Fundus firm with massage and Pitocin . Labia, perineum, vagina, and cervix inspected with no lacerations.    APGAR: 9 , 9; weight  pending.   Cord: 3VC, thin.    Anesthesia:  none Episiotomy: None Lacerations: none Suture Repair: NA Est. Blood Loss (mL): 70cc  Mom to postpartum.  Baby to Couplet care / Skin to Skin.  Lona Merritts, MD 08/19/24 7:42 AM     The above was performed under my direct supervision and guidance.

## 2023-12-25 ENCOUNTER — Ambulatory Visit: Payer: Self-pay

## 2023-12-25 DIAGNOSIS — Z3201 Encounter for pregnancy test, result positive: Secondary | ICD-10-CM

## 2023-12-25 DIAGNOSIS — O3680X0 Pregnancy with inconclusive fetal viability, not applicable or unspecified: Secondary | ICD-10-CM

## 2023-12-25 DIAGNOSIS — Z8759 Personal history of other complications of pregnancy, childbirth and the puerperium: Secondary | ICD-10-CM

## 2023-12-25 DIAGNOSIS — Z3490 Encounter for supervision of normal pregnancy, unspecified, unspecified trimester: Secondary | ICD-10-CM

## 2023-12-25 LAB — POCT PREGNANCY, URINE: Preg Test, Ur: POSITIVE — AB

## 2023-12-25 MED ORDER — PRENATAL PLUS VITAMIN/MINERAL 27-1 MG PO TABS: 1.0000 | ORAL_TABLET | Freq: Every day | ORAL | 11 refills | Status: AC

## 2023-12-25 NOTE — Progress Notes (Signed)
 Possible Pregnancy  Here today to leave urine for pregnancy confirmation. UPT in office today is positive. Called pt to review. Pt reports first positive home UPT approx 1 week prior. Reviewed dating with patient:   LMP: 11/19/23 EDD: 08/26/23 5w 1d today  OB history reviewed. Patient reports Cleatus, MD recommended early appointment with any future pregnancy. Reviewed note from appt on 09/30/22. Provider recommends early US  to confirm IUP and possible HCG levels if needed due to history of molar pregnancy. US  scheduled for 01/14/24 when patient is approx 8 weeks. Front office to schedule new OB visits with Cleatus MD. Message also sent to [facility] MD with update regarding new pregnancy. Reviewed medications and allergies with patient. Recommended pt begin prenatal vitamin. Reviewed availability of MAU for any abdominal/pelvic pain or vaginal bleeding prior to US .   Vernell FORBES Ruddle, RN 12/25/2023  10:25 AM

## 2024-01-05 NOTE — Progress Notes (Signed)
 Addend:  Wrong year documented for EDD. Notified by front office. Correct dating information below.  LMP: 11/19/23 EDD: 08/25/24 5w 1d today  Marjo Bicker, RN 01/05/2024  2:23 PM

## 2024-01-12 ENCOUNTER — Ambulatory Visit (HOSPITAL_COMMUNITY): Payer: Self-pay

## 2024-01-14 ENCOUNTER — Other Ambulatory Visit: Payer: Self-pay

## 2024-01-14 ENCOUNTER — Ambulatory Visit (HOSPITAL_COMMUNITY)
Admission: RE | Admit: 2024-01-14 | Discharge: 2024-01-14 | Disposition: A | Discharge status: Home / Self Care | Payer: Self-pay | Source: Ambulatory Visit | Attending: Certified Nurse Midwife | Admitting: Certified Nurse Midwife

## 2024-01-14 DIAGNOSIS — Z8759 Personal history of other complications of pregnancy, childbirth and the puerperium: Secondary | ICD-10-CM | POA: Insufficient documentation

## 2024-01-14 DIAGNOSIS — O3680X0 Pregnancy with inconclusive fetal viability, not applicable or unspecified: Secondary | ICD-10-CM | POA: Insufficient documentation

## 2024-01-27 ENCOUNTER — Telehealth (INDEPENDENT_AMBULATORY_CARE_PROVIDER_SITE_OTHER): Payer: Self-pay

## 2024-01-27 DIAGNOSIS — R112 Nausea with vomiting, unspecified: Secondary | ICD-10-CM

## 2024-01-27 DIAGNOSIS — O0990 Supervision of high risk pregnancy, unspecified, unspecified trimester: Secondary | ICD-10-CM | POA: Insufficient documentation

## 2024-01-27 HISTORY — DX: Supervision of high risk pregnancy, unspecified, unspecified trimester: O09.90

## 2024-01-27 MED ORDER — VITAMIN B-6 100 MG PO TABS: 100.0000 mg | ORAL_TABLET | Freq: Every day | ORAL | 3 refills | Status: DC

## 2024-01-27 MED ORDER — UNISOM SLEEPTABS 25 MG PO TABS
25.0000 mg | ORAL_TABLET | Freq: Every evening | ORAL | 3 refills | Status: DC | PRN
Start: 2024-01-27 — End: 2024-03-25

## 2024-01-27 NOTE — Progress Notes (Signed)
 New OB Intake  I connected with Erica Hall  on 01/27/24 at  9:15 AM EST by MyChart Video Visit and verified that I am speaking with the correct person using two identifiers. Nurse is located at [facility] and pt is located at home.  I discussed the limitations, risks, security and privacy concerns of performing an evaluation and management service by telephone and the availability of in person appointments. I also discussed with the patient that there may be a patient responsible charge related to this service. The patient expressed understanding and agreed to proceed.  I explained I am completing New OB Intake today. We discussed EDD of Not found.. Pt is [id number]. I reviewed her allergies, medications and Medical/Surgical/OB history.    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 Discussed the nature of our practice with multiple providers including residents and students. Due to the size of the practice, the delivering provider may not be the same as those providing prenatal care.   Patient is not interested in water birth. Offered upcoming OB visit with CNM to discuss further.  MyChart/Babyscripts MyChart access verified. I explained pt will have some visits in office and some virtually. Babyscripts instructions given and order placed. Patient verifies receipt of registration text/e-mail. Account successfully created and app downloaded. If patient is a candidate for Optimized scheduling, add to sticky note.   Blood Pressure Cuff/Weight Scale Patient is self-pay; explained patient will be given BP cuff at first prenatal appt. Explained after first prenatal appt pt will check weekly and document in Babyscripts. Patient does have weight scale.  Anatomy US  Explained first scheduled US  will be around 19  weeks. Anatomy US  scheduled for 04/01/24 at 0815a.  Is patient a CenteringPregnancy candidate?  Will ask @ New OB visit   If accepted,    Is patient a Mom+Baby Combined Care candidate?  Not a candidate   If accepted, confirm patient does not intend to move from the area for at least 12 months, then notify Mom+Baby staff  Interested in [place]? If yes, send referral and doula dot phrase.   Is patient a candidate for Babyscripts Optimization?    First visit review I reviewed new OB appt with patient. Explained pt will be seen by Dr. Cleatus at first visit. Discussed [place] genetic screening with patient. needs Panorama and Horizon.. Routine prenatal labs  needed at [facility] OB visit.    Last Pap Diagnosis  Date Value Ref Range Status  06/19/2020   Final   - Negative for intraepithelial lesion or malignancy (NILM)    Erica Hall Mulch, CMA 01/27/2024  10:21 AM

## 2024-01-27 NOTE — Progress Notes (Signed)
Pt reports having a lot of Nausea, sent Rx for Unisom, 1/2 tab in am & mid-afternoon, 1 whole tab @ bedtime.Vitamin B6 100 mg BID. Pt verbalized understanding.

## 2024-02-09 ENCOUNTER — Encounter: Payer: Self-pay | Admitting: Obstetrics and Gynecology

## 2024-02-09 ENCOUNTER — Ambulatory Visit: Payer: Self-pay | Admitting: Obstetrics and Gynecology

## 2024-02-10 ENCOUNTER — Encounter: Payer: Self-pay | Admitting: Obstetrics and Gynecology

## 2024-02-23 ENCOUNTER — Encounter: Payer: Self-pay | Admitting: Obstetrics and Gynecology

## 2024-02-23 ENCOUNTER — Ambulatory Visit (INDEPENDENT_AMBULATORY_CARE_PROVIDER_SITE_OTHER): Payer: Self-pay | Admitting: Obstetrics and Gynecology

## 2024-02-23 ENCOUNTER — Other Ambulatory Visit (HOSPITAL_COMMUNITY)
Admission: RE | Admit: 2024-02-23 | Discharge: 2024-02-23 | Disposition: A | Discharge status: Home / Self Care | Payer: Self-pay | Source: Ambulatory Visit | Attending: Obstetrics and Gynecology | Admitting: Obstetrics and Gynecology

## 2024-02-23 VITALS — BP 121/77 | HR 94 | Wt 171.0 lb

## 2024-02-23 DIAGNOSIS — O24419 Gestational diabetes mellitus in pregnancy, unspecified control: Secondary | ICD-10-CM | POA: Insufficient documentation

## 2024-02-23 DIAGNOSIS — Z8759 Personal history of other complications of pregnancy, childbirth and the puerperium: Secondary | ICD-10-CM

## 2024-02-23 DIAGNOSIS — Z8632 Personal history of gestational diabetes: Secondary | ICD-10-CM

## 2024-02-23 DIAGNOSIS — Z3A13 13 weeks gestation of pregnancy: Secondary | ICD-10-CM

## 2024-02-23 DIAGNOSIS — O0990 Supervision of high risk pregnancy, unspecified, unspecified trimester: Secondary | ICD-10-CM

## 2024-02-23 NOTE — Progress Notes (Signed)
 History:   Erica Hall is a 28 y.o. [phone] at [redacted]w[redacted]d by LMP being seen today for her first obstetrical visit.   Patient does intend to breast feed.   Pregnancy history fully reviewed. Obstetrical history is significant for FT SVD c/b GDM diet controlled.   Patient reports no complaints.      HISTORY: OB History  Gravida Para Term Preterm AB Living  4 1 1  0 2 1  SAB IAB Ectopic Multiple Live Births  1 0 0 0 1    # Outcome Date GA Lbr Len/2nd Weight Sex Type Anes PTL Lv  4 Current           3 SAB 08/2022          2 Term 12/18/20 [redacted]w[redacted]d 06:56 / 00:31 6 lb (2.722 kg) F Vag-Spont EPI  LIV     Complications: Gestational diabetes     Name: Erica Hall     Apgar1: 8  Apgar5: 9  1 Molar 12/30/17 [redacted]w[redacted]d           Obstetric Comments  1st preg was a molar preg     Lab Results  Component Value Date   DIAGPAP  06/19/2020    - Negative for intraepithelial lesion or malignancy (NILM)     Past Medical History:  Diagnosis Date   GERD (gastroesophageal reflux disease)    occasional -diet controlled, no meds   Gestational diabetes    with first preg   Headache    Molar pregnancy 2019   Past Surgical History:  Procedure Laterality Date   DILATION AND EVACUATION N/A 12/30/2017   Procedure: DILATATION AND EVACUATION;  Surgeon: Conan Bowens, MD;  Location: [facility];  Service: Gynecology;  Laterality: N/A;   WISDOM TOOTH EXTRACTION     Family History  Problem Relation Age of Onset   Diabetes Mother        pre-diabetic   Hypertension Father    Hyperlipidemia Father    Social History   Tobacco Use   Smoking status: Never   Smokeless tobacco: Never  Vaping Use   Vaping status: Never Used  Substance Use Topics   Alcohol use: No   Drug use: No   No Known Allergies Current Outpatient Medications on File Prior to Visit  Medication Sig Dispense Refill   doxylamine, Sleep, (UNISOM SLEEPTABS) 25 MG tablet Take 1 tablet (25 mg total) by mouth at bedtime  as needed. 30 tablet 3   Prenatal Vit-Fe Fumarate-FA (PRENATAL PLUS VITAMIN/MINERAL) 27-1 MG TABS Take 1 tablet by mouth daily. 30 tablet 11   pyridOXINE (VITAMIN B6) 100 MG tablet Take 1 tablet (100 mg total) by mouth daily. 30 tablet 3   No current facility-administered medications on file prior to visit.    Review of Systems Pertinent items noted in HPI and remainder of comprehensive ROS otherwise negative.  Physical Exam:   Vitals:   02/23/24 1432  BP: 121/77  Pulse: 94  Weight: 171 lb (77.6 kg)   Fetal Heart Rate (bpm): 154  General: well-developed, well-nourished female in no acute distress  Skin: normal coloration and turgor, no rashes  Neurologic: oriented, normal, negative, normal mood  Extremities: normal strength, tone, and muscle mass, ROM of all joints is normal  HEENT PERRLA, extraocular movement intact and sclera clear, anicteric  Neck supple and no masses  Cardiovascular: regular rate and rhythm  Respiratory:  no respiratory distress, normal breath sounds  Abdomen: soft, non-tender; bowel sounds normal; no masses,  no organomegaly    Assessment:    Pregnancy: [id number] Patient Active Problem List   Diagnosis Date Noted   History of gestational diabetes 02/23/2024   Supervision of high risk pregnancy, antepartum 01/27/2024   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 [redacted] weeks gestation of pregnancy (Primary)  2. History of molar pregnancy PP HCG and placenta to path  3. Supervision of high risk pregnancy, antepartum Initial labs ordered. Continue prenatal vitamins. Problem list reviewed and updated. Genetic Screening discussed, she will consider.  Ultrasound discussed; fetal anatomic survey: ordered. Anticipatory guidance about prenatal visits given including labs, ultrasounds, and testing. Discussed usage of Babyscripts and virtual visits  4. History of gestational diabetes Early 2 hr to be done     The nature of [place] [facility]  [facility] Healthcare/Faculty Practice with multiple MDs and Advanced Practice Providers was explained to patient; also emphasized that residents, students are part of our team. Routine obstetric precautions reviewed. Encouraged to seek out care at office or emergency room [facility] MAU preferred) for urgent and/or emergent concerns. Return in about 4 weeks (around 03/22/2024) for OB VISIT, MD or APP.    Milas Hock, MD, FACOG Obstetrician & Gynecologist, [facility], [facility]

## 2024-02-24 LAB — CBC/D/PLT+RPR+RH+ABO+RUBIGG...
Antibody Screen: NEGATIVE
Basophils Absolute: 0 10*3/uL (ref 0.0–0.2)
Basos: 0 %
EOS (ABSOLUTE): 0 10*3/uL (ref 0.0–0.4)
Eos: 0 %
HCV Ab: NONREACTIVE
HIV Screen 4th Generation wRfx: NONREACTIVE
Hematocrit: 38 % (ref 34.0–46.6)
Hemoglobin: 12.7 g/dL (ref 11.1–15.9)
Hepatitis B Surface Ag: NEGATIVE
Immature Grans (Abs): 0 10*3/uL (ref 0.0–0.1)
Immature Granulocytes: 0 %
Lymphocytes Absolute: 1.6 10*3/uL (ref 0.7–3.1)
Lymphs: 16 %
MCH: 30.3 pg (ref 26.6–33.0)
MCHC: 33.4 g/dL (ref 31.5–35.7)
MCV: 91 fL (ref 79–97)
Monocytes Absolute: 0.4 10*3/uL (ref 0.1–0.9)
Monocytes: 4 %
Neutrophils Absolute: 8 10*3/uL — ABNORMAL HIGH (ref 1.4–7.0)
Neutrophils: 80 %
Platelets: 279 10*3/uL (ref 150–450)
RBC: 4.19 x10E6/uL (ref 3.77–5.28)
RDW: 12.8 % (ref 11.7–15.4)
RPR Ser Ql: NONREACTIVE
Rh Factor: POSITIVE
Rubella Antibodies, IGG: 1.35 {index} (ref >0.99)
WBC: 10.1 10*3/uL (ref 3.4–10.8)

## 2024-02-24 LAB — HCV INTERPRETATION

## 2024-02-24 LAB — HEMOGLOBIN A1C
Est. average glucose Bld gHb Est-mCnc: 114 mg/dL
Hgb A1c MFr Bld: 5.6 % (ref 4.8–5.6)

## 2024-02-25 LAB — CULTURE, OB URINE

## 2024-02-25 LAB — GC/CHLAMYDIA PROBE AMP (~~LOC~~) NOT AT ARMC
Chlamydia: NEGATIVE
Comment: NEGATIVE
Comment: NORMAL
Neisseria Gonorrhea: NEGATIVE

## 2024-02-25 LAB — URINE CULTURE, OB REFLEX: Organism ID, Bacteria: NO GROWTH

## 2024-02-29 LAB — PANORAMA PRENATAL TEST FULL PANEL:PANORAMA TEST PLUS 5 ADDITIONAL MICRODELETIONS: FETAL FRACTION: 11.9

## 2024-03-02 ENCOUNTER — Other Ambulatory Visit: Payer: Self-pay

## 2024-03-02 ENCOUNTER — Encounter: Payer: Self-pay | Admitting: Obstetrics and Gynecology

## 2024-03-02 DIAGNOSIS — Z124 Encounter for screening for malignant neoplasm of cervix: Secondary | ICD-10-CM

## 2024-03-02 NOTE — Progress Notes (Signed)
 Patient: Erica Hall           Date of Birth: May 14, 1996           MRN: [id number] Visit Date: 03/02/2024 PCP: Pcp, No  Cervical Cancer Screening Do you smoke?: No Have you ever had or been told you have an allergy to latex products?: No Marital status: Single Date of last pap smear: 2-5 yrs ago Date of last menstrual period: 11/19/23 Number of pregnancies: 4 Number of births: 1 Have you ever had any of the following? Hysterectomy: No Tubal ligation (tubes tied): No Abnormal bleeding: No Abnormal pap smear: No Venereal warts: No A sex partner with venereal warts: No A high risk* sex partner: No  Cervical Exam  Abnormal Observations: Patient currently 14 weeks and 6 days pregnant. Cervix friable.  Recommendations: Last Pap smear was 06/19/2020 at [facility] and normal. Per patient has no history of an abnormal Pap smear. Last Pap smear result is available in EPIC. Let patient know if today's Pap smear is normal that her next Pap smear is due in 3 years. Informed patient that will follow up with her within the next couple of weeks with result of Pap smear by letter or phone. Patient verbalized understanding.      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   Past Medical History:  Diagnosis Date   GERD (gastroesophageal reflux disease)    occasional -diet controlled, no meds   Gestational diabetes    with first preg   Headache    Molar pregnancy 2019    Family History  Problem Relation Age of Onset   Diabetes Mother        pre-diabetic   Hypertension Father    Hyperlipidemia Father     Social History   Occupational History   Not on file  Tobacco Use   Smoking status: Never   Smokeless tobacco: Never  Vaping Use   Vaping status: Never Used  Substance and Sexual  Activity   Alcohol use: No   Drug use: No   Sexual activity: Yes    Birth control/protection: None    Comment: approx [redacted] wks gestation - 1st pregnancy

## 2024-03-02 NOTE — Patient Instructions (Signed)
 Marland Kitchen

## 2024-03-03 LAB — CYTOLOGY - PAP: Diagnosis: NEGATIVE

## 2024-03-04 LAB — HORIZON CUSTOM: REPORT SUMMARY: NEGATIVE

## 2024-03-08 ENCOUNTER — Other Ambulatory Visit: Payer: Self-pay

## 2024-03-08 DIAGNOSIS — Z8632 Personal history of gestational diabetes: Secondary | ICD-10-CM

## 2024-03-11 ENCOUNTER — Other Ambulatory Visit: Payer: Self-pay

## 2024-03-11 DIAGNOSIS — Z8632 Personal history of gestational diabetes: Secondary | ICD-10-CM

## 2024-03-12 ENCOUNTER — Encounter: Payer: Self-pay | Admitting: Obstetrics and Gynecology

## 2024-03-12 LAB — GLUCOSE TOLERANCE, 2 HOURS W/ 1HR
Glucose, 1 hour: 183 mg/dL — ABNORMAL HIGH (ref 70–179)
Glucose, 2 hour: 129 mg/dL (ref 70–152)
Glucose, Fasting: 86 mg/dL (ref 70–91)

## 2024-03-14 ENCOUNTER — Telehealth: Payer: Self-pay

## 2024-03-14 DIAGNOSIS — O24410 Gestational diabetes mellitus in pregnancy, diet controlled: Secondary | ICD-10-CM

## 2024-03-14 NOTE — Telephone Encounter (Addendum)
-----   Message from Milas Hock sent at 03/12/2024  1:52 PM EDT ----- Please get her scheduled with DM educator and send in supplies.  Thanks, pad   Called patient; reviewed results and recommendation. Scheduled for diabetes education on Thursday, 03/17/24. Referral ordered. Patient is self-pay, will be given testing kit at appt.

## 2024-03-17 ENCOUNTER — Ambulatory Visit (INDEPENDENT_AMBULATORY_CARE_PROVIDER_SITE_OTHER): Payer: Self-pay | Admitting: Dietician

## 2024-03-17 ENCOUNTER — Other Ambulatory Visit: Payer: Self-pay

## 2024-03-17 ENCOUNTER — Encounter: Payer: Self-pay | Attending: Obstetrics and Gynecology | Admitting: Dietician

## 2024-03-17 DIAGNOSIS — O24410 Gestational diabetes mellitus in pregnancy, diet controlled: Secondary | ICD-10-CM | POA: Insufficient documentation

## 2024-03-17 DIAGNOSIS — Z3A17 17 weeks gestation of pregnancy: Secondary | ICD-10-CM

## 2024-03-17 NOTE — Progress Notes (Signed)
 Patient was seen for Gestational Diabetes on 03/17/2024   Start time 1315 and End time 1406  Estimated due date: 08/25/2024; [redacted]w[redacted]d  Clinical: Medications:  Current Outpatient Medications:    Prenatal Vit-Fe Fumarate-FA (PRENATAL PLUS VITAMIN/MINERAL) 27-1 MG TABS, Take 1 tablet by mouth daily., Disp: 30 tablet, Rfl: 11   doxylamine, Sleep, (UNISOM SLEEPTABS) 25 MG tablet, Take 1 tablet (25 mg total) by mouth at bedtime as needed. (Patient not taking: Reported on 03/17/2024), Disp: 30 tablet, Rfl: 3   pyridOXINE (VITAMIN B6) 100 MG tablet, Take 1 tablet (100 mg total) by mouth daily. (Patient not taking: Reported on 03/17/2024), Disp: 30 tablet, Rfl: 3  Medical History:  Past Medical History:  Diagnosis Date   GERD (gastroesophageal reflux disease)    occasional -diet controlled, no meds   Gestational diabetes    with first preg   Headache    Molar pregnancy 2019    Labs: OGTT fasting 86, 1 hour 183, 2 hour 129 on 03/11/2024 Lab Results  Component Value Date   HGBA1C 5.6 02/23/2024    Dietary and Lifestyle History: Pt present today alone. Pt reports previous gestational diabetes controlled with diet and exercise. 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 Pt reports she livers with her 75 year old daughter. Pt reports she  does all the cooking and shopping. Pt reports nausea during first trimester that is improving. All Pt's questions were answered during this encounter.   Physical Activity: 2-3 days weekly walking 30 minutes Stress: 4 out of 10 / self care includes time alone, walking, go to the park  Sleep: poor   24 hr Recall:  First Meal: greek flavored yogurt, berries or 1-2 whole wheat toast with avocado, orange juice Snack:  none or 1/2 jicama or 2 cuties Second meal:  chicken, beans, 3 small corn tortilla, juice Snack:  none Third meal:  3 chicken,cheese quesadilla with lettuce, salsa  Snack:  none Beverages:  juice, water with lemon, water   NUTRITION INTERVENTION   Nutrition education (E-1) on the following topics:   Initial Follow-up  [x]  []  Definition of Gestational Diabetes [x]  []  Why dietary management is important in controlling blood glucose [x]  []  Effects each nutrient has on blood glucose levels [x]  []  Simple carbohydrates vs complex carbohydrates [x]  []  Fluid intake [x]  []  Creating a balanced meal plan [x]  []  Carbohydrate counting  [x]  []  When to check blood glucose levels [x]  []  Proper blood glucose monitoring techniques [x]  []  Effect of stress and stress reduction techniques  [x]  []  Exercise effect on blood glucose levels, appropriate exercise during pregnancy [x]  []  Importance of limiting caffeine and abstaining from alcohol and smoking [x]  []  Medications used for blood sugar control during pregnancy [x]  []  Hypoglycemia and rule of 15 [x]  []  Postpartum self care  Blood glucose monitor given: [employer] Code Lot # [id number] Exp: 05/28/2025 CBG: 125 mg/dL, reported as 2 hour post prandial    Patient instructed to monitor glucose levels: QID FBS: 60 - <= 95 mg/dL; 2 hour: <= 784 mg/dL  Patient received handouts: Nutrition Diabetes and Pregnancy Carbohydrate Counting List Blood glucose log Snack ideas for diabetes during pregnancy  Patient will be seen for follow-up as needed.

## 2024-03-25 ENCOUNTER — Encounter: Payer: Self-pay | Admitting: Obstetrics and Gynecology

## 2024-03-25 ENCOUNTER — Ambulatory Visit: Payer: Self-pay | Admitting: Obstetrics and Gynecology

## 2024-03-25 ENCOUNTER — Other Ambulatory Visit: Payer: Self-pay

## 2024-03-25 VITALS — BP 121/72 | HR 95 | Wt 175.0 lb

## 2024-03-25 DIAGNOSIS — O0992 Supervision of high risk pregnancy, unspecified, second trimester: Secondary | ICD-10-CM

## 2024-03-25 DIAGNOSIS — R519 Headache, unspecified: Secondary | ICD-10-CM

## 2024-03-25 DIAGNOSIS — O0990 Supervision of high risk pregnancy, unspecified, unspecified trimester: Secondary | ICD-10-CM

## 2024-03-25 DIAGNOSIS — O24419 Gestational diabetes mellitus in pregnancy, unspecified control: Secondary | ICD-10-CM

## 2024-03-25 DIAGNOSIS — Z3A18 18 weeks gestation of pregnancy: Secondary | ICD-10-CM

## 2024-03-25 MED ORDER — PROCHLORPERAZINE MALEATE 10 MG PO TABS: 10.0000 mg | ORAL_TABLET | Freq: Four times a day (QID) | ORAL | 1 refills | Status: DC | PRN

## 2024-03-25 NOTE — Progress Notes (Addendum)
   PRENATAL VISIT NOTE  Subjective:  Erica Hall is a 28 y.o. [phone] at [redacted]w[redacted]d being seen today for ongoing prenatal care.  She is currently monitored for the following issues for this high-risk pregnancy and has History of molar pregnancy; Supervision of high risk pregnancy, antepartum; and Gestational diabetes on their problem list.  Patient reports headache - gets them 3 times a week.  Contractions: Not present. Vag. Bleeding: None.   . Denies leaking of fluid.   The following portions of the patient's history were reviewed and updated as appropriate: allergies, current medications, past family history, past medical history, past social history, past surgical history and problem list.   Objective:   Vitals:   03/25/24 1142  BP: 121/72  Pulse: 95  Weight: 175 lb (79.4 kg)    Fetal Status: Fetal Heart Rate (bpm): 154         General:  Alert, oriented and cooperative. Patient is in no acute distress.  Skin: Skin is warm and dry. No rash noted.   Cardiovascular: Normal heart rate noted  Respiratory: Normal respiratory effort, no problems with respiration noted  Abdomen: Soft, gravid, appropriate for gestational age.  Pain/Pressure: Absent     Pelvic: Cervical exam deferred        Extremities: Normal range of motion.  Edema: Trace  Mental Status: Normal mood and affect. Normal behavior. Normal judgment and thought content.   Assessment and Plan:  Pregnancy: G4P1021 at [redacted]w[redacted]d 1. Supervision of high risk pregnancy, antepartum (Primary) MSAFP today Anatomy next week Nausea improved.  Discussed measures for headaches.   2. Gestational diabetes mellitus (GDM) in second trimester, gestational diabetes method of control unspecified Brought log - fastings 78-93, 2h PP - 69-129   3. Pregnancy with 18 completed weeks gestation   Preterm labor symptoms and general obstetric precautions including but not limited to vaginal bleeding, contractions, leaking of fluid and fetal  movement were reviewed in detail with the patient. Please refer to After Visit Summary for other counseling recommendations.   No follow-ups on file.  Future Appointments  Date Time Provider Department Center  04/01/2024  8:00 AM [facility] NURSE [facility]  04/01/2024  8:15 AM [facility] PROVIDER 1 [facility]  04/01/2024  8:30 AM [facility] US5 [facility]    Milas Hock, MD

## 2024-03-25 NOTE — Patient Instructions (Addendum)
 Headache Prevention Vitamin B12 100 mg daily Magnesium Glycinate 400 mg daily  Headache Treatment Benadryl (or Unisom) OTC + Compazine Excedrin migraine  For Athlete's foot - Powder (corn starch based) - Clotrimazole

## 2024-03-30 ENCOUNTER — Ambulatory Visit: Payer: Self-pay | Attending: Obstetrics and Gynecology | Admitting: *Deleted

## 2024-03-30 ENCOUNTER — Encounter: Payer: Self-pay | Admitting: Obstetrics and Gynecology

## 2024-03-30 DIAGNOSIS — Z8759 Personal history of other complications of pregnancy, childbirth and the puerperium: Secondary | ICD-10-CM

## 2024-03-30 DIAGNOSIS — Z8632 Personal history of gestational diabetes: Secondary | ICD-10-CM

## 2024-03-30 LAB — AFP, SERUM, OPEN SPINA BIFIDA
AFP MoM: 0.93
AFP Value: 37.1 ng/mL
Gest. Age on Collection Date: 18.1 wk
Maternal Age At EDD: 28.4 a
OSBR Risk 1 IN: 10000
Test Results:: NEGATIVE
Weight: 175 [lb_av]

## 2024-03-30 NOTE — Progress Notes (Signed)
 New OB Intake  I connected with Erica Hall  on 03/30/24 at [phone] by phone and verified that I am speaking with the correct person using two identifiers. [facility] and pt is located at home.   I explained I am completing New Patient Intake today. We discussed EDD of 08/25/2024, by Last Menstrual Period. Pt is G4P1021. I reviewed her allergies, medications and Medical/Surgical/OB history.    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   All questions were answered.

## 2024-04-01 ENCOUNTER — Ambulatory Visit: Payer: Self-pay | Attending: Obstetrics and Gynecology

## 2024-04-01 ENCOUNTER — Other Ambulatory Visit: Payer: Self-pay | Admitting: *Deleted

## 2024-04-01 ENCOUNTER — Ambulatory Visit (HOSPITAL_BASED_OUTPATIENT_CLINIC_OR_DEPARTMENT_OTHER): Payer: Self-pay | Admitting: Obstetrics

## 2024-04-01 ENCOUNTER — Ambulatory Visit: Payer: Self-pay

## 2024-04-01 VITALS — BP 125/67 | HR 74

## 2024-04-01 DIAGNOSIS — Z3A19 19 weeks gestation of pregnancy: Secondary | ICD-10-CM

## 2024-04-01 DIAGNOSIS — O24410 Gestational diabetes mellitus in pregnancy, diet controlled: Secondary | ICD-10-CM

## 2024-04-01 DIAGNOSIS — O09292 Supervision of pregnancy with other poor reproductive or obstetric history, second trimester: Secondary | ICD-10-CM

## 2024-04-01 DIAGNOSIS — O24419 Gestational diabetes mellitus in pregnancy, unspecified control: Secondary | ICD-10-CM

## 2024-04-01 DIAGNOSIS — O99212 Obesity complicating pregnancy, second trimester: Secondary | ICD-10-CM

## 2024-04-01 DIAGNOSIS — E669 Obesity, unspecified: Secondary | ICD-10-CM

## 2024-04-01 DIAGNOSIS — O0990 Supervision of high risk pregnancy, unspecified, unspecified trimester: Secondary | ICD-10-CM

## 2024-04-01 NOTE — Progress Notes (Signed)
 MFM Consult Note  Alia Parsley is currently at 19 weeks and 1 day.  She was seen due to early onset diet-controlled gestational diabetes.  She reports that her fingerstick values have been within normal limits.  She has a history of a molar pregnancy in 2019.  She had a cell free DNA test earlier in her pregnancy which indicated a low risk for trisomy 14, 57, and 13. A female fetus is predicted.   She was informed that the fetal growth and amniotic fluid level were appropriate for her gestational age.   There were no obvious fetal anomalies noted on today's ultrasound exam.  However, today's exam was limited due to the fetal position.  A normal-appearing anterior placenta was noted today.  The patient was informed that anomalies may be missed due to technical limitations. If the fetus is in a suboptimal position or maternal habitus is increased, visualization of the fetus in the maternal uterus may be impaired.  The following were discussed during today's consultation:  Gestational diabetes and pregnancy  The implications and management of diabetes in pregnancy was discussed in detail with the patient.    She was advised to continue to monitor her fingersticks 4 times daily (fasting and 2 hours after each meal).    She was advised that our goals for her fingerstick values are fasting values of 90-95 or less and two-hour postprandial values of 120 or less.    Should the majority of her fingerstick results be above these values, she may have to be started on insulin or metformin to help her achieve better glycemic control.   The patient was advised that getting her fingerstick values as close to these goals as possible would provide her with the most optimal obstetrical outcome.  The increased risk of polyhydramnios, fetal macrosomia, and preeclampsia associated with diabetes was also discussed.    She was advised to start taking a daily baby aspirin (81 mg) for preeclampsia  prophylaxis.  Due to gestational diabetes, we will continue to follow her with monthly growth ultrasounds.    Weekly fetal testing should be started at 32 weeks should she require insulin or metformin for treatment.  The patient was advised that delivery for well-controlled diabetes in pregnancy is usually recommended at around 39 weeks.    Delivery at 37 weeks may be considered should her glycemic control be poor.  The patient stated that all of her questions were answered today.  A total of 30 minutes was spent counseling and coordinating the care for this patient.  Greater than 50% of the time was spent in direct face-to-face contact.

## 2024-04-12 ENCOUNTER — Encounter: Payer: Self-pay | Admitting: Obstetrics and Gynecology

## 2024-04-22 ENCOUNTER — Encounter: Payer: Self-pay | Admitting: Obstetrics & Gynecology

## 2024-04-22 ENCOUNTER — Ambulatory Visit (INDEPENDENT_AMBULATORY_CARE_PROVIDER_SITE_OTHER): Payer: Self-pay | Admitting: Obstetrics & Gynecology

## 2024-04-22 ENCOUNTER — Other Ambulatory Visit: Payer: Self-pay

## 2024-04-22 VITALS — BP 105/70 | HR 86 | Wt 177.2 lb

## 2024-04-22 DIAGNOSIS — O99712 Diseases of the skin and subcutaneous tissue complicating pregnancy, second trimester: Secondary | ICD-10-CM

## 2024-04-22 DIAGNOSIS — O24410 Gestational diabetes mellitus in pregnancy, diet controlled: Secondary | ICD-10-CM

## 2024-04-22 DIAGNOSIS — O0990 Supervision of high risk pregnancy, unspecified, unspecified trimester: Secondary | ICD-10-CM

## 2024-04-22 DIAGNOSIS — O0992 Supervision of high risk pregnancy, unspecified, second trimester: Secondary | ICD-10-CM

## 2024-04-22 DIAGNOSIS — Z3A22 22 weeks gestation of pregnancy: Secondary | ICD-10-CM

## 2024-04-22 DIAGNOSIS — L299 Pruritus, unspecified: Secondary | ICD-10-CM

## 2024-04-22 MED ORDER — ASPIRIN 81 MG PO TBEC: 81.0000 mg | DELAYED_RELEASE_TABLET | Freq: Every day | ORAL | 2 refills | Status: DC

## 2024-04-22 NOTE — Progress Notes (Signed)
 PRENATAL VISIT NOTE  Subjective:  Erica Hall is a 28 y.o. [phone] at [redacted]w[redacted]d being seen today for ongoing prenatal care.  She is currently monitored for the following issues for this high-risk pregnancy and has History of molar pregnancy; Supervision of high risk pregnancy, antepartum; and Gestational diabetes on their problem list.  Patient reports  whole body itching for a few days. Did not use any new detergents, soaps, perfumes . No history of cholestasis.  Contractions: Not present.  .  Movement: Present. Denies leaking of fluid.   The following portions of the patient's history were reviewed and updated as appropriate: allergies, current medications, past family history, past medical history, past social history, past surgical history and problem list.   Objective:   Vitals:   04/22/24 0834  BP: 105/70  Pulse: 86  Weight: 177 lb 3.2 oz (80.4 kg)    Fetal Status: Fetal Heart Rate (bpm): 140   Movement: Present     General:  Alert, oriented and cooperative. Patient is in no acute distress.  Skin: Skin is warm and dry. No rash noted.   Cardiovascular: Normal heart rate noted  Respiratory: Normal respiratory effort, no problems with respiration noted  Abdomen: Soft, gravid, appropriate for gestational age.  Pain/Pressure: Present (depends on activity)     Pelvic: Cervical exam deferred        Extremities: Normal range of motion.  Edema: Trace  Mental Status: Normal mood and affect. Normal behavior. Normal judgment and thought content.    US  MFM OB DETAIL +14 WK Result Date: 04/01/2024 ----------------------------------------------------------------------  OBSTETRICS REPORT                       (Signed Final 04/01/2024 02:44 pm) ---------------------------------------------------------------------- Patient Info  ID #:       [id number]                          D.O.B.:  1996-08-18 (28 yrs)(F)  Name:       Erica Hall                    Visit Date: 04/01/2024 08:21 am               BERMUDEZ ---------------------------------------------------------------------- Performed By  Attending:        Sal Crass MD         Ref. Address:     [facility]  Performed By:     Elspeth Hals       [facility]                                                             [facility]  Referred By:      Lacey Pian                    MD ---------------------------------------------------------------------- Orders  #  Description                           Code        Ordered By  1  US  MFM OB DETAIL +14 WK               76811.01    Lacey Pian ----------------------------------------------------------------------  #  Order #                     Accession #                Episode #  1  [id number]                   [id number]                 [id number] ---------------------------------------------------------------------- Indications  Gestational diabetes in pregnancy, diet        O24.410  controlled  Obesity complicating pregnancy, second         O99.212  trimester (pre-G BMI 32)  Poor obstetrical history (hx molar pregnancy)  O09.299  Encounter for antenatal screening for          Z36.3  malformations  [redacted] weeks gestation of pregnancy                Z3A.19  LR NIPS - Female, Negative Horizon,  Negative AFP ---------------------------------------------------------------------- Vital Signs  BP:          125/67 ---------------------------------------------------------------------- Fetal Evaluation  Num Of Fetuses:         1  Fetal Heart Rate(bpm):  143  Cardiac Activity:       Observed  Presentation:           Cephalic  Placenta:               Anterior  P. Cord Insertion:      Visualized, central  Amniotic Fluid  AFI FV:      Within normal limits                              Largest Pocket(cm)                              5.86  ---------------------------------------------------------------------- Biometry  BPD:      43.7  mm     G. Age:  19w 2d         54  %    CI:        75.73   %    70 - 86                                                          FL/HC:      18.6   %    16.1 - 18.3  HC:      159.2  mm     G. Age:  18w 5d  25  %    HC/AC:      1.06        1.09 - 1.39  AC:      149.6  mm     G. Age:  20w 1d         79  %    FL/BPD:     67.7   %  FL:       29.6  mm     G. Age:  19w 1d         42  %    FL/AC:      19.8   %    20 - 24  HUM:      30.7  mm     G. Age:  20w 1d         77  %  CER:      18.8  mm     G. Age:  18w 3d         14  %  NFT:       3.0  mm  LV:        5.4  mm  CM:        5.6  mm  Est. FW:     302  gm    0 lb 11 oz      73  % ---------------------------------------------------------------------- OB History  Gravidity:    4         Term:   1        Prem:   0        SAB:   1  TOP:          0       Ectopic:  0        Living: 1 ---------------------------------------------------------------------- Gestational Age  LMP:           19w 1d        Date:  11/19/23                 EDD:   08/25/24  U/S Today:     19w 2d                                        EDD:   08/24/24  Best:          19w 1d     Det. By:  LMP  (11/19/23)          EDD:   08/25/24 ---------------------------------------------------------------------- Targeted Anatomy  Central Nervous System  Calvarium/Cranial V.:  Appears normal         Cereb./Vermis:          Appears normal  Cavum:                 Appears normal         Cisterna Magna:         Appears normal  Lateral Ventricles:    Appears normal         Midline Falx:           Appears normal  Choroid Plexus:        Appears normal  Spine  Cervical:              Appears normal         Sacral:  Appears normal  Thoracic:              Appears normal         Shape/Curvature:        Appears normal  Lumbar:                Appears normal  Head/Neck  Lips:                  Appears normal         Profile:                 Appears normal  Neck:                  Appears normal         Orbits/Eyes:            Appears normal  Nuchal Fold:           Appears normal         Mandible:               Appears normal  Nasal Bone:            Present                Maxilla:                Appears normal  Thorax  4 Chamber View:        Appears normal         Interventr. Septum:     Appears normal  Cardiac Rhythm:        Normal                 Cardiac Axis:           Normal  Cardiac Situs:         Appears normal         Diaphragm:              Appears normal  Rt Outflow Tract:      Appears normal         3 Vessel View:          Appears normal  Lt Outflow Tract:      Appears normal         3 V Trachea View:       Appears normal  Aortic Arch:           Appears normal         IVC:                    Appears normal  Ductal Arch:           Appears normal         Crossing:               Appears normal  SVC:                   Appears normal  Abdomen  Ventral Wall:          Appears normal         Lt Kidney:              Appears normal  Cord Insertion:        Appears normal         Rt Kidney:              Appears normal  Situs:  Appears normal         Bladder:                Appears normal  Stomach:               Appears normal  Extremities  Lt Humerus:            Appears normal         Lt Femur:               Appears normal  Rt Humerus:            Appears normal         Rt Femur:               Appears normal  Lt Forearm:            Appears normal         Lt Lower Leg:           Appears normal  Rt Forearm:            Appears normal         Rt Lower Leg:           Appears normal  Lt Hand:               Open hand nml          Lt Foot:                Nml heel/foot  Rt Hand:               Open hand nml          Rt Foot:                Nml heel/foot  Other  Umbilical Cord:        Normal 3-vessel        Genitalia:              Female-nml  Comment:     Technically difficult due to maternal habitus. Fetal anatomic               survey  complete. ---------------------------------------------------------------------- Cervix Uterus Adnexa  Cervix  Length:            3.4  cm.  Normal appearance by transabdominal scan  Uterus  No abnormality visualized.  Right Ovary  Not visualized.  Left Ovary  Size(cm)     3.61   x   2.93   x  2.58      Vol(ml): 14.29  Within normal limits.  Cul De Sac  No free fluid seen.  Adnexa  No adnexal mass visualized ---------------------------------------------------------------------- Comments  Zala Varela Bermudez is currently at 19 weeks and 1 day.  She was seen due to early onset diet-controlled gestational  diabetes.  She reports that her fingerstick values have been  within normal limits.  She has a history of a molar pregnancy in 2019.  She had a cell free DNA test earlier in her pregnancy which  indicated a low risk for trisomy 78, 68, and 13. A female fetus  is predicted.  She was informed that the fetal growth and amniotic fluid  level were appropriate for her gestational age.  There were no obvious fetal anomalies noted on today's  ultrasound exam.  However, today's exam was limited due to  the fetal position.  A normal-appearing anterior placenta was noted today.  The patient was informed that anomalies  may be missed due  to technical limitations. If the fetus is in a suboptimal position  or maternal habitus is increased, visualization of the fetus in  the maternal uterus may be impaired.  The following were discussed during today's consultation:  Gestational diabetes and pregnancy  The implications and management of diabetes in pregnancy  was discussed in detail with the patient.  She was advised to continue to monitor her fingersticks 4  times daily (fasting and 2 hours after each meal).  She was advised that our goals for her fingerstick values are  fasting values of 90-95 or less and two-hour postprandial  values of 120 or less.  Should the majority of her fingerstick results be above these  values, she may  have to be started on insulin or metformin to  help her achieve better glycemic control.  The patient was advised that getting her fingerstick values as  close to these goals as possible would provide her with the  most optimal obstetrical outcome.  The increased risk of polyhydramnios, fetal macrosomia, and  preeclampsia associated with diabetes was also discussed.  She was advised to start taking a daily baby aspirin (81 mg)  for preeclampsia prophylaxis.  Due to gestational diabetes, we will continue to follow her  with monthly growth ultrasounds.  Weekly fetal testing should be started at 32 weeks should  she require insulin or metformin for treatment.  The patient was advised that delivery for well-controlled  diabetes in pregnancy is usually recommended at around 39  weeks.  Delivery at 37 weeks may be considered should her glycemic  control be poor.  The patient stated that all of her questions were answered  today.  A total of 30 minutes was spent counseling and coordinating  the care for this patient.  Greater than 50% of the time was  spent in direct face-to-face contact. ----------------------------------------------------------------------                   Sal Crass, MD Electronically Signed Final Report   04/01/2024 02:44 pm ----------------------------------------------------------------------    Assessment and Plan:  Pregnancy: O9G2952 at [redacted]w[redacted]d 1. Pruritus of pregnancy in second trimester Recommended antihistamine (Zyrtec) for now, will check cholestasis labs and follow accordingly. - Comprehensive metabolic panel with GFR - Bile acids, total  2. Diet controlled gestational diabetes mellitus (GDM) in second trimester (Primary) Did not bring log, but reports fastings less than 90, PP 70s-120s.  Continue diet and exercise.  Will start ASA for PEC prophylaxis. Advised to bring log every visit. - aspirin EC 81 MG tablet; Take 1 tablet (81 mg total) by mouth at bedtime. Start taking daily for  the rest of pregnancy for prevention of preeclampsia  Dispense: 300 tablet; Refill: 2  3. [redacted] weeks gestation of pregnancy 4. Supervision of high risk pregnancy, antepartum No other concerns. Preterm labor symptoms and general obstetric precautions including but not limited to vaginal bleeding, contractions, leaking of fluid and fetal movement were reviewed in detail with the patient. Please refer to After Visit Summary for other counseling recommendations.   Return in about 3 weeks (around 05/13/2024) for OFFICE OB VISIT (MD only).  Future Appointments  Date Time Provider Department Center  05/05/2024  8:00 AM [facility] PROVIDER 1 [facility]  05/05/2024  8:30 AM [facility] US2 [facility]    Lenoard Rad, MD

## 2024-04-22 NOTE — Patient Instructions (Addendum)
 Take Cetririzine (Zyrtec) one tablet daily to help with itching Will follow up labs to evaluate for possible cholestasis Take low dose Aspirin 81 mg daily to prevent preeclampsia

## 2024-04-23 LAB — COMPREHENSIVE METABOLIC PANEL WITH GFR
ALT: 12 IU/L (ref 0–32)
AST: 12 IU/L (ref 0–40)
Albumin: 3.4 g/dL — ABNORMAL LOW (ref 4.0–5.0)
Alkaline Phosphatase: 81 IU/L (ref 44–121)
BUN/Creatinine Ratio: 14 (ref 9–23)
BUN: 7 mg/dL (ref 6–20)
Bilirubin Total: 0.5 mg/dL (ref 0.0–1.2)
CO2: 16 mmol/L — ABNORMAL LOW (ref 20–29)
Calcium: 8.3 mg/dL — ABNORMAL LOW (ref 8.7–10.2)
Chloride: 108 mmol/L — ABNORMAL HIGH (ref 96–106)
Creatinine, Ser: 0.49 mg/dL — ABNORMAL LOW (ref 0.57–1.00)
Globulin, Total: 1.9 g/dL (ref 1.5–4.5)
Glucose: 129 mg/dL — ABNORMAL HIGH (ref 70–99)
Potassium: 3.8 mmol/L (ref 3.5–5.2)
Sodium: 140 mmol/L (ref 134–144)
Total Protein: 5.3 g/dL — ABNORMAL LOW (ref 6.0–8.5)
eGFR: 132 mL/min/{1.73_m2} (ref >59)

## 2024-04-23 LAB — BILE ACIDS, TOTAL: Bile Acids Total: <1 umol/L (ref 0.0–10.0)

## 2024-04-25 ENCOUNTER — Encounter: Payer: Self-pay | Admitting: Obstetrics & Gynecology

## 2024-05-05 ENCOUNTER — Ambulatory Visit: Payer: Self-pay | Attending: Obstetrics | Admitting: Maternal & Fetal Medicine

## 2024-05-05 ENCOUNTER — Other Ambulatory Visit: Payer: Self-pay | Admitting: *Deleted

## 2024-05-05 ENCOUNTER — Ambulatory Visit: Payer: Self-pay

## 2024-05-05 VITALS — BP 121/66 | HR 79

## 2024-05-05 DIAGNOSIS — O24419 Gestational diabetes mellitus in pregnancy, unspecified control: Secondary | ICD-10-CM

## 2024-05-05 DIAGNOSIS — O24410 Gestational diabetes mellitus in pregnancy, diet controlled: Secondary | ICD-10-CM | POA: Insufficient documentation

## 2024-05-05 DIAGNOSIS — O99212 Obesity complicating pregnancy, second trimester: Secondary | ICD-10-CM | POA: Insufficient documentation

## 2024-05-05 DIAGNOSIS — Z3A24 24 weeks gestation of pregnancy: Secondary | ICD-10-CM

## 2024-05-05 DIAGNOSIS — Z8759 Personal history of other complications of pregnancy, childbirth and the puerperium: Secondary | ICD-10-CM

## 2024-05-05 DIAGNOSIS — Z362 Encounter for other antenatal screening follow-up: Secondary | ICD-10-CM | POA: Insufficient documentation

## 2024-05-05 DIAGNOSIS — O09292 Supervision of pregnancy with other poor reproductive or obstetric history, second trimester: Secondary | ICD-10-CM

## 2024-05-05 DIAGNOSIS — O0990 Supervision of high risk pregnancy, unspecified, unspecified trimester: Secondary | ICD-10-CM

## 2024-05-05 DIAGNOSIS — E669 Obesity, unspecified: Secondary | ICD-10-CM

## 2024-05-05 NOTE — Progress Notes (Signed)
 After review, MFM consult with provider is not indicated for today  Penney Bowling, DO 05/05/2024 8:56 AM  [facility]

## 2024-05-23 ENCOUNTER — Encounter: Payer: Self-pay | Admitting: Obstetrics and Gynecology

## 2024-05-23 ENCOUNTER — Other Ambulatory Visit: Payer: Self-pay

## 2024-05-23 ENCOUNTER — Ambulatory Visit: Payer: Self-pay | Admitting: Obstetrics and Gynecology

## 2024-05-23 VITALS — BP 122/74 | HR 84 | Wt 181.6 lb

## 2024-05-23 DIAGNOSIS — O09892 Supervision of other high risk pregnancies, second trimester: Secondary | ICD-10-CM

## 2024-05-23 DIAGNOSIS — Z3A26 26 weeks gestation of pregnancy: Secondary | ICD-10-CM

## 2024-05-23 DIAGNOSIS — Z8759 Personal history of other complications of pregnancy, childbirth and the puerperium: Secondary | ICD-10-CM

## 2024-05-23 DIAGNOSIS — O24419 Gestational diabetes mellitus in pregnancy, unspecified control: Secondary | ICD-10-CM

## 2024-05-23 DIAGNOSIS — O0990 Supervision of high risk pregnancy, unspecified, unspecified trimester: Secondary | ICD-10-CM

## 2024-05-23 NOTE — Progress Notes (Signed)
   PRENATAL VISIT NOTE  Subjective:  Erica Hall is a 28 y.o. [phone] at [redacted]w[redacted]d being seen today for ongoing prenatal care.  She is currently monitored for the following issues for this low-risk pregnancy and has History of molar pregnancy; Supervision of high risk pregnancy, antepartum; and Gestational diabetes on their problem list.  Patient reports two episodes of heart palpitations - felt like it was racing. .  Contractions: Not present. Vag. Bleeding: None.  Movement: Present. Denies leaking of fluid.   The following portions of the patient's history were reviewed and updated as appropriate: allergies, current medications, past family history, past medical history, past social history, past surgical history and problem list.   Objective:    Vitals:   05/23/24 0822  BP: 122/74  Pulse: 84  Weight: 181 lb 9.6 oz (82.4 kg)    Fetal Status:  Fetal Heart Rate (bpm): 145   Movement: Present    General: Alert, oriented and cooperative. Patient is in no acute distress.  Skin: Skin is warm and dry. No rash noted.   Cardiovascular: Normal heart rate noted  Respiratory: Normal respiratory effort, no problems with respiration noted  Abdomen: Soft, gravid, appropriate for gestational age.  Pain/Pressure: Absent     Pelvic: Cervical exam deferred        Extremities: Normal range of motion.  Edema: None  Mental Status: Normal mood and affect. Normal behavior. Normal judgment and thought content.     Assessment and Plan:  Pregnancy: G4P1021 at [redacted]w[redacted]d 1. Gestational diabetes mellitus (GDM) in second trimester, gestational diabetes method of control unspecified (Primary) 5/15 groth wnl - 68%ile, nml afi and ac.  CBGs reviewed: see above  2. Supervision of high risk pregnancy, antepartum Routine labs today.  She will consider tdap.  MOC discussed again - she will consider.   3. History of molar pregnancy Placenta to path at delivery  4. Pregnancy with 26 completed weeks  gestation   Preterm labor symptoms and general obstetric precautions including but not limited to vaginal bleeding, contractions, leaking of fluid and fetal movement were reviewed in detail with the patient. Please refer to After Visit Summary for other counseling recommendations.   Return in about 4 weeks (around 06/20/2024) for OB VISIT, MD or APP.  Future Appointments  Date Time Provider Department Center  06/16/2024  8:00 AM [facility] PROVIDER 1 [facility]  06/16/2024  8:30 AM [facility] US2 [facility]  07/28/2024  8:00 AM [facility] PROVIDER 1 [facility]  07/28/2024  8:30 AM [facility] US2 [facility]    Lacey Pian, MD

## 2024-05-24 ENCOUNTER — Ambulatory Visit: Payer: Self-pay | Admitting: Obstetrics and Gynecology

## 2024-05-24 LAB — RPR: RPR Ser Ql: NONREACTIVE

## 2024-05-24 LAB — CBC
Hematocrit: 37.8 % (ref 34.0–46.6)
Hemoglobin: 11.9 g/dL (ref 11.1–15.9)
MCH: 30.5 pg (ref 26.6–33.0)
MCHC: 31.5 g/dL (ref 31.5–35.7)
MCV: 97 fL (ref 79–97)
Platelets: 264 10*3/uL (ref 150–450)
RBC: 3.9 x10E6/uL (ref 3.77–5.28)
RDW: 13 % (ref 11.7–15.4)
WBC: 7.5 10*3/uL (ref 3.4–10.8)

## 2024-05-24 LAB — HIV ANTIBODY (ROUTINE TESTING W REFLEX): HIV Screen 4th Generation wRfx: NONREACTIVE

## 2024-05-30 ENCOUNTER — Encounter: Payer: Self-pay | Admitting: Obstetrics and Gynecology

## 2024-06-03 ENCOUNTER — Encounter: Payer: Self-pay | Admitting: Obstetrics and Gynecology

## 2024-06-03 ENCOUNTER — Inpatient Hospital Stay (HOSPITAL_COMMUNITY): Payer: Self-pay

## 2024-06-03 ENCOUNTER — Encounter (HOSPITAL_COMMUNITY): Payer: Self-pay | Admitting: Obstetrics and Gynecology

## 2024-06-03 ENCOUNTER — Inpatient Hospital Stay (HOSPITAL_COMMUNITY)
Admission: AD | Admit: 2024-06-03 | Discharge: 2024-06-03 | Disposition: A | Discharge status: Home / Self Care | Payer: Self-pay | Attending: Obstetrics and Gynecology | Admitting: Obstetrics and Gynecology

## 2024-06-03 DIAGNOSIS — O09293 Supervision of pregnancy with other poor reproductive or obstetric history, third trimester: Secondary | ICD-10-CM | POA: Insufficient documentation

## 2024-06-03 DIAGNOSIS — O99613 Diseases of the digestive system complicating pregnancy, third trimester: Secondary | ICD-10-CM | POA: Insufficient documentation

## 2024-06-03 DIAGNOSIS — O26893 Other specified pregnancy related conditions, third trimester: Secondary | ICD-10-CM | POA: Insufficient documentation

## 2024-06-03 DIAGNOSIS — O26613 Liver and biliary tract disorders in pregnancy, third trimester: Secondary | ICD-10-CM

## 2024-06-03 DIAGNOSIS — O24419 Gestational diabetes mellitus in pregnancy, unspecified control: Secondary | ICD-10-CM | POA: Insufficient documentation

## 2024-06-03 DIAGNOSIS — R1010 Upper abdominal pain, unspecified: Secondary | ICD-10-CM | POA: Insufficient documentation

## 2024-06-03 DIAGNOSIS — Z3A28 28 weeks gestation of pregnancy: Secondary | ICD-10-CM

## 2024-06-03 DIAGNOSIS — K219 Gastro-esophageal reflux disease without esophagitis: Secondary | ICD-10-CM | POA: Insufficient documentation

## 2024-06-03 DIAGNOSIS — K8020 Calculus of gallbladder without cholecystitis without obstruction: Secondary | ICD-10-CM

## 2024-06-03 LAB — CBC WITH DIFFERENTIAL/PLATELET
Abs Immature Granulocytes: 0.12 10*3/uL — ABNORMAL HIGH (ref 0.00–0.07)
Basophils Absolute: 0 10*3/uL (ref 0.0–0.1)
Basophils Relative: 0 %
Eosinophils Absolute: 0.1 10*3/uL (ref 0.0–0.5)
Eosinophils Relative: 1 %
HCT: 35.1 % — ABNORMAL LOW (ref 36.0–46.0)
Hemoglobin: 11.7 g/dL — ABNORMAL LOW (ref 12.0–15.0)
Immature Granulocytes: 1 %
Lymphocytes Relative: 19 %
Lymphs Abs: 1.8 10*3/uL (ref 0.7–4.0)
MCH: 31.3 pg (ref 26.0–34.0)
MCHC: 33.3 g/dL (ref 30.0–36.0)
MCV: 93.9 fL (ref 80.0–100.0)
Monocytes Absolute: 0.6 10*3/uL (ref 0.1–1.0)
Monocytes Relative: 6 %
Neutro Abs: 7.2 10*3/uL (ref 1.7–7.7)
Neutrophils Relative %: 73 %
Platelets: 281 10*3/uL (ref 150–400)
RBC: 3.74 MIL/uL — ABNORMAL LOW (ref 3.87–5.11)
RDW: 12.8 % (ref 11.5–15.5)
WBC: 9.8 10*3/uL (ref 4.0–10.5)
nRBC: 0 % (ref 0.0–0.2)

## 2024-06-03 LAB — COMPREHENSIVE METABOLIC PANEL WITH GFR
ALT: 15 U/L (ref 0–44)
AST: 17 U/L (ref 15–41)
Albumin: 2.3 g/dL — ABNORMAL LOW (ref 3.5–5.0)
Alkaline Phosphatase: 105 U/L (ref 38–126)
Anion gap: 11 (ref 5–15)
BUN: 7 mg/dL (ref 6–20)
CO2: 21 mmol/L — ABNORMAL LOW (ref 22–32)
Calcium: 8 mg/dL — ABNORMAL LOW (ref 8.9–10.3)
Chloride: 105 mmol/L (ref 98–111)
Creatinine, Ser: 0.67 mg/dL (ref 0.44–1.00)
GFR, Estimated: >60 mL/min (ref >60)
Glucose, Bld: 133 mg/dL — ABNORMAL HIGH (ref 70–99)
Potassium: 3.7 mmol/L (ref 3.5–5.1)
Sodium: 137 mmol/L (ref 135–145)
Total Bilirubin: 0.8 mg/dL (ref 0.0–1.2)
Total Protein: 5.5 g/dL — ABNORMAL LOW (ref 6.5–8.1)

## 2024-06-03 LAB — URINALYSIS, ROUTINE W REFLEX MICROSCOPIC
Bilirubin Urine: NEGATIVE
Glucose, UA: 150 mg/dL — AB
Hgb urine dipstick: NEGATIVE
Ketones, ur: 5 mg/dL — AB
Nitrite: NEGATIVE
Protein, ur: 30 mg/dL — AB
Specific Gravity, Urine: 1.028 (ref 1.005–1.030)
pH: 5 (ref 5.0–8.0)

## 2024-06-03 LAB — LIPASE, BLOOD: Lipase: 30 U/L (ref 11–51)

## 2024-06-03 NOTE — MAU Provider Note (Signed)
 History     CSN: [id number]  Arrival date and time: 06/03/24 1341   None     Chief Complaint  Patient presents with   Abdominal Pain   HPI  Erica Hall is a 28 y.o. female [phone] @ [redacted]w[redacted]d here with upper abdominal pain that started 3-4 days ago. Pregnancy complicated by GERD, GDM- well controlled.  The pain is not similar to GERD pain. Laying down makes the pain better. Sitting up makes the pain worse. She had some nausea today, no vomiting. Patient is not having any lower abdominal pain. No bleeding, + fetal movement.   OB History     Gravida  4   Para  1   Term  1   Preterm  0   AB  2   Living  1      SAB  1   IAB  0   Ectopic  0   Multiple  0   Live Births  1        Obstetric Comments  1st preg was a molar preg         Past Medical History:  Diagnosis Date   GERD (gastroesophageal reflux disease)    occasional -diet controlled, no meds   Gestational diabetes    with first preg   Headache    Molar pregnancy 2019    Past Surgical History:  Procedure Laterality Date   DILATION AND EVACUATION N/A 12/30/2017   Procedure: DILATATION AND EVACUATION;  Surgeon: Jan Mcgill, MD;  Location: [facility];  Service: Gynecology;  Laterality: N/A;   WISDOM TOOTH EXTRACTION      Family History  Problem Relation Age of Onset   Diabetes Mother        pre-diabetic   Hypertension Father    Hyperlipidemia Father    Hypertension Paternal Aunt    Hypertension Paternal Uncle    Diabetes Maternal Grandmother    Hypertension Paternal Grandmother    Diabetes Paternal Grandmother    Heart disease Paternal Grandfather    Diabetes Paternal Grandfather    Asthma Neg Hx    Cancer Neg Hx    Stroke Neg Hx     Social History   Tobacco Use   Smoking status: Never   Smokeless tobacco: Never  Vaping Use   Vaping status: Never Used  Substance Use Topics   Alcohol use: Not Currently   Drug use: No    Allergies: No Known Allergies  Medications  Prior to Admission  Medication Sig Dispense Refill Last Dose/Taking   aspirin  EC 81 MG tablet Take 1 tablet (81 mg total) by mouth at bedtime. Start taking daily for the rest of pregnancy for prevention of preeclampsia 300 tablet 2 06/02/2024   Prenatal Vit-Fe Fumarate-FA (PRENATAL PLUS VITAMIN/MINERAL) 27-1 MG TABS Take 1 tablet by mouth daily. 30 tablet 11 06/02/2024   prochlorperazine  (COMPAZINE ) 10 MG tablet Take 1 tablet (10 mg total) by mouth every 6 (six) hours as needed (headache). 30 tablet 1    Results for orders placed or performed during the hospital encounter of 06/03/24 (from the past 48 hours)  Urinalysis, Routine w reflex microscopic -Urine, Clean Catch     Status: Abnormal   Collection Time: 06/03/24  2:13 PM  Result Value Ref Range   Color, Urine YELLOW YELLOW   APPearance HAZY (A) CLEAR   Specific Gravity, Urine 1.028 1.005 - 1.030   pH 5.0 5.0 - 8.0   Glucose, UA 150 (A) NEGATIVE mg/dL  Hgb urine dipstick NEGATIVE NEGATIVE   Bilirubin Urine NEGATIVE NEGATIVE   Ketones, ur 5 (A) NEGATIVE mg/dL   Protein, ur 30 (A) NEGATIVE mg/dL   Nitrite NEGATIVE NEGATIVE   Leukocytes,Ua TRACE (A) NEGATIVE   RBC / HPF 0-5 0 - 5 RBC/hpf   WBC, UA 11-20 0 - 5 WBC/hpf   Bacteria, UA RARE (A) NONE SEEN   Squamous Epithelial / HPF 11-20 0 - 5 /HPF   Mucus PRESENT     Comment: Performed at [facility], [address] [place], [place] [address]  CBC with Differential/Platelet     Status: Abnormal   Collection Time: 06/03/24  3:02 PM  Result Value Ref Range   WBC 9.8 4.0 - 10.5 K/uL   RBC 3.74 (L) 3.87 - 5.11 MIL/uL   Hemoglobin 11.7 (L) 12.0 - 15.0 g/dL   HCT 60.4 (L) 54.0 - 98.1 %   MCV 93.9 80.0 - 100.0 fL   MCH 31.3 26.0 - 34.0 pg   MCHC 33.3 30.0 - 36.0 g/dL   RDW 19.1 47.8 - 29.5 %   Platelets 281 150 - 400 K/uL   nRBC 0.0 0.0 - 0.2 %   Neutrophils Relative % 73 %   Neutro Abs 7.2 1.7 - 7.7 K/uL   Lymphocytes Relative 19 %   Lymphs Abs 1.8 0.7 - 4.0 K/uL    Monocytes Relative 6 %   Monocytes Absolute 0.6 0.1 - 1.0 K/uL   Eosinophils Relative 1 %   Eosinophils Absolute 0.1 0.0 - 0.5 K/uL   Basophils Relative 0 %   Basophils Absolute 0.0 0.0 - 0.1 K/uL   Immature Granulocytes 1 %   Abs Immature Granulocytes 0.12 (H) 0.00 - 0.07 K/uL    Comment: Performed at [facility], [address] [place], [place] [address]  Comprehensive metabolic panel     Status: Abnormal   Collection Time: 06/03/24  3:02 PM  Result Value Ref Range   Sodium 137 135 - 145 mmol/L   Potassium 3.7 3.5 - 5.1 mmol/L   Chloride 105 98 - 111 mmol/L   CO2 21 (L) 22 - 32 mmol/L   Glucose, Bld 133 (H) 70 - 99 mg/dL    Comment: Glucose reference range applies only to samples taken after fasting for at least 8 hours.   BUN 7 6 - 20 mg/dL   Creatinine, Ser 8.65 0.44 - 1.00 mg/dL   Calcium 8.0 (L) 8.9 - 10.3 mg/dL   Total Protein 5.5 (L) 6.5 - 8.1 g/dL   Albumin 2.3 (L) 3.5 - 5.0 g/dL   AST 17 15 - 41 U/L   ALT 15 0 - 44 U/L   Alkaline Phosphatase 105 38 - 126 U/L   Total Bilirubin 0.8 0.0 - 1.2 mg/dL   GFR, Estimated >78 >46 mL/min    Comment: (NOTE) Calculated using the CKD-EPI Creatinine Equation (2021)    Anion gap 11 5 - 15    Comment: Performed at [facility], [address] [place], [place] [address]  Lipase, blood     Status: None   Collection Time: 06/03/24  3:02 PM  Result Value Ref Range   Lipase 30 11 - 51 U/L    Comment: Performed at [facility], [address] [place], [place] [address]     US  ABDOMEN LIMITED RUQ (LIVER/GB) Result Date: 06/03/2024 CLINICAL DATA:  244010 Upper abdominal pain [id number] EXAM: ULTRASOUND ABDOMEN LIMITED RIGHT UPPER QUADRANT COMPARISON:  None Available. FINDINGS: Gallbladder: Biliary sludge with multiple small stones. No wall thickening or pericholecystic fluid. No sonographic Murphy's sign noted by sonographer. Common bile duct: Diameter: 4 mm Liver: Normal echogenicity. No focal lesion identified. No  intrahepatic biliary ductal dilation. Portal vein is patent on color Doppler imaging with normal direction of blood flow towards the liver. Right Kidney: Partially visualized. No mass. No hydronephrosis or nephrolithiasis. Other: None. IMPRESSION: Cholecystolithiasis. No changes of acute cholecystitis. Electronically Signed   By: Rance Burrows M.D.   On: 06/03/2024 16:08     Review of Systems  Constitutional:  Negative for fever.  Gastrointestinal:  Positive for abdominal pain and nausea. Negative for vomiting.   Physical Exam   Blood pressure 119/60, pulse 97, temperature 97.9 F (36.6 C), temperature source Oral, resp. rate 14, height 5' 1 (1.549 m), weight 83.1 kg, last menstrual period 11/19/2023, SpO2 99%, unknown if currently breastfeeding.  Physical Exam Constitutional:      General: She is not in acute distress.    Appearance: She is well-developed. She is not ill-appearing, toxic-appearing or diaphoretic.  Abdominal:     Tenderness: There is abdominal tenderness in the right upper quadrant, epigastric area and left upper quadrant.   Neurological:     Mental Status: She is alert and oriented to person, place, and time.   Psychiatric:        Behavior: Behavior normal.    Fetal Tracing: Baseline: 145 bpm Variability: Moderate  Accelerations: 15x15 Decelerations: None Toco: None  MAU Course  Procedures  MDM  CBC, CMP, Lipase   Assessment and Plan   A:  1. Cholelithiasis affecting pregnancy in third trimester, antepartum   2. [redacted] weeks gestation of pregnancy      P:  Dc home Low fat diet Follow up with OB as planned. Return to MAU if symptoms worsen  Shloma Roggenkamp, Juliette Oh, NP 06/03/2024 5:42 PM

## 2024-06-03 NOTE — MAU Note (Addendum)
 Erica Hall is a 28 y.o. at [redacted]w[redacted]d here in MAU reporting: upper mid abdominal pressure for a few days. States it is mainly when she is sitting she feels more pressure and it is harder to take a deep breath. Denies any CTXs, LOF, VB. Reports +FM Past 2 days she has felt nauseated in the mornings along with the pressure. The pressure does not change with fetal movement. States the pressure gets worse when she eats and feels full and bloated  GDM- diet controled    Onset of complaint: ongoing  Pain score: uncomfortable  Vitals:   06/03/24 1351  BP: 127/73  Pulse: (!) 101  Resp: 14  Temp: 97.9 F (36.6 C)  SpO2: 99%     FHT:150

## 2024-06-16 ENCOUNTER — Ambulatory Visit: Payer: Self-pay

## 2024-06-16 ENCOUNTER — Ambulatory Visit: Payer: Self-pay | Attending: Obstetrics and Gynecology | Admitting: Maternal & Fetal Medicine

## 2024-06-16 VITALS — BP 118/72 | HR 96

## 2024-06-16 DIAGNOSIS — O24410 Gestational diabetes mellitus in pregnancy, diet controlled: Secondary | ICD-10-CM | POA: Insufficient documentation

## 2024-06-16 DIAGNOSIS — O99212 Obesity complicating pregnancy, second trimester: Secondary | ICD-10-CM

## 2024-06-16 DIAGNOSIS — Z362 Encounter for other antenatal screening follow-up: Secondary | ICD-10-CM | POA: Insufficient documentation

## 2024-06-16 DIAGNOSIS — Z8759 Personal history of other complications of pregnancy, childbirth and the puerperium: Secondary | ICD-10-CM

## 2024-06-16 DIAGNOSIS — O24419 Gestational diabetes mellitus in pregnancy, unspecified control: Secondary | ICD-10-CM

## 2024-06-16 DIAGNOSIS — Z3A30 30 weeks gestation of pregnancy: Secondary | ICD-10-CM | POA: Insufficient documentation

## 2024-06-16 DIAGNOSIS — O09293 Supervision of pregnancy with other poor reproductive or obstetric history, third trimester: Secondary | ICD-10-CM | POA: Insufficient documentation

## 2024-06-16 DIAGNOSIS — O99213 Obesity complicating pregnancy, third trimester: Secondary | ICD-10-CM | POA: Insufficient documentation

## 2024-06-16 DIAGNOSIS — O0990 Supervision of high risk pregnancy, unspecified, unspecified trimester: Secondary | ICD-10-CM

## 2024-06-16 NOTE — Progress Notes (Unsigned)
   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 G4P1021 at [redacted]w[redacted]d here for ultrasound and consultation. Erica Hall is doing well today with ***no acute concerns. Today we focused on the following:   ***: ***  ***: ***  ***: ***  ***: ***   The patient had time to ask questions that were answered to her satisfaction.  She verbalized understanding and agrees to proceed with the plan below.  ***  There are limitations of prenatal ultrasound such as the inability to detect certain abnormalities due to poor visualization. Various factors such as fetal position, gestational age and maternal body habitus may increase the difficulty in visualizing the fetal anatomy.    Recommendations -Serial growth ultrasounds every 4-6 weeks until delivery -Antenatal testing to start around 32 weeks  -Delivery around *** weeks gestation  Review of Systems: A review of systems was performed and was negative except per HPI   Vitals and Physical Exam    06/16/2024    8:14 AM 06/03/2024    2:11 PM 06/03/2024    1:51 PM  Vitals with BMI  Height   5' 1  Weight   183 lbs 3 oz  BMI   34.63  Systolic 118 119 872  Diastolic 72 60 73  Pulse 96 97 101    Sitting comfortably on the sonogram table Nonlabored breathing Normal rate and rhythm Abdomen is nontender  Past pregnancies OB History  Gravida Para Term Preterm AB Living  4 1 1  0 2 1  SAB IAB Ectopic Multiple Live Births  1 0 0 0 1    # Outcome Date GA Lbr Len/2nd Weight Sex Type Anes PTL Lv  4 Current           3 SAB 08/2022          2 Term 12/18/20 [redacted]w[redacted]d 06:56 /  00:31 6 lb (2.722 kg) F Vag-Spont EPI  LIV     Complications: Gestational diabetes  1 Molar 12/30/17 [redacted]w[redacted]d           Obstetric Comments  1st preg was a molar preg     I spent *** minutes reviewing the patients chart, including labs and images as well as counseling the patient about her medical conditions. Greater than 50% of the time was spent in direct face-to-face patient counseling.  Erica Hall  MFM, [facility]   06/16/2024  8:43 AM

## 2024-06-16 NOTE — Progress Notes (Signed)
   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 G4P1021 at [redacted]w[redacted]d here for ultrasound and consultation. Erica Hall is doing well today with no acute concerns. Today we focused on the following:   Gestational diabetes: The patient reports her blood sugars well-controlled with diet and exercise alone.  I reviewed the importance of follow-up growth ultrasounds to assess the fetal weight.  Antenatal testing is not indicated unless medication is required to achieve proper glycemic control.  The patient had time to ask questions that were answered to her satisfaction.  She verbalized understanding and agrees to proceed with the plan below.  Sonographic findings Single intrauterine pregnancy at 30w 0d.  Fetal cardiac activity:  Observed and appears normal. Presentation: Cephalic. Interval fetal anatomy appears normal. Fetal biometry shows the estimated fetal weight at the 33 percentile. Amniotic fluid volume: Within normal limits. MVP: 3.67 cm. Placenta: Anterior.  There are limitations of prenatal ultrasound such as the inability to detect certain abnormalities due to poor visualization. Various factors such as fetal position, gestational age and maternal body habitus may increase the difficulty in visualizing the fetal anatomy.    Recommendations -Serial growth ultrasounds every 4-6 weeks until delivery -Delivery around 39-[redacted] weeks gestation  Review of Systems: A review of systems was performed and was negative except per HPI   Vitals and Physical Exam    06/16/2024    8:14 AM  06/03/2024    2:11 PM 06/03/2024    1:51 PM  Vitals with BMI  Height   5' 1  Weight   183 lbs 3 oz  BMI   34.63  Systolic 118 119 872  Diastolic 72 60 73  Pulse 96 97 101    Sitting comfortably on the sonogram table Nonlabored breathing Normal rate and rhythm Abdomen is nontender  Past pregnancies OB History  Gravida Para Term Preterm AB Living  4 1 1  0 2 1  SAB IAB Ectopic Multiple Live Births  1 0 0 0 1    # Outcome Date GA Lbr Len/2nd Weight Sex Type Anes PTL Lv  4 Current           3 SAB 08/2022          2 Term 12/18/20 [redacted]w[redacted]d 06:56 / 00:31 6 lb (2.722 kg) F Vag-Spont EPI  LIV     Complications: Gestational diabetes  1 Molar 12/30/17 [redacted]w[redacted]d           Obstetric Comments  1st preg was a molar preg     I spent 20 minutes reviewing the patients chart, including labs and images as well as counseling the patient about her medical conditions. Greater than 50% of the time was spent in direct face-to-face patient counseling.  Delora Smaller  MFM, [facility]   06/16/2024  9:41 AM

## 2024-06-23 ENCOUNTER — Other Ambulatory Visit: Payer: Self-pay

## 2024-06-23 ENCOUNTER — Ambulatory Visit (INDEPENDENT_AMBULATORY_CARE_PROVIDER_SITE_OTHER): Payer: Self-pay | Admitting: Family Medicine

## 2024-06-23 VITALS — BP 111/76 | HR 101 | Wt 182.9 lb

## 2024-06-23 DIAGNOSIS — O99713 Diseases of the skin and subcutaneous tissue complicating pregnancy, third trimester: Secondary | ICD-10-CM

## 2024-06-23 DIAGNOSIS — H539 Unspecified visual disturbance: Secondary | ICD-10-CM

## 2024-06-23 DIAGNOSIS — Z3A31 31 weeks gestation of pregnancy: Secondary | ICD-10-CM

## 2024-06-23 DIAGNOSIS — Z8759 Personal history of other complications of pregnancy, childbirth and the puerperium: Secondary | ICD-10-CM

## 2024-06-23 DIAGNOSIS — O09893 Supervision of other high risk pregnancies, third trimester: Secondary | ICD-10-CM

## 2024-06-23 DIAGNOSIS — O0990 Supervision of high risk pregnancy, unspecified, unspecified trimester: Secondary | ICD-10-CM

## 2024-06-23 DIAGNOSIS — Z23 Encounter for immunization: Secondary | ICD-10-CM

## 2024-06-23 DIAGNOSIS — O24410 Gestational diabetes mellitus in pregnancy, diet controlled: Secondary | ICD-10-CM

## 2024-06-23 DIAGNOSIS — K8020 Calculus of gallbladder without cholecystitis without obstruction: Secondary | ICD-10-CM | POA: Insufficient documentation

## 2024-06-23 NOTE — Progress Notes (Addendum)
   PRENATAL VISIT NOTE  Subjective:  Erica Hall is a 28 y.o. [phone] at [redacted]w[redacted]d being seen today for ongoing prenatal care.  She is currently monitored for the following issues for this high-risk pregnancy and has History of molar pregnancy; Supervision of high risk pregnancy, antepartum; Gestational diabetes; and Gallstones on their problem list.  Patient reports sharp lower abdominal pain for about a week now. Worse while standing, lasts 1-2 hours at a time. Yesterday saw floaters while at work, did not check BP. Felt like she was going to faint.  Contractions: Irritability. Vag. Bleeding: None.  Movement: Present. Denies leaking of fluid.   The following portions of the patient's history were reviewed and updated as appropriate: allergies, current medications, past family history, past medical history, past social history, past surgical history and problem list.   Objective:    Vitals:   06/23/24 0914  BP: 111/76  Pulse: (!) 101  Weight: 182 lb 14.4 oz (83 kg)    Fetal Status:  Fetal Heart Rate (bpm): 148 Fundal Height: 31 cm Movement: Present    General: Alert, oriented and cooperative. Patient is in no acute distress.  Skin: Skin is warm and dry. No rash noted.   Cardiovascular: Normal heart rate noted  Respiratory: Normal respiratory effort, no problems with respiration noted  Abdomen: Soft, gravid, appropriate for gestational age.  Pain/Pressure: Present     Pelvic: Cervical exam deferred        Extremities: Normal range of motion.  Edema: None  Mental Status: Normal mood and affect. Normal behavior. Normal judgment and thought content.   Assessment and Plan:  Pregnancy: G4P1021 at [redacted]w[redacted]d 1. Supervision of high risk pregnancy, antepartum (Primary) Prenatal course reviewed BP, HR, FHR within normal limits Feeling regular FM  CBC, CMP, urine protein/creatinine ratio to rule out preeclampsia due to vision changes, though low suspicion based on BP  2. Diet controlled  gestational diabetes mellitus (GDM) in third trimester Current regimen: diet and glucose checks CBG review: majority of glucose readings at goal, after dinner 2 hour has a few more elevated readings Regimen changes: none Growth US : 5/15 growth within normal limits, 68%ile, Normal AFI and AC Antenatal monitoring: Begin antenatal testing at 32 weeks  3. History of molar pregnancy Placental to pathology at delivery  4. [redacted] weeks gestation of pregnancy Fundal height appropriate for gestational age.  Wants Tdap today.  Preterm labor symptoms and general obstetric precautions including but not limited to vaginal bleeding, contractions, leaking of fluid and fetal movement were reviewed in detail with the patient. Please refer to After Visit Summary for other counseling recommendations.   Return in about 4 weeks (around 07/21/2024) for LOB.  Future Appointments  Date Time Provider Department Center  07/25/2024  8:55 AM Fredirick Glenys RAMAN, MD [facility]  07/28/2024  8:00 AM [facility] PROVIDER 1 [facility]  07/28/2024  8:30 AM [facility] US2 [facility]    Joesph DELENA Sear, PA

## 2024-06-23 NOTE — Patient Instructions (Addendum)
 BRAXTON HICKS: Braxton Hicks contractions can happen for many weeks before real labor begins. These "practice" contractions can be very painful and can make you think you are in labor when you are not. You might notice them more at the end of the day. Usually, Braxton Hicks contractions are less regular and not as strong as "true" labor. Time your contractions and note whether they continue when you are resting and drinking water after you empty your bladder. If rest and hydration make the contractions go away, they are not true labor contractions.  Below is a summary of some differences between true labor and false labor. But sometimes the only way to tell the difference is by having a vaginal exam to find changes in your cervix that signal the start of labor.  Timing and frequency of contractions: True labor contractions come at regular intervals. They have a pattern. As time goes on, they get closer together. Each lasts about 60 or 90 seconds. Braxton Hicks contractions do not have a pattern and they do not get closer together. These are called Braxton Hicks contractions.  Change with movement: True labor contractions continue even when you rest or move around. Braxton Hicks contractions may stop when you walk or rest. They also may stop with a change of position.  Strength of contractions: True labor contractions steadily get stronger. Braxton Hicks contractions are weak and do not get much stronger. They may start strong and then weaken.  Location of pain: Pain from true labor contractions usually starts in the back and moves to the front. Pain from [facility] contractions usually is felt only in the front.  What to try to tell the difference: Empty your bladder. Drink 16 ounces of fluid. Rest if you were active when contractions started OR walk around if you were resting when contractions started. Start timing your contractions. Note how long each contraction lasts, and how far  apart different contractions are. If contractions are getting stronger and closer together with time, they are more likely labor contractions.   ROUND LIGAMENT PAIN: Pain Relief For mild pain, use over-the-counter acetaminophen  (Tylenol ). Make sure to follow the instructions on the bottle.  Exercise and Stretching Gentle exercises like walking, swimming, or water aerobics can ease the pain. Prenatal yoga is another great option. It focuses on gentle stretching and breathing exercises, which can help relieve tension in your ligaments. Always choose prenatal yoga classes to ensure the poses are safe. Hip flexing is another useful exercise. Bending and flexing your hip joints throughout the day can reduce the strain on your round ligaments and make sudden movements less painful. Avoid Sudden Movements Abrupt movements can trigger round ligament pain. Try to move slowly and change positions gradually. For example, when getting up from a sitting position, do it slowly to avoid sudden stretching of the ligaments. Modify Activity Level If certain activities trigger your pain, try to avoid them. Resting and taking breaks throughout the day can significantly reduce discomfort. Use a Belly Band Wearing a support band can provide extra support to your growing belly. This can help take some pressure off your round ligaments and reduce pain.  (Copied from [facility]) What is Symphysis Pubis Dysfunction (SPD)? SPD describes pain in the symphysis pubis joint at the front of the pelvic girdle. The discomfort is often felt right over the pubic bone at the front, below your tummy, around the sides of your hips or in your lower back. You may experience pain  in all or some of the areas shaded in the diagrams below.This leaflet will help you understand more about it, how you can adapt your lifestyle and how you can look after yourself during and after your pregnancy and the labour  process. SPD is common. The sooner it is identified and assessed the better it can be managed. About one in five pregnant women experience mild discomfort in the back or especially the front of the pelvis during pregnancy. If you have any symptoms that do not improve within a couple of days or interfere with your normal day-to-day life, you may have SPD and should ask for help from your midwife, GP or physiotherapist. Women experience different symptoms and these are more severe in some women than others. If you understand how SPD may be caused, what treatment is available, and how you can help yourself, this may speed up your recovery, reducing the impact of SPD on your life.  How is SPD diagnosed? The diagnosis of SPD is based on certain signs and symptoms which you may experience during your pregnancy or afterwards. Having one or more of them may indicate the need for a physiotherapy assessment followed by advice on appropriate management. You may also have: difficulty with walking pain when standing on one leg, e.g. climbing stairs, dressing or getting in and out of the bath pain and / or difficulty moving your legs apart, e.g. getting in and out of a car clicking or grinding in the pelvic area - you may hear or feel this limited or painful hip movements e.g. turning in bed difficulty with lying in some positions, e.g. on your side pain during normal activities of daily life pain and difficulty during sexual intercourse.  With SPD the degree of discomfort you feel may vary from being intermittent and irritating to being very wearing and upsetting.  What causes SPD? Sometimes there is no obvious explanation for the cause of SPD. Usually it is a combination of factors, including: change in the activity of the muscles in your stomach, pelvis, hip and pelvic floor, which can lead to the pelvic girdle becoming less stable; a previous fall, accident or weakness that has damaged your pelvis or hips;  hormones released during pregnancy can lead to increased looseness in all ligaments and muscles throughout the body, destabilising joints; occasionally, the position of the baby may produce symptoms related to SPD. Susceptibility is increased if: you have had previous injury to your pelvis, you have had SPD in a previous pregnancy, you have a hard physical job or workload, you have increased body weight and body mass index before and / or by the end of the pregnancy.  How many women get SPD? This condition is common; about one in five women will have some pelvic pain. There is a wide range of symptoms and in some women it is worse than in others, but having some symptoms does not mean you are automatically going to get worse. If you get the right treatment early during pregnancy, it can usually be managed well in some cases the symptoms will go completely. However, in a small percentage of women, SPD may persist longer after birth, particularly if left untreated.  Management You will need general advice to help you selfmanage your condition (see overleaf) and you may need one or more of the following referrals:  your GP or midwife can refer you to the Physiotherapy Department for assessment of your pelvic joints, followed by treatment (as necessary) and advice on  how to manage your condition  Physiotherapy treatment Physiotherapy aims to improve your spinal and pelvic joint position and stability, relieve pain and improve muscle function. Treatment may include: manual therapy to ensure your spinal, pelvic and hip joints are moving correctly exercises to stretch out tighter tissue and to help strengthen and improve stability of your stomach, back, pelvis and hips advice including: back care, lifting advice, suggested positions for labour and birth, looking after your baby or other toddlers, positions for sexual intercourse other types of pain relief, e.g. TENS, ice / heat exercises in water provision  of equipment such as pelvic support belts, crutches or wheelchairs. Your physiotherapist will see you during your pregnancy, as necessary. You may need several visits to control your pain and Information for patients and visitors improve your stability. If the pain persists treatment can continue after you have had your baby.   Exercise during pregnancy Take moderate exercise, but do not start new sporting activities Don't indulge in intensive or extensive periods of exercise Avoid high impact exercise such as running, racket sports and aerobics Swimming may be of benefit, but avoid breast stroke and leg kicks Walk with shorter strides than usual  Generally avoid any activity which increases your pelvic girdle pain  Tips during pregnancy Be as active as possible within pain limits    Avoid activities that make the pain worse Ask for and accept help with household chores and involve your partner, family and friends Rest when you can - you may need to rest and sit down more often Sit down to get dressed and undressed Avoid standing on one leg Wear flat, supportive shoes Avoid standing to do tasks such as ironing Try to keep your knees together when moving in and out of the car - a plastic bag on the seat may help you swivel Sleep in a comfortable position, e.g. Lie on your side with a pillow between your legs Try different ways of turning in bed, e.g. turning under or over with your knees together and squeeze your buttocks Roll in and out of bed keeping your knees together Take the stairs one at a time (go upstairs leading with your less painful leg and downstairs with the more painful one, or go upstairs backwards, or on your bottom) Plan your day - bring everything you need downstairs in the morning and have everything to hand Consider alternative positions if you desire sexual intercourse, e.g. lying on your side or kneeling on all fours  Activities to avoid which will make the pain  worse Standing on one leg Bending and twisting to lift or carry a toddler or baby on one hip Crossing your legs Sitting on the floor Sitting or standing for long periods Lifting heavy weights (shopping bags, wet washing, vacuum cleaners, toddlers) Vacuuming Pushing heavy objects like supermarket trolleys or pushchairs, especially uphill Carrying anything in only one hand  After you have had your baby You should move about as much as possible after the baby, within the limits of your pain.  Be aware, medication to relieve pain may cover up the discomfort of your SPD, so be Information for patients and visitors careful and continue to avoid the aggravating activities, as you did before you had your baby.  Most women's SPD symptoms disappear in the week following the birth. If you still have symptoms 10-14 days after the birth, you should be referred to a physiotherapist for assessment and receive treatment from your midwife or GP. Remember, SPD is  common and treatable. The sooner it is identified and assessed, the better it can be managed.

## 2024-06-23 NOTE — Progress Notes (Signed)
 Pt reports that she's been having a lot of sharpe lower abdominal pain for about  a week now. Also yesterday she was seeing Floaters & finger tips were cold but did not check BP.

## 2024-06-24 ENCOUNTER — Ambulatory Visit: Payer: Self-pay | Admitting: Family Medicine

## 2024-06-24 LAB — CBC
Hematocrit: 37.8 % (ref 34.0–46.6)
Hemoglobin: 12.3 g/dL (ref 11.1–15.9)
MCH: 30.1 pg (ref 26.6–33.0)
MCHC: 32.5 g/dL (ref 31.5–35.7)
MCV: 93 fL (ref 79–97)
Platelets: 310 x10E3/uL (ref 150–450)
RBC: 4.08 x10E6/uL (ref 3.77–5.28)
RDW: 12.2 % (ref 11.7–15.4)
WBC: 10.3 x10E3/uL (ref 3.4–10.8)

## 2024-06-24 LAB — COMPREHENSIVE METABOLIC PANEL WITH GFR
ALT: 8 IU/L (ref 0–32)
AST: 16 IU/L (ref 0–40)
Albumin: 3.4 g/dL — ABNORMAL LOW (ref 4.0–5.0)
Alkaline Phosphatase: 163 IU/L — ABNORMAL HIGH (ref 44–121)
BUN/Creatinine Ratio: 15 (ref 9–23)
BUN: 9 mg/dL (ref 6–20)
Bilirubin Total: 0.6 mg/dL (ref 0.0–1.2)
CO2: 17 mmol/L — ABNORMAL LOW (ref 20–29)
Calcium: 8.6 mg/dL — ABNORMAL LOW (ref 8.7–10.2)
Chloride: 106 mmol/L (ref 96–106)
Creatinine, Ser: 0.6 mg/dL (ref 0.57–1.00)
Globulin, Total: 2.5 g/dL (ref 1.5–4.5)
Glucose: 82 mg/dL (ref 70–99)
Potassium: 4.6 mmol/L (ref 3.5–5.2)
Sodium: 138 mmol/L (ref 134–144)
Total Protein: 5.9 g/dL — ABNORMAL LOW (ref 6.0–8.5)
eGFR: 126 mL/min/1.73 (ref >59)

## 2024-06-25 LAB — PROTEIN / CREATININE RATIO, URINE
Creatinine, Urine: 194.7 mg/dL
Protein, Ur: 32.3 mg/dL
Protein/Creat Ratio: 166 mg/g{creat} (ref 0–200)

## 2024-07-16 ENCOUNTER — Ambulatory Visit
Admission: RE | Admit: 2024-07-16 | Discharge: 2024-07-16 | Disposition: A | Discharge status: Home / Self Care | Payer: Self-pay | Attending: Physician Assistant

## 2024-07-16 ENCOUNTER — Other Ambulatory Visit: Payer: Self-pay

## 2024-07-16 VITALS — BP 113/76 | HR 82 | Temp 98.0°F | Resp 16 | Ht 61.0 in | Wt 183.0 lb

## 2024-07-16 DIAGNOSIS — H9202 Otalgia, left ear: Secondary | ICD-10-CM

## 2024-07-16 DIAGNOSIS — J029 Acute pharyngitis, unspecified: Secondary | ICD-10-CM

## 2024-07-16 LAB — POCT RAPID STREP A (OFFICE): Rapid Strep A Screen: NEGATIVE

## 2024-07-16 LAB — POC COVID19/FLU A&B COMBO
Covid Antigen, POC: NEGATIVE
Influenza A Antigen, POC: NEGATIVE
Influenza B Antigen, POC: NEGATIVE

## 2024-07-16 NOTE — ED Triage Notes (Addendum)
 Pt presents with complaints of sore throat and left ear pain. Sore throat began about 4 days ago and left ear pain started yesterday, 7/25. Denies fevers at home. Currently rates overall pain a 4/10. Pt also mentions she feels the urge to cough, so dry in my throat. Denies taking OTC medications for pain/symptoms reported. [redacted] weeks pregnant. Headaches noted as well.

## 2024-07-16 NOTE — ED Provider Notes (Signed)
 [facility]    CSN: [id number] Arrival date & time: 07/16/24  1429      History   Chief Complaint Chief Complaint  Patient presents with   Sore Throat    Sore throat, left ear pain - Entered by patient   Otalgia    HPI Erica Hall is a 28 y.o. female.   HPI  Pt reports having sore throat for a few days and then developed ear pain yesterday She denies fever, chills, rhinorrhea, nausea, vomiting, diarrhea, rashes  She reports that she has had some nasal congestion and coughing. 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 Authorizing Provider  aspirin  EC 81 MG tablet Take 1 tablet (81 mg total) by mouth at bedtime. Start taking daily for the rest of pregnancy for prevention of preeclampsia 04/22/24    Anyanwu, Gloris LABOR, MD  Prenatal Vit-Fe Fumarate-FA (PRENATAL PLUS VITAMIN/MINERAL) 27-1 MG TABS Take 1 tablet by mouth daily. 12/25/23   Warren-Hill, Camie LABOR, CNM  prochlorperazine  (COMPAZINE ) 10 MG tablet Take 1 tablet (10 mg total) by mouth every 6 (six) hours as needed (headache). 03/25/24   Cleatus Moccasin, MD    Family History Family History  Problem Relation Age of Onset   Diabetes Mother        pre-diabetic   Hypertension Father    Hyperlipidemia Father    Hypertension Paternal Aunt    Hypertension Paternal Uncle    Diabetes Maternal Grandmother    Hypertension Paternal Grandmother    Diabetes Paternal Grandmother    Heart disease Paternal Grandfather    Diabetes Paternal Grandfather    Asthma Neg Hx    Cancer Neg Hx    Stroke Neg Hx     Social History Social History   Tobacco Use   Smoking status: Never   Smokeless tobacco: Never  Vaping Use   Vaping status: Never Used  Substance Use Topics   Alcohol use: Not Currently   Drug use: No  Allergies   Patient has no known allergies.   Review of Systems Review of Systems  Constitutional:  Negative for chills and fever.  HENT:  Positive for congestion, ear pain and sore throat. Negative for rhinorrhea.   Respiratory:  Positive for cough. Negative for shortness of breath and wheezing.   Gastrointestinal:  Negative for diarrhea, nausea and vomiting.  Skin:  Negative for rash.     Physical Exam Triage Vital Signs ED Triage Vitals  Encounter Vitals Group     BP 07/16/24 1436 113/76     Girls Systolic BP Percentile --      Girls Diastolic BP Percentile --      Boys Systolic BP Percentile --      Boys Diastolic BP Percentile --      Pulse Rate 07/16/24 1436 82     Resp 07/16/24 1436 16     Temp 07/16/24 1436 98 F (36.7 C)     Temp Source 07/16/24 1436 Oral     SpO2 07/16/24 1436 97 %     Weight 07/16/24 1453 182 lb 15.7 oz (83 kg)     Height 07/16/24 1436 5' 1 (1.549 m)     Head Circumference --       Peak Flow --      Pain Score 07/16/24 1453 4     Pain Loc --      Pain Education --      Exclude from Growth Chart --    No data found.  Updated Vital Signs BP 113/76 (BP Location: Right Arm)   Pulse 82   Temp 98 F (36.7 C) (Oral)   Resp 16   Ht 5' 1 (1.549 m)   Wt 182 lb 15.7 oz (83 kg)   LMP 11/19/2023   SpO2 97%   Breastfeeding No   BMI 34.57 kg/m   Visual Acuity Right Eye Distance:   Left Eye Distance:   Bilateral Distance:    Right Eye Near:   Left Eye Near:    Bilateral Near:     Physical Exam Vitals reviewed.  Constitutional:      General: She is awake.     Appearance: Normal appearance. She is well-developed and well-groomed.  HENT:     Head: Normocephalic and atraumatic.     Right Ear: Hearing, tympanic membrane and ear canal normal.     Left Ear: Hearing, tympanic membrane and ear canal normal.     Mouth/Throat:     Lips: Pink.     Mouth: Mucous membranes are moist.     Pharynx: Oropharynx is clear. Uvula midline. No pharyngeal swelling, oropharyngeal exudate, posterior oropharyngeal erythema, uvula swelling or postnasal drip.     Tonsils: No tonsillar exudate or tonsillar abscesses.  Cardiovascular:     Rate and Rhythm: Normal rate and regular rhythm.     Pulses: Normal pulses.          Radial pulses are 2+ on the right side and 2+ on the left side.     Heart sounds: Normal heart sounds. No murmur heard.    No friction rub. No gallop.  Pulmonary:     Effort: Pulmonary effort is normal.     Breath sounds: Normal breath sounds. No decreased air movement. No decreased breath sounds, wheezing, rhonchi or rales.  Musculoskeletal:     Cervical back: Normal range of motion and neck supple.  Lymphadenopathy:     Head:     Right side of head: No submental, submandibular  or preauricular adenopathy.     Left side of head: No submental, submandibular or preauricular adenopathy.     Cervical:     Right cervical: No superficial cervical adenopathy.    Left  cervical: No superficial cervical adenopathy.     Upper Body:     Right upper body: No supraclavicular adenopathy.     Left upper body: No supraclavicular adenopathy.  Skin:    General: Skin is warm and dry.  Neurological:     Mental Status: She is alert.  Psychiatric:        Mood and Affect: Mood normal.        Behavior: Behavior normal. Behavior is cooperative.      UC Treatments / Results  Labs (all labs ordered are listed, but only abnormal results are displayed) Labs Reviewed  POC COVID19/FLU A&B COMBO - Normal  POCT RAPID STREP A (OFFICE) - Normal    EKG   Radiology No results found.  Procedures Procedures (including critical care time)  Medications Ordered in UC Medications - No data to display  Initial Impression / Assessment and Plan / UC Course  I have reviewed the triage vital signs and the nursing notes.  Pertinent labs & imaging results that were available during my care of the patient were reviewed by me and considered in my medical decision making (see chart for details).      Final Clinical Impressions(s) / UC Diagnoses   Final diagnoses:  Sore throat  Ear pain, left  Patient presents today with concerns for sore throat that has been ongoing for several days followed by development of ear pain yesterday.  She denies fever, chills, rhinorrhea, nausea vomiting or diarrhea.  Rapid flu, COVID, strep test were negative.  Physical exam is negative for signs of pharyngeal erythema, swelling or exudates.  No evidence of otitis media or externa.  Potential viral etiology or even allergies.  Recommend over-the-counter medications as needed for symptomatic relief-reviewed those that are safe to use in pregnancy.  Follow-up as needed for progressing or persistent symptoms   Discharge Instructions      You were seen today for sore throat and ear pain. At this time I suspect your symptoms are likely due to eustachian tube dysfunction.  To help with this I  recommend the following:  Warm liquids- tea with honey Salt water gargles Tylenol  Flonase nasal spray An antihistamine such as Claritin or Zyrtec Mucinex  Your strep, COVID and flu testing were negative If your symptoms are not improving or seem to be worsening you can return to urgent care or follow up with your PCP.      ED Prescriptions   None    PDMP not reviewed this encounter.   Marylene Rocky BRAVO, PA-C 07/16/24 1534

## 2024-07-16 NOTE — Discharge Instructions (Signed)
 You were seen today for sore throat and ear pain. At this time I suspect your symptoms are likely due to eustachian tube dysfunction.  To help with this I recommend the following:  Warm liquids- tea with honey Salt water gargles Tylenol  Flonase nasal spray An antihistamine such as Claritin or Zyrtec Mucinex  Your strep, COVID and flu testing were negative If your symptoms are not improving or seem to be worsening you can return to urgent care or follow up with your PCP.

## 2024-07-25 ENCOUNTER — Encounter: Payer: Self-pay | Admitting: Family Medicine

## 2024-07-28 ENCOUNTER — Ambulatory Visit: Payer: Self-pay | Admitting: Obstetrics and Gynecology

## 2024-07-28 ENCOUNTER — Ambulatory Visit: Payer: Self-pay | Attending: Obstetrics and Gynecology | Admitting: Maternal & Fetal Medicine

## 2024-07-28 ENCOUNTER — Ambulatory Visit: Payer: Self-pay

## 2024-07-28 ENCOUNTER — Other Ambulatory Visit: Payer: Self-pay

## 2024-07-28 ENCOUNTER — Other Ambulatory Visit (HOSPITAL_COMMUNITY)
Admission: RE | Admit: 2024-07-28 | Discharge: 2024-07-28 | Disposition: A | Discharge status: Home / Self Care | Payer: Self-pay | Source: Ambulatory Visit | Attending: Obstetrics and Gynecology | Admitting: Obstetrics and Gynecology

## 2024-07-28 VITALS — BP 121/67 | HR 75

## 2024-07-28 VITALS — BP 121/67 | HR 75 | Wt 194.6 lb

## 2024-07-28 DIAGNOSIS — Z3A36 36 weeks gestation of pregnancy: Secondary | ICD-10-CM | POA: Insufficient documentation

## 2024-07-28 DIAGNOSIS — O0990 Supervision of high risk pregnancy, unspecified, unspecified trimester: Secondary | ICD-10-CM | POA: Diagnosis present

## 2024-07-28 DIAGNOSIS — Z8759 Personal history of other complications of pregnancy, childbirth and the puerperium: Secondary | ICD-10-CM

## 2024-07-28 DIAGNOSIS — O09293 Supervision of pregnancy with other poor reproductive or obstetric history, third trimester: Secondary | ICD-10-CM

## 2024-07-28 DIAGNOSIS — O24419 Gestational diabetes mellitus in pregnancy, unspecified control: Secondary | ICD-10-CM

## 2024-07-28 DIAGNOSIS — O24410 Gestational diabetes mellitus in pregnancy, diet controlled: Secondary | ICD-10-CM

## 2024-07-28 DIAGNOSIS — O99213 Obesity complicating pregnancy, third trimester: Secondary | ICD-10-CM | POA: Insufficient documentation

## 2024-07-28 DIAGNOSIS — E669 Obesity, unspecified: Secondary | ICD-10-CM

## 2024-07-28 DIAGNOSIS — O99212 Obesity complicating pregnancy, second trimester: Secondary | ICD-10-CM

## 2024-07-28 DIAGNOSIS — Z362 Encounter for other antenatal screening follow-up: Secondary | ICD-10-CM | POA: Insufficient documentation

## 2024-07-28 DIAGNOSIS — O0993 Supervision of high risk pregnancy, unspecified, third trimester: Secondary | ICD-10-CM

## 2024-07-28 NOTE — Progress Notes (Signed)
   PRENATAL VISIT NOTE  Subjective:  Erica Hall is a 28 y.o. [phone] at [redacted]w[redacted]d being seen today for ongoing prenatal care.  She is currently monitored for the following issues for this low-risk pregnancy and has History of molar pregnancy; Supervision of high risk pregnancy, antepartum; Gestational diabetes; and Gallstones on their problem list.  Patient reports increased swelling in feet, but resolves with rest. No other concerns  . Vag. Bleeding: None.  Movement: Present. Denies leaking of fluid.   The following portions of the patient's history were reviewed and updated as appropriate: allergies, current medications, past family history, past medical history, past social history, past surgical history and problem list.   Objective:    Vitals:   07/28/24 1021  BP: 121/67  Pulse: 75  Weight: 194 lb 9.6 oz (88.3 kg)    Fetal Status:  Fetal Heart Rate (bpm): 137 Fundal Height: 37 cm Movement: Present    General: Alert, oriented and cooperative. Patient is in no acute distress.  Skin: Skin is warm and dry. No rash noted.   Cardiovascular: Normal heart rate noted  Respiratory: Normal respiratory effort, no problems with respiration noted  Abdomen: Soft, gravid, appropriate for gestational age.  Pain/Pressure: Absent     Pelvic: Cervical exam deferred        Extremities: Normal range of motion.  Edema: Trace  Mental Status: Normal mood and affect. Normal behavior. Normal judgment and thought content.   Assessment and Plan:  Pregnancy: G4P1021 at [redacted]w[redacted]d 1. Supervision of high risk pregnancy, antepartum (Primary) BP and and FHR normal today.  2. [redacted] weeks gestation of pregnancy GBS, GC/CT swabs collected today  Follow up 1 week for ROB.   3. Diet controlled gestational diabetes mellitus (GDM) in third trimester US  today. Fetal weight 70th%. Recommended induction of labor between 39-40 weeks. Prefers induction to be scheduled at 40 weeks 08/25/2024.  See glucose  log.      4. History of molar pregnancy Hx of Molar in 2019  Term labor symptoms and general obstetric precautions including but not limited to vaginal bleeding, contractions, leaking of fluid and fetal movement were reviewed in detail with the patient. Please refer to After Visit Summary for other counseling recommendations.   Return in about 1 week (around 08/04/2024) for ROB.  No future appointments.  Derrek JINNY Freund, NP Student

## 2024-07-28 NOTE — Progress Notes (Signed)
   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 G4P1021 at [redacted]w[redacted]d here for ultrasound and consultation. Erica Hall is doing well today with no acute concerns. Today we focused on the following:   GDM: Well controlled with diet. EFW is at the [facility]. Her previous fetus was 6lb. Due to gestational diabetes, induction should occur between 53 and 40 weeks.  The patient had time to ask questions that were answered to her satisfaction.  She verbalized understanding and agrees to proceed with the plan below.  Sonographic findings Single intrauterine pregnancy at 36w 0d.  Fetal cardiac activity:  Observed and appears normal. Presentation: Cephalic. Interval fetal anatomy appears normal. Fetal biometry shows the estimated fetal weight at the 70 percentile. Amniotic fluid volume: Within normal limits. MVP: 3.25 cm. Placenta: Anterior.  There are limitations of prenatal ultrasound such as the inability to detect certain abnormalities due to poor visualization. Various factors such as fetal position, gestational age and maternal body habitus may increase the difficulty in visualizing the fetal anatomy.    Recommendations -Due to gestational diabetes, induction should occur between 48 and 40 weeks.   Review of Systems: A review of systems was performed and was negative except per HPI   Vitals and Physical Exam    07/28/2024    8:25 AM 07/16/2024    2:53 PM 07/16/2024    2:36 PM  Vitals with BMI  Height  5' 1 5' 1  Weight  183 lbs   BMI  34.59   Systolic  121  113  Diastolic 67  76  Pulse 75  82    Sitting comfortably on the sonogram table Nonlabored breathing Normal rate and rhythm Abdomen is nontender  Past pregnancies OB History  Gravida Para Term Preterm AB Living  4 1 1  0 2 1  SAB IAB Ectopic Multiple Live Births  1 0 0 0 1    # Outcome Date GA Lbr Len/2nd Weight Sex Type Anes PTL Lv  4 Current           3 SAB 08/2022          2 Term 12/18/20 [redacted]w[redacted]d 06:56 / 00:31 6 lb (2.722 kg) F Vag-Spont EPI  LIV     Complications: Gestational diabetes  1 Molar 12/30/17 [redacted]w[redacted]d           Obstetric Comments  1st preg was a molar preg     I spent 30 minutes reviewing the patients chart, including labs and images as well as counseling the patient about her medical conditions. Greater than 50% of the time was spent in direct face-to-face patient counseling.  Delora Smaller  MFM, [facility]   07/28/2024  8:50 AM

## 2024-07-29 LAB — GC/CHLAMYDIA PROBE AMP (~~LOC~~) NOT AT ARMC
Chlamydia: NEGATIVE
Comment: NEGATIVE
Comment: NORMAL
Neisseria Gonorrhea: NEGATIVE

## 2024-07-31 ENCOUNTER — Ambulatory Visit: Payer: Self-pay | Admitting: Obstetrics and Gynecology

## 2024-07-31 DIAGNOSIS — O99820 Streptococcus B carrier state complicating pregnancy: Secondary | ICD-10-CM | POA: Insufficient documentation

## 2024-07-31 LAB — CULTURE, BETA STREP (GROUP B ONLY): Strep Gp B Culture: POSITIVE — AB

## 2024-08-04 ENCOUNTER — Encounter: Payer: Self-pay | Admitting: Family Medicine

## 2024-08-09 ENCOUNTER — Other Ambulatory Visit: Payer: Self-pay

## 2024-08-09 ENCOUNTER — Encounter: Payer: Self-pay | Admitting: Obstetrics and Gynecology

## 2024-08-09 ENCOUNTER — Ambulatory Visit (INDEPENDENT_AMBULATORY_CARE_PROVIDER_SITE_OTHER): Payer: Self-pay | Admitting: Obstetrics and Gynecology

## 2024-08-09 VITALS — BP 126/85 | HR 87 | Wt 196.9 lb

## 2024-08-09 DIAGNOSIS — Z3A37 37 weeks gestation of pregnancy: Secondary | ICD-10-CM

## 2024-08-09 DIAGNOSIS — R609 Edema, unspecified: Secondary | ICD-10-CM

## 2024-08-09 DIAGNOSIS — O0990 Supervision of high risk pregnancy, unspecified, unspecified trimester: Secondary | ICD-10-CM

## 2024-08-09 DIAGNOSIS — Z8759 Personal history of other complications of pregnancy, childbirth and the puerperium: Secondary | ICD-10-CM

## 2024-08-09 DIAGNOSIS — O24410 Gestational diabetes mellitus in pregnancy, diet controlled: Secondary | ICD-10-CM

## 2024-08-09 DIAGNOSIS — O0993 Supervision of high risk pregnancy, unspecified, third trimester: Secondary | ICD-10-CM

## 2024-08-09 DIAGNOSIS — O99820 Streptococcus B carrier state complicating pregnancy: Secondary | ICD-10-CM

## 2024-08-09 NOTE — Progress Notes (Signed)
   PRENATAL VISIT NOTE  Subjective:  Erica Hall is a 28 y.o. [phone] at [redacted]w[redacted]d being seen today for ongoing prenatal care.  She is currently monitored for the following issues for this high-risk pregnancy and has History of molar pregnancy; Supervision of high risk pregnancy, antepartum; Gestational diabetes; Gallstones; and Group B Streptococcus carrier, +RV culture, currently pregnant on their problem list.  Patient reports occasional floaters in vision, has been going on for a while during the pregnancy, swelling in feet.  Contractions: Irritability. Vag. Bleeding: None.  Movement: Present. Denies leaking of fluid.   The following portions of the patient's history were reviewed and updated as appropriate: allergies, current medications, past family history, past medical history, past social history, past surgical history and problem list.   Objective:    Vitals:   08/09/24 1100  BP: 126/85  Pulse: 87  Weight: 196 lb 14.4 oz (89.3 kg)    Fetal Status:  Fetal Heart Rate (bpm): 148   Movement: Present    General: Alert, oriented and cooperative. Patient is in no acute distress.  Skin: Skin is warm and dry. No rash noted.   Cardiovascular: Normal heart rate noted  Respiratory: Normal respiratory effort, no problems with respiration noted  Abdomen: Soft, gravid, appropriate for gestational age.  Pain/Pressure: Present (pelvic pressure)     Pelvic: Cervical exam deferred        Extremities: Normal range of motion.  Edema: Mild pitting, slight indentation +1 pitting edema to high ankle bilaterally  Mental Status: Normal mood and affect. Normal behavior. Normal judgment and thought content.   Assessment and Plan:  Pregnancy: G4P1021 at [redacted]w[redacted]d  1. Supervision of high risk pregnancy, antepartum (Primary) IOL scheduled for 08/25/24 Last growth 70%tile  2. History of molar pregnancy Placenta to path  3. Group B Streptococcus carrier, +RV culture, currently pregnant Ppx in  labor  4. Diet controlled gestational diabetes mellitus (GDM) in third trimester Diet controlled Forgot log but reports mostly within range, states occasionally post dinner is 125, mornings are slightly higher but in 90s  5. [redacted] weeks gestation of pregnancy Pt has birth plan, felt pressured with epidural and may not want it this time Would prefer no students  6. Swelling Will get labs today   Term labor symptoms and general obstetric precautions including but not limited to vaginal bleeding, contractions, leaking of fluid and fetal movement were reviewed in detail with the patient. Please refer to After Visit Summary for other counseling recommendations.   Return in about 1 week (around 08/16/2024) for high OB.  Future Appointments  Date Time Provider Department Center  08/25/2024  6:30 AM [facility] SCHED ROOM [facility] None    Burnard CHRISTELLA Moats, MD

## 2024-08-10 LAB — COMPREHENSIVE METABOLIC PANEL WITH GFR
ALT: 8 IU/L (ref 0–32)
AST: 15 IU/L (ref 0–40)
Albumin: 3.2 g/dL — ABNORMAL LOW (ref 4.0–5.0)
Alkaline Phosphatase: 248 IU/L — ABNORMAL HIGH (ref 44–121)
BUN/Creatinine Ratio: 13 (ref 9–23)
BUN: 8 mg/dL (ref 6–20)
Bilirubin Total: 0.5 mg/dL (ref 0.0–1.2)
CO2: 18 mmol/L — ABNORMAL LOW (ref 20–29)
Calcium: 8.6 mg/dL — ABNORMAL LOW (ref 8.7–10.2)
Chloride: 106 mmol/L (ref 96–106)
Creatinine, Ser: 0.6 mg/dL (ref 0.57–1.00)
Globulin, Total: 2.3 g/dL (ref 1.5–4.5)
Glucose: 71 mg/dL (ref 70–99)
Potassium: 4.4 mmol/L (ref 3.5–5.2)
Sodium: 137 mmol/L (ref 134–144)
Total Protein: 5.5 g/dL — ABNORMAL LOW (ref 6.0–8.5)
eGFR: 126 mL/min/1.73 (ref >59)

## 2024-08-10 LAB — CBC
Hematocrit: 36.3 % (ref 34.0–46.6)
Hemoglobin: 11.5 g/dL (ref 11.1–15.9)
MCH: 28.1 pg (ref 26.6–33.0)
MCHC: 31.7 g/dL (ref 31.5–35.7)
MCV: 89 fL (ref 79–97)
Platelets: 315 x10E3/uL (ref 150–450)
RBC: 4.09 x10E6/uL (ref 3.77–5.28)
RDW: 12.9 % (ref 11.7–15.4)
WBC: 7.4 x10E3/uL (ref 3.4–10.8)

## 2024-08-11 ENCOUNTER — Ambulatory Visit: Payer: Self-pay | Admitting: Obstetrics and Gynecology

## 2024-08-18 ENCOUNTER — Inpatient Hospital Stay (HOSPITAL_COMMUNITY)
Admission: AD | Admit: 2024-08-18 | Discharge: 2024-08-20 | DRG: 807 | Disposition: A | Discharge status: Home / Self Care | Payer: MEDICAID | Attending: Obstetrics & Gynecology | Admitting: Obstetrics & Gynecology

## 2024-08-18 ENCOUNTER — Other Ambulatory Visit: Payer: Self-pay

## 2024-08-18 ENCOUNTER — Encounter (HOSPITAL_COMMUNITY): Payer: Self-pay | Admitting: Obstetrics and Gynecology

## 2024-08-18 ENCOUNTER — Ambulatory Visit (INDEPENDENT_AMBULATORY_CARE_PROVIDER_SITE_OTHER): Payer: Self-pay | Admitting: Family Medicine

## 2024-08-18 VITALS — BP 123/85 | HR 86 | Wt 198.0 lb

## 2024-08-18 DIAGNOSIS — Z3A39 39 weeks gestation of pregnancy: Secondary | ICD-10-CM

## 2024-08-18 DIAGNOSIS — O24420 Gestational diabetes mellitus in childbirth, diet controlled: Principal | ICD-10-CM | POA: Diagnosis present

## 2024-08-18 DIAGNOSIS — O0993 Supervision of high risk pregnancy, unspecified, third trimester: Secondary | ICD-10-CM | POA: Diagnosis not present

## 2024-08-18 DIAGNOSIS — O99820 Streptococcus B carrier state complicating pregnancy: Secondary | ICD-10-CM

## 2024-08-18 DIAGNOSIS — O99824 Streptococcus B carrier state complicating childbirth: Secondary | ICD-10-CM | POA: Diagnosis present

## 2024-08-18 DIAGNOSIS — Z8759 Personal history of other complications of pregnancy, childbirth and the puerperium: Secondary | ICD-10-CM | POA: Diagnosis not present

## 2024-08-18 DIAGNOSIS — O0990 Supervision of high risk pregnancy, unspecified, unspecified trimester: Secondary | ICD-10-CM

## 2024-08-18 DIAGNOSIS — O24419 Gestational diabetes mellitus in pregnancy, unspecified control: Secondary | ICD-10-CM | POA: Diagnosis present

## 2024-08-18 DIAGNOSIS — Z8249 Family history of ischemic heart disease and other diseases of the circulatory system: Secondary | ICD-10-CM

## 2024-08-18 DIAGNOSIS — O26893 Other specified pregnancy related conditions, third trimester: Secondary | ICD-10-CM | POA: Diagnosis present

## 2024-08-18 DIAGNOSIS — O24410 Gestational diabetes mellitus in pregnancy, diet controlled: Secondary | ICD-10-CM

## 2024-08-18 DIAGNOSIS — O4202 Full-term premature rupture of membranes, onset of labor within 24 hours of rupture: Secondary | ICD-10-CM | POA: Diagnosis not present

## 2024-08-18 DIAGNOSIS — Z833 Family history of diabetes mellitus: Secondary | ICD-10-CM | POA: Diagnosis not present

## 2024-08-18 DIAGNOSIS — K8020 Calculus of gallbladder without cholecystitis without obstruction: Secondary | ICD-10-CM

## 2024-08-18 LAB — TYPE AND SCREEN
ABO/RH(D): A POS
Antibody Screen: NEGATIVE

## 2024-08-18 LAB — CBC
HCT: 34.4 % — ABNORMAL LOW (ref 36.0–46.0)
Hemoglobin: 11 g/dL — ABNORMAL LOW (ref 12.0–15.0)
MCH: 27.9 pg (ref 26.0–34.0)
MCHC: 32 g/dL (ref 30.0–36.0)
MCV: 87.3 fL (ref 80.0–100.0)
Platelets: 284 K/uL (ref 150–400)
RBC: 3.94 MIL/uL (ref 3.87–5.11)
RDW: 14.2 % (ref 11.5–15.5)
WBC: 8.1 K/uL (ref 4.0–10.5)
nRBC: 0 % (ref 0.0–0.2)

## 2024-08-18 LAB — POCT FERN TEST
POCT Fern Test: NEGATIVE
POCT Fern Test: POSITIVE

## 2024-08-18 LAB — GLUCOSE, CAPILLARY: Glucose-Capillary: 76 mg/dL (ref 70–99)

## 2024-08-18 MED ORDER — FENTANYL CITRATE (PF) 100 MCG/2ML IJ SOLN: 100.0000 ug | INTRAMUSCULAR | Status: DC | PRN

## 2024-08-18 MED ORDER — OXYTOCIN-SODIUM CHLORIDE 30-0.9 UT/500ML-% IV SOLN
2.5000 [IU]/h | INTRAVENOUS | Status: DC
  Filled 2024-08-18: qty 500

## 2024-08-18 MED ORDER — ONDANSETRON HCL 4 MG/2ML IJ SOLN: 4.0000 mg | Freq: Four times a day (QID) | INTRAMUSCULAR | Status: DC | PRN

## 2024-08-18 MED ORDER — PENICILLIN G POT IN DEXTROSE 60000 UNIT/ML IV SOLN
3.0000 10*6.[IU] | INTRAVENOUS | Status: DC
  Administered 2024-08-19 (×2): 3 10*6.[IU] via INTRAVENOUS
  Filled 2024-08-18 (×2): qty 50

## 2024-08-18 MED ORDER — LACTATED RINGERS IV SOLN: 500.0000 mL | INTRAVENOUS | Status: DC | PRN

## 2024-08-18 MED ORDER — OXYTOCIN BOLUS FROM INFUSION
333.0000 mL | Freq: Once | INTRAVENOUS | Status: AC
  Administered 2024-08-19: 333 mL via INTRAVENOUS

## 2024-08-18 MED ORDER — SOD CITRATE-CITRIC ACID 500-334 MG/5ML PO SOLN: 30.0000 mL | ORAL | Status: DC | PRN

## 2024-08-18 MED ORDER — LIDOCAINE HCL (PF) 1 % IJ SOLN: 30.0000 mL | INTRAMUSCULAR | Status: DC | PRN

## 2024-08-18 MED ORDER — LACTATED RINGERS IV SOLN: INTRAVENOUS | Status: DC

## 2024-08-18 MED ORDER — SODIUM CHLORIDE 0.9 % IV SOLN
5.0000 10*6.[IU] | Freq: Once | INTRAVENOUS | Status: AC
  Administered 2024-08-18: 5 10*6.[IU] via INTRAVENOUS
  Filled 2024-08-18: qty 5

## 2024-08-18 MED ORDER — ACETAMINOPHEN 325 MG PO TABS: 650.0000 mg | ORAL_TABLET | ORAL | Status: DC | PRN

## 2024-08-18 NOTE — MAU Note (Signed)

## 2024-08-18 NOTE — MAU Note (Signed)
 Erica Hall is a 28 y.o. at [redacted]w[redacted]d here in MAU reporting: she had a gush of fluid at approximately 1730 this evening, reports fluid was yellowish.  Also states she's having irregular ctxs, ctxs are 20-30 minutes apart.  Denies VB.  Endorses +FM.  LMP: 11/19/2023 Onset of complaint: today Pain score: 4 Vitals:   08/18/24 1900  BP: 127/83  Pulse: 79  Resp: 18  Temp: 98.2 F (36.8 C)  SpO2: 100%     FHT: 124 bpm  Lab orders placed from triage: None

## 2024-08-18 NOTE — Progress Notes (Signed)
   Subjective:  Erica Hall is a 28 y.o. [phone] at [redacted]w[redacted]d being seen today for ongoing prenatal care.  She is currently monitored for the following issues for this high-risk pregnancy and has History of molar pregnancy; Supervision of high risk pregnancy, antepartum; Gestational diabetes; Gallstones; and Group B Streptococcus carrier, +RV culture, currently pregnant on their problem list.  Patient reports no complaints.  Contractions: Not present. Vag. Bleeding: None.  Movement: Present. Denies leaking of fluid.   The following portions of the patient's history were reviewed and updated as appropriate: allergies, current medications, past family history, past medical history, past social history, past surgical history and problem list. Problem list updated.  Objective:   Vitals:   08/18/24 0838  BP: 123/85  Pulse: 86  Weight: 198 lb (89.8 kg)    Fetal Status: Fetal Heart Rate (bpm): 140   Movement: Present     General:  Alert, oriented and cooperative. Patient is in no acute distress.  Skin: Skin is warm and dry. No rash noted.   Cardiovascular: Normal heart rate noted  Respiratory: Normal respiratory effort, no problems with respiration noted  Abdomen: Soft, gravid, appropriate for gestational age. Pain/Pressure: Present     Pelvic: Vag. Bleeding: None     Cervical exam deferred        Extremities: Normal range of motion.  Edema: Mild pitting, slight indentation  Mental Status: Normal mood and affect. Normal behavior. Normal judgment and thought content.   Urinalysis:      Assessment and Plan:  Pregnancy: G4P1021 at [redacted]w[redacted]d  1. Supervision of high risk pregnancy, antepartum (Primary) BP and FHR normal Still deciding on contraception  2. Diet controlled gestational diabetes mellitus (GDM) in third trimester Forgot log, on recall fastings have been a little higher than usual in the 90's, and dinners are sometimes in the 120's, otherwise at goal Last growth US  07/28/2024,  EFW 70%, 2999g, AC 79% Scheduled for IOL 08/25/2024, discussed that IOL recommendations revolve around reducing risk of stillbirth and that we should have something closer to perfect sugar control to wait until the farther end of recommended IOL range Natural labor is very important to her Discussed would recommend going on the sooner side for IOL but ultimately her choice, if she changes her mind she can message us   3. History of molar pregnancy Placenta to path after delivery  4. Group B Streptococcus carrier, +RV culture, currently pregnant Ppx in labor  Term labor symptoms and general obstetric precautions including but not limited to vaginal bleeding, contractions, leaking of fluid and fetal movement were reviewed in detail with the patient. Please refer to After Visit Summary for other counseling recommendations.  Return in 7 weeks (on 10/06/2024) for PP check.   Lola Donnice HERO, MD

## 2024-08-18 NOTE — H&P (Incomplete)
 OBSTETRIC ADMISSION HISTORY AND PHYSICAL  Erica Hall is a 28 y.o. female [phone] with IUP at [redacted]w[redacted]d by LMP presenting for SROM/SOL. She reports +FMs, No LOF, no VB, no blurry vision, headaches or peripheral edema, and RUQ pain.  She plans on breast feeding. She is undecided on birth control. She received her prenatal care at [facility]   Dating: By LMP c/w 8 wk US --->  Estimated Date of Delivery: 08/25/24  Sono:    @[redacted]w[redacted]d , CWD, normal anatomy, cephalic presentation, anterior placental lie, 2999g, 70% EFW   Prenatal History/Complications: hx molar pregnancy, GDMA1   Past Medical History: Past Medical History:  Diagnosis Date   GERD (gastroesophageal reflux disease)    occasional -diet controlled, no meds   Gestational diabetes    with first preg   Headache    Molar pregnancy 2019    Past Surgical History: Past Surgical History:  Procedure Laterality Date   DILATION AND EVACUATION N/A 12/30/2017   Procedure: DILATATION AND EVACUATION;  Surgeon: Nicholaus Burnard HERO, MD;  Location: [facility];  Service: Gynecology;  Laterality: N/A;   WISDOM TOOTH EXTRACTION      Obstetrical History: OB History     Gravida  4   Para  1   Term  1   Preterm  0   AB  2   Living  1      SAB  1   IAB  0   Ectopic  0   Multiple  0   Live Births  1        Obstetric Comments  1st preg was a molar preg       Term delivery in 2021, 2722 g   Social History Social History   Socioeconomic History   Marital status: Single    Spouse name: Not on file   Number of children: Not on file   Years of education: Not on file   Highest education level: Some college, no degree  Occupational History   Not on file  Tobacco Use   Smoking status: Never   Smokeless tobacco: Never  Vaping Use   Vaping status: Never Used  Substance and Sexual Activity   Alcohol use: Not Currently   Drug use: No   Sexual activity: Yes    Birth control/protection: None    Comment: approx [redacted] wks gestation -  1st pregnancy  Other Topics Concern   Not on file  Social History Narrative   Not on file   Social Drivers of Health   Financial Resource Strain: Low Risk  (07/28/2024)   Overall Financial Resource Strain (CARDIA)    Difficulty of Paying Living Expenses: Not hard at all  Food Insecurity: No Food Insecurity (07/28/2024)   Hunger Vital Sign    Worried About Running Out of Food in the Last Year: Never true    Ran Out of Food in the Last Year: Never true  Transportation Needs: No Transportation Needs (07/28/2024)   PRAPARE - Administrator, Civil Service (Medical): No    Lack of Transportation (Non-Medical): No  Physical Activity: Insufficiently Active (07/28/2024)   Exercise Vital Sign    Days of Exercise per Week: 2 days    Minutes of Exercise per Session: 10 min  Stress: No Stress Concern Present (07/28/2024)   [employer] of Occupational Health - Occupational Stress Questionnaire    Feeling of Stress: Not at all  Social Connections: Moderately Integrated (07/28/2024)   Social Connection and Isolation Panel  Frequency of Communication with Friends and Family: More than three times a week    Frequency of Social Gatherings with Friends and Family: More than three times a week    Attends Religious Services: 1 to 4 times per year    Active Member of [employer] or Organizations: No    Attends Engineer, structural: Not on file    Marital Status: Living with partner    Family History: Family History  Problem Relation Age of Onset   Diabetes Mother        pre-diabetic   Hypertension Father    Hyperlipidemia Father    Hypertension Paternal Aunt    Hypertension Paternal Uncle    Diabetes Maternal Grandmother    Hypertension Paternal Grandmother    Diabetes Paternal Grandmother    Heart disease Paternal Grandfather    Diabetes Paternal Grandfather    Asthma Neg Hx    Cancer Neg Hx    Stroke Neg Hx     Allergies: No Known Allergies  Medications Prior to  Admission  Medication Sig Dispense Refill Last Dose/Taking   aspirin  EC 81 MG tablet Take 1 tablet (81 mg total) by mouth at bedtime. Start taking daily for the rest of pregnancy for prevention of preeclampsia 300 tablet 2 08/17/2024   Prenatal Vit-Fe Fumarate-FA (PRENATAL PLUS VITAMIN/MINERAL) 27-1 MG TABS Take 1 tablet by mouth daily. 30 tablet 11 08/17/2024   prochlorperazine  (COMPAZINE ) 10 MG tablet Take 1 tablet (10 mg total) by mouth every 6 (six) hours as needed (headache). 30 tablet 1      Review of Systems   All systems reviewed and negative except as stated in HPI  Blood pressure 125/77, pulse 78, temperature 98.2 F (36.8 C), temperature source Oral, resp. rate 18, height 5' (1.524 m), weight 90.4 kg, last menstrual period 11/19/2023, SpO2 100%. General appearance: alert, cooperative, appears stated age, and mild distress Lungs: clear to auscultation bilaterally Heart: regular rate and rhythm Abdomen: soft, non-tender; bowel sounds normal Extremities: Homans sign is negative, no sign of DVT Presentation: cephalic Fetal monitoring Baseline: 130 bpm, Variability: Good {> 6 bpm), Accelerations: Reactive, and Decelerations: Absent Uterine activityFrequency: Every 10 minutes     Prenatal labs: ABO, Rh: --/--/PENDING (08/28 2004) Antibody: PENDING (08/28 2004) Rubella: 1.35 (03/04 1513) RPR: Non Reactive (06/02 0930)  HBsAg: Negative (03/04 1513)  HIV: Non Reactive (06/02 0930)  GBS: Positive/-- (08/07 1653)    Lab Results  Component Value Date   GBS Positive (A) 07/28/2024   GTT +GMD Genetic screening  NIPS LR F; Horizon NRx4 Anatomy US  complete  Immunization History  Administered Date(s) Administered   Influenza,inj,Quad PF,6+ Mos 09/10/2020   Influenza-Unspecified 10/08/2018, 09/22/2019   PFIZER(Purple Top)SARS-COV-2 Vaccination 02/10/2020, 03/02/2020   Tdap 09/10/2020, 06/23/2024    Prenatal Transfer Tool  Maternal Diabetes: Yes:  Diabetes Type:  Diet  controlled Genetic Screening: Normal Maternal Ultrasounds/Referrals: Normal Fetal Ultrasounds or other Referrals:  Referred to Materal Fetal Medicine  Maternal Substance Abuse:  No Significant Maternal Medications:  Meds include: Other: aspirin , compazine  Significant Maternal Lab Results: Group B Strep positive Number of Prenatal Visits:greater than 3 verified prenatal visits Maternal Vaccinations:TDap Other Comments:  history of molar pregnancy; placenta to path after delivery   Results for orders placed or performed during the hospital encounter of 08/18/24 (from the past 24 hours)  POCT fern test   Collection Time: 08/18/24  7:25 PM  Result Value Ref Range   POCT Fern Test Negative = intact amniotic membranes  Fern Test   Collection Time: 08/18/24  7:43 PM  Result Value Ref Range   POCT Fern Test Positive = ruptured amniotic membanes   Glucose, capillary   Collection Time: 08/18/24  8:01 PM  Result Value Ref Range   Glucose-Capillary 76 70 - 99 mg/dL  CBC   Collection Time: 08/18/24  8:04 PM  Result Value Ref Range   WBC 8.1 4.0 - 10.5 K/uL   RBC 3.94 3.87 - 5.11 MIL/uL   Hemoglobin 11.0 (L) 12.0 - 15.0 g/dL   HCT 65.5 (L) 63.9 - 53.9 %   MCV 87.3 80.0 - 100.0 fL   MCH 27.9 26.0 - 34.0 pg   MCHC 32.0 30.0 - 36.0 g/dL   RDW 85.7 88.4 - 84.4 %   Platelets 284 150 - 400 K/uL   nRBC 0.0 0.0 - 0.2 %  Type and screen   Collection Time: 08/18/24  8:04 PM  Result Value Ref Range   ABO/RH(D) PENDING    Antibody Screen PENDING    Sample Expiration      08/21/2024,2359 Performed at [facility], [address] [place], [place] [address]     Patient Active Problem List   Diagnosis Date Noted   Gestational diabetes mellitus, antepartum 08/18/2024   Group B Streptococcus carrier, +RV culture, currently pregnant 07/31/2024   Gallstones 06/23/2024   Gestational diabetes 02/23/2024   Supervision of high risk pregnancy, antepartum 01/27/2024   History of molar  pregnancy 09/10/2020    Assessment/Plan:  Chelsi Warr is a 28 y.o. [id number] at [redacted]w[redacted]d here for SROM/SOL  #Labor: Expectant management at this time #Pain: Per pt; hopign to avoid epidural #FWB: Category 1 #GBS status:  positive #Feeding: Breastmilk  #Reproductive Life planning: Undecided #Circ:  not applicable  Coolidge Moros, MD  08/18/2024, 8:40 PM

## 2024-08-18 NOTE — Patient Instructions (Signed)

## 2024-08-19 ENCOUNTER — Encounter (HOSPITAL_COMMUNITY): Payer: Self-pay | Admitting: Obstetrics & Gynecology

## 2024-08-19 DIAGNOSIS — O24420 Gestational diabetes mellitus in childbirth, diet controlled: Secondary | ICD-10-CM

## 2024-08-19 DIAGNOSIS — O99820 Streptococcus B carrier state complicating pregnancy: Secondary | ICD-10-CM

## 2024-08-19 DIAGNOSIS — Z3A39 39 weeks gestation of pregnancy: Secondary | ICD-10-CM

## 2024-08-19 DIAGNOSIS — O4202 Full-term premature rupture of membranes, onset of labor within 24 hours of rupture: Secondary | ICD-10-CM

## 2024-08-19 LAB — GLUCOSE, CAPILLARY
Glucose-Capillary: 78 mg/dL (ref 70–99)
Glucose-Capillary: 82 mg/dL (ref 70–99)

## 2024-08-19 LAB — RPR: RPR Ser Ql: NONREACTIVE

## 2024-08-19 MED ORDER — ONDANSETRON HCL 4 MG PO TABS: 4.0000 mg | ORAL_TABLET | ORAL | Status: DC | PRN

## 2024-08-19 MED ORDER — TERBUTALINE SULFATE 1 MG/ML IJ SOLN: 0.2500 mg | Freq: Once | INTRAMUSCULAR | Status: DC | PRN

## 2024-08-19 MED ORDER — COCONUT OIL OIL
1.0000 | TOPICAL_OIL | Status: DC | PRN
Start: 2024-08-19 — End: 2024-08-20

## 2024-08-19 MED ORDER — WITCH HAZEL-GLYCERIN EX PADS: 1.0000 | MEDICATED_PAD | CUTANEOUS | Status: DC | PRN

## 2024-08-19 MED ORDER — PRENATAL MULTIVITAMIN CH
1.0000 | ORAL_TABLET | Freq: Every day | ORAL | Status: DC
  Administered 2024-08-19 – 2024-08-20 (×2): 1 via ORAL
  Filled 2024-08-19 (×2): qty 1

## 2024-08-19 MED ORDER — ZOLPIDEM TARTRATE 5 MG PO TABS: 5.0000 mg | ORAL_TABLET | Freq: Every evening | ORAL | Status: DC | PRN

## 2024-08-19 MED ORDER — ACETAMINOPHEN 325 MG PO TABS: 650.0000 mg | ORAL_TABLET | ORAL | Status: DC | PRN

## 2024-08-19 MED ORDER — SENNOSIDES-DOCUSATE SODIUM 8.6-50 MG PO TABS
2.0000 | ORAL_TABLET | Freq: Every day | ORAL | Status: DC
  Filled 2024-08-19: qty 2

## 2024-08-19 MED ORDER — SIMETHICONE 80 MG PO CHEW: 80.0000 mg | CHEWABLE_TABLET | ORAL | Status: DC | PRN

## 2024-08-19 MED ORDER — DIPHENHYDRAMINE HCL 25 MG PO CAPS: 25.0000 mg | ORAL_CAPSULE | Freq: Four times a day (QID) | ORAL | Status: DC | PRN

## 2024-08-19 MED ORDER — ONDANSETRON HCL 4 MG/2ML IJ SOLN: 4.0000 mg | INTRAMUSCULAR | Status: DC | PRN

## 2024-08-19 MED ORDER — OXYTOCIN-SODIUM CHLORIDE 30-0.9 UT/500ML-% IV SOLN
1.0000 m[IU]/min | INTRAVENOUS | Status: DC
  Administered 2024-08-19: 2 m[IU]/min via INTRAVENOUS

## 2024-08-19 MED ORDER — BENZOCAINE-MENTHOL 20-0.5 % EX AERO: 1.0000 | INHALATION_SPRAY | CUTANEOUS | Status: DC | PRN

## 2024-08-19 MED ORDER — IBUPROFEN 600 MG PO TABS
600.0000 mg | ORAL_TABLET | Freq: Four times a day (QID) | ORAL | Status: DC
Start: 2024-08-19 — End: 2024-08-20
  Administered 2024-08-19 – 2024-08-20 (×5): 600 mg via ORAL
  Filled 2024-08-19 (×5): qty 1

## 2024-08-19 MED ORDER — DIBUCAINE (PERIANAL) 1 % EX OINT: 1.0000 | TOPICAL_OINTMENT | CUTANEOUS | Status: DC | PRN

## 2024-08-19 NOTE — Progress Notes (Signed)
 Blood sugar 78.  VSS.  States ctx are increasing in intensity, q 5 minutes. FHR Cat 1.  Continue expectant mg.t

## 2024-08-19 NOTE — Progress Notes (Addendum)
 Labor Progress Note Erica Hall is a 28 y.o. [phone] at [redacted]w[redacted]d presented for SROM 1730 8/28 S: Patient feels the contractions are stronger, but not keeping track of frequency  O:  BP 129/76   Pulse 81   Temp 98.1 F (36.7 C) (Oral)   Resp 18   Ht 5' (1.524 m)   Wt 90.4 kg   LMP 11/19/2023   SpO2 100%   BMI 38.90 kg/m  EFM: 125 baseline/moderate variability/+accels, no decels  CVE: Deferred at this time given SROM for 12 hours US : cephalic  A&P: 28 y.o. G4P1021 [redacted]w[redacted]d SROM now 12 hours. #Labor: Contractions remain spaced to q8-10 mins, will start with pitocin  2x2 #Pain: per pt request #FWB: category 1 #GBS positive   Coolidge Moros, MD 5:41 AM

## 2024-08-19 NOTE — Lactation Note (Signed)
 This note was copied from a baby's chart. Lactation Consultation Note  Patient Name: Erica Hall Unijb'd Date: 08/19/2024 Age:27 hours Reason for consult: Initial assessment;Term;Maternal endocrine disorder  P2- [name] plans on offering breast, EBM and formula. [name] reports that infant latched well for her first feeding, but she has recently starting spitting up fluid and becoming sleepy. LC reviewed how this is normal due the the first 24 hr birthday nap. LC also reviewed the spitting up fluid for the first few days is common. [name] reported feeling relieved. LC asked if we could latch infant together, but [name] declined because infant had recently eaten. LC encouraged [name] to call for a latch assessment tonight. [name] denied having any questions or concerns at this time.   LC reviewed the first 24 hr birthday nap, day 2 cluster feeding, feeding infant on cue 8-12x in 24 hrs, not allowing infant to go over 3 hrs without a feeding, CDC milk storage guidelines, LC services handout and engorgement/breast care. LC encouraged [name] to call for further assistance as needed.  Maternal Data Has patient been taught Hand Expression?: No Does the patient have breastfeeding experience prior to this delivery?: Yes How long did the patient breastfeed?: 2.5 years  Feeding Mother's Current Feeding Choice: Breast Milk and Formula  Lactation Tools Discussed/Used Tools: Pump Breast pump type: Manual Pump Education: Setup, frequency, and cleaning;Milk Storage Reason for Pumping: [name] request Pumping frequency: 15-20 min every 3 hrs  Interventions Interventions: Breast feeding basics reviewed;Hand pump;Education;LC Services brochure  Discharge Discharge Education: Engorgement and breast care;Warning signs for feeding baby Pump: Manual WIC Program: Yes  Consult Status Consult Status: Follow-up Date: 08/20/24 Follow-up type: In-patient    Recardo Hoit BS, IBCLC 08/19/2024, 12:20 PM

## 2024-08-19 NOTE — Discharge Summary (Signed)
 Postpartum Discharge Summary     Patient Name: Erica Hall DOB: [birth year] MRN: [id number]  Date of admission: 08/18/2024 Delivery date:08/19/2024 Delivering provider: LONA MERRITTS Date of discharge: 08/20/2024  Admitting diagnosis: Gestational diabetes mellitus, antepartum [O24.419] Intrauterine pregnancy: [redacted]w[redacted]d     Secondary diagnosis:  Principal Problem:   Gestational diabetes mellitus, antepartum Active Problems:   Vaginal delivery  Additional problems: None    Discharge diagnosis: Term Pregnancy Delivered and GDM A1                                              Post partum procedures:None Augmentation: Pitocin  Complications: None  Hospital course: Onset of Labor With Vaginal Delivery      28 y.o. yo [id number] at [redacted]w[redacted]d was admitted in Latent Labor on 08/18/2024. Labor course was complicated by nothing  Membrane Rupture Time/Date: 5:30 PM,08/18/2024  Delivery Method:Vaginal, Spontaneous Operative Delivery:N/A Episiotomy: None Lacerations:  1st degree Patient had a postpartum course uncomplicated.  She is ambulating, tolerating a regular diet, passing flatus, and urinating well. Patient is discharged home in stable condition on 08/20/24.  Newborn Data: Birth date:08/19/2024 Birth time:7:27 AM Gender:Female Living status:Living Apgars:9 ,9  Weight:   Magnesium Sulfate received: No BMZ received: No Rhophylac:N/A MMR:N/A T-DaP:Given prenatally Flu: N/A RSV Vaccine received: No Transfusion:No  Immunizations received: Immunization History  Administered Date(s) Administered   Influenza,inj,Quad PF,6+ Mos 09/10/2020   Influenza-Unspecified 10/08/2018, 09/22/2019   PFIZER(Purple Top)SARS-COV-2 Vaccination 02/10/2020, 03/02/2020   Tdap 09/10/2020, 06/23/2024    Physical exam  Vitals:   08/19/24 1444 08/19/24 1830 08/19/24 2203 08/20/24 0542  BP: 122/72 126/66 126/72 130/85  Pulse: 70 73 83 73  Resp: 18 16 16 16   Temp: 98 F (36.7 C) 98.2 F (36.8  C) 98 F (36.7 C) 98.1 F (36.7 C)  TempSrc: Oral Oral Oral Oral  SpO2: 100% 100% 100% 100%  Weight:      Height:       General: alert, cooperative, and no distress Lochia: appropriate Uterine Fundus: firm Incision: N/A DVT Evaluation: No evidence of DVT seen on physical exam. Calf/Ankle edema is present Labs: Lab Results  Component Value Date   WBC 8.1 08/18/2024   HGB 11.0 (L) 08/18/2024   HCT 34.4 (L) 08/18/2024   MCV 87.3 08/18/2024   PLT 284 08/18/2024      Latest Ref Rng & Units 08/09/2024   11:49 AM  CMP  Glucose 70 - 99 mg/dL 71   BUN 6 - 20 mg/dL 8   Creatinine 9.42 - 8.99 mg/dL 9.39   Sodium 865 - 855 mmol/L 137   Potassium 3.5 - 5.2 mmol/L 4.4   Chloride 96 - 106 mmol/L 106   CO2 20 - 29 mmol/L 18   Calcium 8.7 - 10.2 mg/dL 8.6   Total Protein 6.0 - 8.5 g/dL 5.5   Total Bilirubin 0.0 - 1.2 mg/dL 0.5   Alkaline Phos 44 - 121 IU/L 248   AST 0 - 40 IU/L 15   ALT 0 - 32 IU/L 8    Edinburgh Score:    01/18/2021    9:06 AM  Edinburgh Postnatal Depression Scale Screening Tool  I have been able to laugh and see the funny side of things. 0  I have looked forward with enjoyment to things. 0  I have blamed myself unnecessarily when things went wrong. 1  I have been anxious or worried for no good reason. 0  I have felt scared or panicky for no good reason. 0  Things have been getting on top of me. 1  I have been so unhappy that I have had difficulty sleeping. 0  I have felt sad or miserable. 0  I have been so unhappy that I have been crying. 0  The thought of harming myself has occurred to me. 0  Edinburgh Postnatal Depression Scale Total 2      Data saved with a previous flowsheet row definition   No data recorded  After visit meds:  Allergies as of 08/20/2024   No Known Allergies      Medication List     STOP taking these medications    aspirin  EC 81 MG tablet   prochlorperazine  10 MG tablet Commonly known as: COMPAZINE        TAKE these  medications    ibuprofen  600 MG tablet Commonly known as: ADVIL  Take 1 tablet (600 mg total) by mouth every 6 (six) hours.   Prenatal Plus Vitamin/Mineral 27-1 MG Tabs Take 1 tablet by mouth daily.         Discharge home in stable condition Infant Feeding: Bottle and Breast Infant Disposition:home with mother Discharge instruction: per After Visit Summary and Postpartum booklet. Activity: Advance as tolerated. Pelvic rest for 6 weeks.  Diet: routine diet Future Appointments: Future Appointments  Date Time Provider Department Center  08/25/2024  6:30 AM [facility] SCHED ROOM [facility] None  10/06/2024 10:35 AM Anyanwu, Gloris LABOR, MD [facility]   Follow up Visit:   Please schedule this patient for a In person postpartum visit in 4 weeks with the following provider: Any provider. Additional Postpartum F/U: 2hr GTT (but pt is self pay so may need to do different f/u testing) Low risk pregnancy complicated by: GDM Delivery mode:  Vaginal, Spontaneous Anticipated Birth Control:  Unsure   08/19/2024 Cathlean Ely, CNM  Harlene LITTIE Duncans MSN, CNM Advanced Practice Provider, Center for [facility]  08/20/2024 9:31 AM

## 2024-08-20 DIAGNOSIS — O24419 Gestational diabetes mellitus in pregnancy, unspecified control: Secondary | ICD-10-CM | POA: Diagnosis present

## 2024-08-20 LAB — CBC
HCT: 31.3 % — ABNORMAL LOW (ref 36.0–46.0)
Hemoglobin: 9.8 g/dL — ABNORMAL LOW (ref 12.0–15.0)
MCH: 27.5 pg (ref 26.0–34.0)
MCHC: 31.3 g/dL (ref 30.0–36.0)
MCV: 87.9 fL (ref 80.0–100.0)
Platelets: 245 K/uL (ref 150–400)
RBC: 3.56 MIL/uL — ABNORMAL LOW (ref 3.87–5.11)
RDW: 14.4 % (ref 11.5–15.5)
WBC: 8.1 K/uL (ref 4.0–10.5)
nRBC: 0 % (ref 0.0–0.2)

## 2024-08-20 MED ORDER — IBUPROFEN 600 MG PO TABS: 600.0000 mg | ORAL_TABLET | Freq: Four times a day (QID) | ORAL | 0 refills | Status: DC

## 2024-08-20 NOTE — Progress Notes (Signed)
 Patient ID: Erica Hall, female   DOB: 27-Oct-1996, 27 y.o.   MRN: [id number]  Post Partum Day One:S/P Vaginal Delivery   Subjective: Patient up ad lib, denies syncope or dizziness. Reports consuming regular diet without issues and denies N/V. Denies issues with urination and reports bleeding is better.  Patient is breastfeeding and reports going okay.  Desires method for postpartum contraception, but will consider options.  Pain is being appropriately managed with use of ibuprofen .  Objective: Vitals:   08/19/24 1444 08/19/24 1830 08/19/24 2203 08/20/24 0542  BP: 122/72 126/66 126/72 130/85  Pulse: 70 73 83 73  Resp: 18 16 16 16   Temp: 98 F (36.7 C) 98.2 F (36.8 C) 98 F (36.7 C) 98.1 F (36.7 C)  TempSrc: Oral Oral Oral Oral  SpO2: 100% 100% 100% 100%  Weight:      Height:       Recent Labs    08/18/24 2004 08/20/24 0512  HGB 11.0* 9.8*  HCT 34.4* 31.3*    Physical Exam:  General: alert, cooperative, and no distress Mood/Affect: Appropriate/ Congruent Lungs: clear to auscultation, no wheezes, rales or rhonchi, symmetric air entry.  Heart: normal rate and regular rhythm. Breast: not examined. Abdomen:  + bowel sounds, Soft Uterine Fundus: firm, U/ Lochia: appropriate Laceration: None Skin: Warm, Dry DVT Evaluation: Calf/Ankle edema is present.  Assessment S/P Vaginal Delivery-Day One Normal [facility] BreastFeeding  Plan: -Reviewed discharge planning including return precautions and follow up. -Discussed need to repeat GTT at [facility] on 10/06/2024. -Rx for ibuprofen  to be sent to pharmacy. -Plan for discharge today as long as infant is eligible.  -Information for contraception placed in AVS.  -Continue current care until that time. -L&D team to be updated on patient status.   Harlene LITTIE Duncans, MSN, CNM 08/20/2024, 7:14 AM

## 2024-08-25 ENCOUNTER — Inpatient Hospital Stay (HOSPITAL_COMMUNITY): Payer: Self-pay

## 2024-08-25 DIAGNOSIS — Z0289 Encounter for other administrative examinations: Secondary | ICD-10-CM

## 2024-08-25 LAB — SURGICAL PATHOLOGY

## 2024-08-26 ENCOUNTER — Encounter: Payer: Self-pay | Admitting: Family Medicine

## 2024-08-30 ENCOUNTER — Telehealth (HOSPITAL_COMMUNITY): Payer: Self-pay | Admitting: *Deleted

## 2024-08-30 NOTE — Telephone Encounter (Signed)
 08/30/2024  Name: Erica Hall MRN: [id number] DOB: 10/30/1996  Reason for Call:  Transition of Care Hospital Discharge Call  Contact Status: Patient Contact Status: Complete  Language assistant needed:          Follow-Up Questions: Do You Have Any Concerns About Your Health As You Heal From Delivery?: No Do You Have Any Concerns About Your Infants Health?: No  Edinburgh Postnatal Depression Scale:  In the Past 7 Days: I have been able to laugh and see the funny side of things.: As much as I always could I have looked forward with enjoyment to things.: As much as I ever did I have blamed myself unnecessarily when things went wrong.: Yes, some of the time I have been anxious or worried for no good reason.: No, not at all I have felt scared or panicky for no good reason.: No, not at all Things have been getting on top of me.: No, most of the time I have coped quite well I have been so unhappy that I have had difficulty sleeping.: Not at all I have felt sad or miserable.: No, not at all I have been so unhappy that I have been crying.: No, never The thought of harming myself has occurred to me.: Never [place] Postnatal Depression Scale Total: 3  PHQ2-9 Depression Scale:     Discharge Follow-up: Edinburgh score requires follow up?: No Patient was advised of the following resources:: Breastfeeding Support Group, Support Group Requested email information - sent by RN Post-discharge interventions: Reviewed Newborn Safe Sleep Practices  Signature Allean IVAR Carton, RN, 08/30/24, [phone]

## 2024-09-01 ENCOUNTER — Encounter: Payer: Self-pay | Admitting: Obstetrics and Gynecology

## 2024-09-26 ENCOUNTER — Ambulatory Visit: Payer: Self-pay | Admitting: Advanced Practice Midwife

## 2024-10-03 ENCOUNTER — Other Ambulatory Visit: Payer: Self-pay

## 2024-10-03 DIAGNOSIS — O24439 Gestational diabetes mellitus in the puerperium, unspecified control: Secondary | ICD-10-CM

## 2024-10-06 ENCOUNTER — Other Ambulatory Visit: Payer: Self-pay

## 2024-10-06 ENCOUNTER — Encounter: Payer: Self-pay | Admitting: Obstetrics & Gynecology

## 2024-10-06 ENCOUNTER — Ambulatory Visit (INDEPENDENT_AMBULATORY_CARE_PROVIDER_SITE_OTHER): Payer: Self-pay | Admitting: Obstetrics & Gynecology

## 2024-10-06 DIAGNOSIS — M5440 Lumbago with sciatica, unspecified side: Secondary | ICD-10-CM

## 2024-10-06 DIAGNOSIS — O24439 Gestational diabetes mellitus in the puerperium, unspecified control: Secondary | ICD-10-CM

## 2024-10-06 DIAGNOSIS — Z8632 Personal history of gestational diabetes: Secondary | ICD-10-CM

## 2024-10-06 MED ORDER — IBUPROFEN 800 MG PO TABS: 800.0000 mg | ORAL_TABLET | Freq: Three times a day (TID) | ORAL | 3 refills | Status: AC | PRN

## 2024-10-06 MED ORDER — CYCLOBENZAPRINE HCL 10 MG PO TABS: 10.0000 mg | ORAL_TABLET | Freq: Three times a day (TID) | ORAL | 1 refills | Status: AC | PRN

## 2024-10-06 NOTE — Progress Notes (Signed)
 Post Partum Visit Note  Erica Hall is a 28 y.o. [phone] female who presents for a postpartum visit. She is 6 weeks postpartum following a normal spontaneous vaginal delivery.  I have fully reviewed the prenatal and intrapartum course, had A1GDM. The delivery was at [redacted]w[redacted]d gestational weeks.  Anesthesia: none. Postpartum course has been uncomplicated. Baby is doing well. Baby is feeding by breast. Bleeding no bleeding. Bowel function is normal. Bladder function is normal. Patient is not sexually active. Contraception method is none. Postpartum depression screening: negative.  Patient reports debilitating lower back pain radiating down to her buttocks and this make sit very hard to get up and ambulate. Pain started after delivery, and has worsened. Not alleviated by Ibuprofen  or Tylenol .  The pain make sit hard to take care of her children.  She also is worried about her breastfeeding and not producing enough milk, wants Advertising copywriter.  The pregnancy intention screening data noted above was reviewed. Potential methods of contraception were discussed. The patient elected to proceed with condoms.   Edinburgh Postnatal Depression Scale - 10/06/24 1014       Edinburgh Postnatal Depression Scale:  In the Past 7 Days   I have been able to laugh and see the funny side of things. 0    I have looked forward with enjoyment to things. 0    I have blamed myself unnecessarily when things went wrong. 1    I have been anxious or worried for no good reason. 1    I have felt scared or panicky for no good reason. 0    Things have been getting on top of me. 2    I have been so unhappy that I have had difficulty sleeping. 0    I have felt sad or miserable. 0    I have been so unhappy that I have been crying. 0    The thought of harming myself has occurred to me. 0    Edinburgh Postnatal Depression Scale Total 4          Health Maintenance Due  Topic Date Due   Pneumococcal Vaccine (1  of 2 - PCV) Never done   Hepatitis B Vaccines 19-59 Average Risk (1 of 3 - 19+ 3-dose series) Never done   HPV VACCINES (1 - 3-dose SCDM series) Never done   Influenza Vaccine  07/22/2024   COVID-19 Vaccine (3 - 2025-26 season) 08/22/2024    The following portions of the patient's history were reviewed and updated as appropriate: allergies, current medications, past family history, past medical history, past social history, past surgical history, and problem list.  Review of Systems Pertinent items noted in HPI and remainder of comprehensive ROS otherwise negative.  Objective:  BP 117/80   Pulse 67   Wt 173 lb (78.5 kg)   LMP 11/19/2023   BMI 33.79 kg/m    General:  alert and no distress   Breasts:  not indicated  Lungs: clear to auscultation bilaterally  Heart:  regular rate and rhythm  Abdomen: soft, non-tender; bowel sounds normal; no masses,  no organomegaly   Back:  Point tenderness in lower lumbar region       Assessment:   1. Back pain of lumbosacral region with sciatica 2. History of gestational diabetes during recent pregnancy 3. Postpartum care following vaginal delivery  Plan:   Essential components of care per ACOG recommendations:  1.  Mood and well being: Patient with negative depression screening today. Reviewed local resources  for support.  - Patient tobacco use? No.   - hx of drug use? No.    2. Infant care and feeding:  -Patient currently breastmilk feeding? Yes. Reviewed importance of draining breast regularly to support lactation.  -Patient will see lactation consultant after this encounter. -Social determinants of health (SDOH) reviewed in EPIC. No concerns.  3. Sexuality, contraception and birth spacing - Patient does not want a pregnancy in the next year.  Desired family size is 2 children.  - Reviewed reproductive life planning. Reviewed contraceptive methods based on pt preferences and effectiveness.  Patient desired Female Condom today, she was  given a bag of condoms. - Discussed birth spacing of 18 months  4. Sleep and fatigue -Encouraged family/partner/community support of 4 hrs of uninterrupted sleep to help with mood and fatigue  5. Physical Recovery  - Discussed patients delivery and complications. She describes her labor as mixed. - Patient had a Vaginal, no problems at delivery. Patient had no laceration. Perineal healing reviewed. Patient expressed understanding - Patient has urinary incontinence? No. - Patient is safe to resume physical and sexual activity  6.  Health Maintenance - HM due items addressed Yes - Last pap smear  Diagnosis  Date Value Ref Range Status  03/02/2024   Final   - Negative for intraepithelial lesion or malignancy (NILM)   Pap smear not done at today's visit.  -Breast Cancer screening indicated? No.   7. Chronic Disease/Pregnancy Condition follow up: Gestational Diabetes - Postpartum 2  hr GTT done, will follow up results and manage accordingly. - PCP follow up  8.  Back pain of lumbosacral region with sciatica Concerned about worsening pain, recommended imaging and referral to PT for now. Also added Flexeril to help with pain; reviewed allowed amounts of Acetaminophen  and Ibuprofen  that can be taken daily. - MR Lumbar Spine W Wo Contrast; Future - Ambulatory referral to Physical Therapy - cyclobenzaprine (FLEXERIL) 10 MG tablet; Take 1 tablet (10 mg total) by mouth every 8 (eight) hours as needed for muscle spasms.  Dispense: 30 tablet; Refill: 1 - ibuprofen  (ADVIL ) 800 MG tablet; Take 1 tablet (800 mg total) by mouth 3 (three) times daily with meals as needed for headache, moderate pain (pain score 4-6) or cramping.  Dispense: 30 tablet; Refill: 3   Gloris Hugger, MD [facility] [employer]

## 2024-10-06 NOTE — Patient Instructions (Signed)
 Do not go over 2400 mg of Ibuprofen  daily And do not go over 4000 mg of acetaminophen  daily

## 2024-10-06 NOTE — Progress Notes (Signed)
 LC to room per RN request, to talk about birth control and milk supply.   Mom stated she is concerned about not having enough breast milk to feed baby once she is back at work on Monday. She is currently exclusively breast feeding and pumping after, every 3 hours. Mom mentioned baby is not latch well, is not concert after feeding and is having to feed baby the little that she dose pump out because pediatrician told her not to give baby any formula and work on breast feeding.  LC reviewed options of herbs and supplements that boost milk supply, pump frequency and duration, power pumping, nutrition and hydration. Mom stated she was working with lactation at her peds office and she already tried all of that and nothing worked. LC reviewed reglan to increases milk supply and side effects mom declined.   LC asked mom how would she like OP LC to help her meet her goals, mom stated that she needs to know what to feed her baby when she is back at work and can't breast feed because baby can have any formula per her pediatrician.   Mom stated she was going to call peds office to find out what she can supplement with and will cal OP LC back.   [employer]

## 2024-10-07 ENCOUNTER — Ambulatory Visit: Payer: Self-pay | Admitting: Surgery

## 2024-10-07 ENCOUNTER — Encounter: Payer: Self-pay | Admitting: Surgery

## 2024-10-07 VITALS — BP 110/70 | HR 78 | Ht 60.0 in | Wt 172.0 lb

## 2024-10-07 DIAGNOSIS — K8020 Calculus of gallbladder without cholecystitis without obstruction: Secondary | ICD-10-CM

## 2024-10-07 DIAGNOSIS — K219 Gastro-esophageal reflux disease without esophagitis: Secondary | ICD-10-CM

## 2024-10-07 LAB — GLUCOSE TOLERANCE, 2 HOURS
Glucose, 2 hour: 118 mg/dL (ref 70–139)
Glucose, GTT - Fasting: 74 mg/dL (ref 70–99)

## 2024-10-07 NOTE — Patient Instructions (Addendum)
 You may take Prilosec(Omeprazole) once a day for the next month to see if this stops your symptoms or helps them. Take this first thing in the morning before you eat.   We will have you follow up here in 1 month to discuss symptoms and next steps.    Gallbladder Problems: Eating Plan Having high blood cholesterol, obesity, an inactive lifestyle, an unhealthy diet, or diabetes can put you at risk for getting gallstones. If you have a gallbladder problem, you may have trouble digesting fats. You may also have symptoms when you eat a lot of fat. Eating a low-fat diet can help lessen your symptoms. It may be helpful before and after having surgery to have your gallbladder taken out. Your health care provider may recommend that you work with an expert in healthy eating called a dietitian. They can help you lower the amount of fat in your diet. What are tips for following this plan? General guidelines Limit how much fat you have to less than 30% of your total daily calories. If you eat around 1,800 calories each day, you'll be eating less than 60 grams (g) of fat a day. Fat is an important part of a healthy diet. Eating a low-fat diet can make it hard to keep a healthy body weight. Ask your dietitian how much fat, calories, and other nutrients you need each day. Eat small meals often throughout the day instead of 3 large meals. Drink at least 8-10 cups (1.9-2.4 L) of fluid a day. If you drink alcohol: Limit how much you have to: 0-1 drink a day if you're female. 0-2 drinks a day if you're female. Know how much alcohol is in your drink. In the [place] one drink is one 12 oz bottle of beer (355 mL), one 5 oz glass of wine (148 mL), or one 1 oz glass of hard liquor (44 mL). Reading food labels  Check nutrition facts on food labels for how much fat is in a serving. Choose foods with less than 3 grams of fat per serving. Shopping Choose nonfat and low-fat healthy foods. Look for the words nonfat,  low-fat, or fat-free. Avoid buying processed or prepackaged foods. Cooking Cook using low-fat methods, such as baking, broiling, grilling, or boiling. Cook with small amounts of healthy fats, such as olive oil, grapeseed oil, canola oil, avocado oil, or sunflower oil. What foods are recommended?  All fresh, frozen, or canned fruits and vegetables. Whole grains. Low-fat or nonfat (skim) milk and yogurt. Lean meat, skinless poultry, fish, eggs, and beans. Low-fat protein supplement powders or drinks. Spices and herbs. The items listed above may not be all the foods and drinks you can have. Talk with a dietitian to learn more. What foods are not recommended? High-fat foods. These include baked goods, fast food, fatty cuts of meat, ice cream, french toast, sweet rolls, pizza, cheese bread, foods covered with butter, creamy sauces, or cheese. Fried foods. These include french fries, tempura, battered fish, breaded chicken, fried breads, and sweets. Foods that cause bloating and gas. The items listed above may not be all the foods and drinks you should avoid. Talk with a dietitian to learn more. This information is not intended to replace advice given to you by your health care provider. Make sure you discuss any questions you have with your health care provider. Document Revised: 03/15/2024 Document Reviewed: 03/15/2024 Elsevier Patient Education  2025 [employer].

## 2024-10-07 NOTE — Progress Notes (Signed)
 10/07/2024  Reason for Visit: Cholelithiasis  Requesting Provider: Harlene Duncans, CNM  History of Present Illness: Erica Hall is a 28 y.o. female presenting for evaluation of cholelithiasis.  The patient is about 2 months postpartum and during her pregnancy she was admitted for observation on 06/03/2024 with upper abdominal pain.  At the time she was [redacted] weeks pregnant.  She had an ultrasound of the right upper quadrant which showed cholelithiasis without evidence of cholecystitis.  However the patient reports that she had significant issues with GERD during her pregnancy although she does not think that the pain was similar to her GERD episodes.  Over the last couple months after her delivery, she reports that she has continued having episodes of discomfort in the epigastric area with some episodes of sharp pain in the left upper quadrant and epigastric area.  She also reports soreness in the right upper quadrant but the main episodes of severe pain are in the left or middle.  She reports that the milder episodes do not last as long and usually taking Tums has helped with those.  The episodes are more intense last little bit longer and she feels that Tums does help to some degree.  Denies any vomiting.  She has been watching her diet as she was also diagnosed with gestational diabetes and so she has been staying away from greasy foods as well.  She does report that multiple family members have had issues some with GERD and some with gallstones.  Past Medical History: Past Medical History:  Diagnosis Date   GERD (gastroesophageal reflux disease)    occasional -diet controlled, no meds   Gestational diabetes    with first preg   Headache    Molar pregnancy 2019   Supervision of high risk pregnancy, antepartum 01/27/2024              NURSING     PROVIDER      Emergency planning/management officer for Women    Dating by    lmp      [facility] Model    Traditional    Anatomy U/S    complete      Initiated  care at     [employer]     English                     LAB RESULTS       Support Person    Not sure    Genetics    NIPS: LR F  AFP:                 NT/IT (FT only)                     Carrier Screen    H     Past Surgical History: Past Surgical History:  Procedure Laterality Date   DILATION AND EVACUATION N/A 12/30/2017   Procedure: DILATATION AND EVACUATION;  Surgeon: Nicholaus Burnard HERO, MD;  Location: [facility];  Service: Gynecology;  Laterality: N/A;   WISDOM TOOTH EXTRACTION      Home Medications: Prior to Admission medications   Medication Sig Start Date End Date Taking? Authorizing Provider  cyclobenzaprine (FLEXERIL) 10 MG tablet Take 1 tablet (10 mg total) by mouth every 8 (eight) hours as needed for muscle spasms.  10/06/24  Yes Anyanwu, Ugonna A, MD  ibuprofen  (ADVIL ) 800 MG tablet Take 1 tablet (800 mg total) by mouth 3 (three) times daily with meals as needed for headache, moderate pain (pain score 4-6) or cramping. 10/06/24  Yes Anyanwu, Ugonna A, MD  Prenatal Vit-Fe Fumarate-FA (PRENATAL PLUS VITAMIN/MINERAL) 27-1 MG TABS Take 1 tablet by mouth daily. 12/25/23  Yes Warren-Hill, Camie LABOR, CNM    Allergies: No Known Allergies  Social History:  reports that she has never smoked. She has never been exposed to tobacco smoke. She has never used smokeless tobacco. She reports that she does not currently use alcohol. She reports that she does not use drugs.   Family History: Family History  Problem Relation Age of Onset   Diabetes Mother        pre-diabetic   Hypertension Father    Hyperlipidemia Father    Hypertension Paternal Aunt    Hypertension Paternal Uncle    Diabetes Maternal Grandmother    Hypertension Paternal Grandmother    Diabetes Paternal Grandmother    Heart disease Paternal Grandfather    Diabetes Paternal Grandfather    Asthma Neg Hx    Cancer Neg Hx    Stroke Neg Hx     Review of Systems: Review of Systems  Constitutional:   Negative for chills and fever.  Respiratory:  Negative for shortness of breath.   Cardiovascular:  Negative for chest pain.  Gastrointestinal:  Positive for abdominal pain. Negative for nausea and vomiting.    Physical Exam BP 110/70   Pulse 78   Ht 5' (1.524 m)   Wt 172 lb (78 kg)   LMP 09/27/2024 (Approximate)   SpO2 99%   Breastfeeding Yes   BMI 33.59 kg/m  CONSTITUTIONAL: No acute distress HEENT:  Normocephalic, atraumatic, extraocular motion intact. RESPIRATORY:  Lungs are clear, and breath sounds are equal bilaterally. Normal respiratory effort without pathologic use of accessory muscles. CARDIOVASCULAR: Heart is regular without murmurs, gallops, or rubs. GI: The abdomen is soft, nondistended, with discomfort to palpation throughout her whole abdomen but more so located in the left upper quadrant.  She does have some soreness in the right upper quadrant and epigastric area but reports that the left upper quadrant is more sore compared to the other 2.  Negative Murphy's sign.  MUSCULOSKELETAL:  Normal muscle strength and tone in all four extremities.  No peripheral edema or cyanosis. NEUROLOGIC:  Motor and sensation is grossly normal.  Cranial nerves are grossly intact. PSYCH:  Alert and oriented to person, place and time. Affect is normal.  Laboratory Analysis: Labs from 08/20/2024: WBC 8.1, hemoglobin 9.8, hematocrit 31.3, platelets 245.  Labs from 06/03/2024: Sodium 137, potassium 3.7, chloride 105, CO2 21, BUN 7, creatinine 0.67.  Total bilirubin 0.8, AST 17, ALT 15, alkaline phosphatase 105.  Albumin 2.3.  Lipase 30.  Imaging: Ultrasound RUQ on 06/03/2024: FINDINGS: Gallbladder: Biliary sludge with multiple small stones. No wall thickening or pericholecystic fluid. No sonographic Murphy's sign noted by sonographer.   Common bile duct: Diameter: 4 mm   Liver: Normal echogenicity. No focal lesion identified. No intrahepatic biliary ductal dilation. Portal vein is patent  on color Doppler imaging with normal direction of blood flow towards the liver.   Right Kidney: Partially visualized. No mass. No hydronephrosis or nephrolithiasis.   Other: None.   IMPRESSION: Cholecystolithiasis. No changes of acute cholecystitis.  Assessment and Plan: This is a 28 y.o. female with cholelithiasis and GERD.  - Discussed with patient about  how pregnancy hormonal changes can result in issues with gallbladder with cholestasis and gallstone formation.  Discussed with her also that pregnancy can also affect or worsen GERD issues in patients.  At this point, it is a little bit unclear whether her symptoms are more from GERD or more from cholelithiasis or combination of both.  Her more intense pain episodes are in the left upper quadrant and not in the right upper quadrant but she does have soreness in the right upper quadrant and epigastric areas as well.  She also reports that taking Tums has helped with these episodes although to varying degrees. - Discussed with the patient that given the uncertainty, I would want to do a trial of a PPI first to see if her symptoms are mostly gallbladder or mostly GERD related.  Recommend that she start taking Prilosec once daily to help tease out some of the symptoms.  If her symptoms are resolved on her next visit, then most likely all of these were related to GERD issues.  However if the symptoms persist, then this could be truly related to cholelithiasis. - Patient will follow-up with me in a month at which time we will check on her progress, any possible symptoms, and we will decide on any surgical needs. - Return precautions given. - All of her questions have been answered.  I spent 30 minutes dedicated to the care of this patient on the date of this encounter to include pre-visit review of records, face-to-face time with the patient discussing diagnosis and management, and any post-visit coordination of care.   Aloysius Sheree Plant,  MD [facility]

## 2024-10-07 NOTE — Addendum Note (Signed)
 Addended byBETHA BARTHOLOMEW BARMAN on: 10/07/2024 08:28 AM   Modules accepted: Orders

## 2024-10-09 ENCOUNTER — Ambulatory Visit: Payer: Self-pay

## 2024-10-14 ENCOUNTER — Ambulatory Visit (HOSPITAL_COMMUNITY): Payer: Self-pay

## 2024-10-15 ENCOUNTER — Ambulatory Visit (HOSPITAL_COMMUNITY): Payer: Self-pay

## 2024-10-16 ENCOUNTER — Ambulatory Visit (HOSPITAL_COMMUNITY)
Admission: RE | Admit: 2024-10-16 | Discharge: 2024-10-16 | Disposition: A | Discharge status: Home / Self Care | Payer: Self-pay | Source: Ambulatory Visit | Attending: Obstetrics & Gynecology | Admitting: Obstetrics & Gynecology

## 2024-10-16 DIAGNOSIS — M5440 Lumbago with sciatica, unspecified side: Secondary | ICD-10-CM | POA: Insufficient documentation

## 2024-10-16 MED ORDER — GADOBUTROL 1 MMOL/ML IV SOLN
7.5000 mL | Freq: Once | INTRAVENOUS | Status: AC | PRN
  Administered 2024-10-16: 7.5 mL via INTRAVENOUS

## 2024-10-19 ENCOUNTER — Telehealth: Payer: Self-pay

## 2024-10-19 ENCOUNTER — Encounter: Payer: Self-pay | Admitting: Obstetrics & Gynecology

## 2024-10-19 NOTE — Telephone Encounter (Signed)
 Pt left voicemail stating that she had an MRI last week and is requesting to speak with provider about results.    [place], RN

## 2024-10-20 ENCOUNTER — Other Ambulatory Visit: Payer: Self-pay

## 2024-10-20 MED ORDER — FLUZONE 0.5 ML IM SUSY
0.5000 mL | PREFILLED_SYRINGE | Freq: Once | INTRAMUSCULAR | 0 refills | Status: AC
Start: 2024-10-20 — End: 2024-10-21
  Filled 2024-10-20: qty 0.5, 1d supply, fill #0

## 2024-10-21 ENCOUNTER — Ambulatory Visit: Payer: Self-pay | Admitting: Obstetrics & Gynecology

## 2024-10-21 ENCOUNTER — Ambulatory Visit: Payer: Self-pay | Admitting: Physical Therapy

## 2024-10-21 NOTE — Therapy (Incomplete)
 OUTPATIENT PHYSICAL THERAPY THORACOLUMBAR EVALUATION   Patient Name: Erica Hall MRN: [id number] DOB:1996/09/01, 28 y.o., female Today's Date: 10/21/2024  END OF SESSION:   Past Medical History:  Diagnosis Date   GERD (gastroesophageal reflux disease)    occasional -diet controlled, no meds   Gestational diabetes    with first preg   Headache    Molar pregnancy 2019   Supervision of high risk pregnancy, antepartum 01/27/2024              NURSING     PROVIDER      Office Location    [facility]    Dating by    lmp      [facility] Model    Traditional    Anatomy U/S    complete      Initiated care at     [employer]     English                     LAB RESULTS       Support Person    Not sure    Genetics    NIPS: LR F  AFP:                 NT/IT (FT only)                     Carrier Screen    H   Past Surgical History:  Procedure Laterality Date   DILATION AND EVACUATION N/A 12/30/2017   Procedure: DILATATION AND EVACUATION;  Surgeon: Nicholaus Burnard HERO, MD;  Location: [facility];  Service: Gynecology;  Laterality: N/A;   WISDOM TOOTH EXTRACTION     Patient Active Problem List   Diagnosis Date Noted   Gallstones 06/23/2024   Gestational diabetes 02/23/2024   History of molar pregnancy 09/10/2020    PCP: none  REFERRING PROVIDER: Herchel Gloris LABOR MD  REFERRING DIAG: M54.40 back pain of lumbosacral region with sciatica  Rationale for Evaluation and Treatment: Rehabilitation  THERAPY DIAG:  Back pain; weakness ONSET DATE: ***  SUBJECTIVE:                                                                                                                                                                                           SUBJECTIVE STATEMENT: ***  PERTINENT HISTORY:  Postpartum delivery 8/28  PAIN:   Are you having pain? Yes NPRS scale: ***/10 Pain location: *** Pain orientation: {Pain  Orientation:25161}  PAIN TYPE:  {type:313116} Pain description: {PAIN DESCRIPTION:21022940}  Aggravating factors: *** Relieving factors: ***   PRECAUTIONS: None    WEIGHT BEARING RESTRICTIONS: No  FALLS:  Has patient fallen in last 6 months? No  LIVING ENVIRONMENT: Lives with: lives with their family  OCCUPATION: ***  PLOF: Independent  PATIENT GOALS: ***  NEXT MD VISIT: ***  OBJECTIVE:  Note: Objective measures were completed at Evaluation unless otherwise noted.  DIAGNOSTIC FINDINGS:  Had MRI awaiting results  PATIENT SURVEYS:  Modified Oswestry:  MODIFIED OSWESTRY DISABILITY SCALE  Date: 10/31 Score  Pain intensity {ODI 1:32962}  2. Personal care (washing, dressing, etc.) {ODI 2:32963}  3. Lifting {ODI 3:32964}  4. Walking {ODI 4:32965}  5. Sitting {ODI 5:32966}  6. Standing {ODI 6:32967}  7. Sleeping {ODI 7:32968}  8. Social Life {ODI 8:32969}  9. Traveling {ODI 9:32970}  10. Employment/ Homemaking {ODI 10:32971}  Total ***/50   Interpretation of scores: Score Category Description  0-20% Minimal Disability The patient can cope with most living activities. Usually no treatment is indicated apart from advice on lifting, sitting and exercise  21-40% Moderate Disability The patient experiences more pain and difficulty with sitting, lifting and standing. Travel and social life are more difficult and they may be disabled from work. Personal care, sexual activity and sleeping are not grossly affected, and the patient can usually be managed by conservative means  41-60% Severe Disability Pain remains the main problem in this group, but activities of daily living are affected. These patients require a detailed investigation  61-80% Crippled Back pain impinges on all aspects of the patient's life. Positive intervention is required  81-100% Bed-bound These patients are either bed-bound or exaggerating their symptoms  Bluford FORBES Zoe DELENA Karon DELENA, et al. Surgery versus conservative management of  stable thoracolumbar fracture: the PRESTO feasibility RCT. [place] ([place]): [employer]; 2021 Nov. [facility] Technology Assessment, No. 25.62.) Appendix 3, Oswestry Disability Index category descriptors. Available from: Findjewelers.cz  Minimally Clinically Important Difference (MCID) = 12.8%  COGNITION: Overall cognitive status: Within functional limits for tasks assessed     SENSATION: {sensation:27233}  MUSCLE LENGTH: Hamstrings: Right *** deg; Left *** deg Debby test: Right *** deg; Left *** deg  POSTURE: {posture:25561}  PALPATION: ***  LUMBAR ROM:   AROM eval  Flexion   Extension   Right lateral flexion   Left lateral flexion   Right rotation   Left rotation    (Blank rows = not tested)  LOWER EXTREMITY ROM:    LOWER EXTREMITY MMT:    MMT Right eval Left eval  Hip flexion    Hip extension    Hip abduction    Hip adduction    Hip internal rotation    Hip external rotation    Knee flexion    Knee extension    Ankle dorsiflexion    Ankle plantarflexion    Ankle inversion    Ankle eversion     (Blank rows = not tested)  LUMBAR SPECIAL TESTS:  {lumbar special test:25242}  FUNCTIONAL TESTS:  {Functional tests:24029}  GAIT: Comments: ***  TREATMENT DATE: 10/21/24 evaluation  PATIENT EDUCATION:  Education details: Educated patient on anatomy and physiology of current symptoms, prognosis, plan of care as well as initial self care strategies to promote recovery Person educated: Patient Education method: Explanation Education comprehension: verbalized understanding  HOME EXERCISE PROGRAM: ***  ASSESSMENT:  CLINICAL IMPRESSION: Patient is a 28 y.o. female who was seen today for physical therapy evaluation and treatment for postpartum back pain of the lumbosacral region with sciatica.    OBJECTIVE IMPAIRMENTS: {opptimpairments:25111}.   ACTIVITY LIMITATIONS: {activitylimitations:27494}  PARTICIPATION LIMITATIONS: {participationrestrictions:25113}  PERSONAL FACTORS: {Personal factors:25162} are also affecting patient's functional outcome.   REHAB POTENTIAL: Good  CLINICAL DECISION MAKING: Stable/uncomplicated  EVALUATION COMPLEXITY: Low   GOALS: Goals reviewed with patient? Yes  SHORT TERM GOALS: Target date: ***  The patient will demonstrate knowledge of basic self care strategies and exercises to promote healing  Baseline: Goal status: INITIAL  2.  *** Baseline:  Goal status: INITIAL  3.  *** Baseline:  Goal status: INITIAL  4.  *** Baseline:  Goal status: INITIAL   LONG TERM GOALS: Target date: 12/16/2024    The patient will be independent in a safe self progression of a home exercise program to promote further recovery of function  Baseline:  Goal status: INITIAL  2.  *** Baseline:  Goal status: INITIAL  3.  *** Baseline:  Goal status: INITIAL  4.  *** Baseline:  Goal status: INITIAL  5.  Modified Oswestry functional outcome measure score improved to     % indicating improved function with ADLS with less pain.   Baseline:  Goal status: INITIAL   PLAN:  PT FREQUENCY: {rehab frequency:25116}  PT DURATION: {rehab duration:25117}  PLANNED INTERVENTIONS: 97164- PT Re-evaluation, 97750- Physical Performance Testing, 97110-Therapeutic exercises, 97530- Therapeutic activity, 97112- Neuromuscular re-education, 97535- Self Care, 02859- Manual therapy, [id number]- Aquatic Therapy, H9716- Electrical stimulation (unattended), [phone]- Electrical stimulation (manual), [id number]- Ultrasound, [id number]- Traction (mechanical), [id number]- Ionotophoresis 4mg /ml Dexamethasone , 79439 (1-2 muscles), 20561 (3+ muscles)- Dry Needling, Patient/Family education, Taping, Joint mobilization, Spinal manipulation, Spinal mobilization, Cryotherapy, and Moist heat.  PLAN FOR  NEXT SESSION: ***

## 2024-11-02 NOTE — Therapy (Addendum)
 OUTPATIENT PHYSICAL THERAPY THORACOLUMBAR EVALUATION   Patient Name: Erica Hall MRN: [id number] DOB:02/28/1996, 28 y.o., female Today's Date: 11/03/2024  END OF SESSION:  PT End of Session - 11/03/24 1153     Visit Number 1    Date for Recertification  12/29/24    Authorization Type Self Pay    PT Start Time 0825   Patient arrival time   PT Stop Time 0851    PT Time Calculation (min) 26 min    Activity Tolerance Patient tolerated treatment well;Patient limited by pain    Behavior During Therapy [facility] for tasks assessed/performed          Past Medical History:  Diagnosis Date   GERD (gastroesophageal reflux disease)    occasional -diet controlled, no meds   Gestational diabetes    with first preg   Headache    Molar pregnancy 2019   Supervision of high risk pregnancy, antepartum 01/27/2024              NURSING     PROVIDER      Office Location    [facility]    Dating by    lmp      [facility] Model    Traditional    Anatomy U/S    complete      Initiated care at     [employer]     English                     LAB RESULTS       Support Person    Not sure    Genetics    NIPS: LR F  AFP:                 NT/IT (FT only)                     Carrier Screen    H   Past Surgical History:  Procedure Laterality Date   DILATION AND EVACUATION N/A 12/30/2017   Procedure: DILATATION AND EVACUATION;  Surgeon: Nicholaus Burnard HERO, MD;  Location: [facility];  Service: Gynecology;  Laterality: N/A;   WISDOM TOOTH EXTRACTION     Patient Active Problem List   Diagnosis Date Noted   Gallstones 06/23/2024   Gestational diabetes 02/23/2024   History of molar pregnancy 09/10/2020    PCP: None  REFERRING PROVIDER: Herchel Gloris LABOR, MD  REFERRING DIAG:  Diagnosis  M54.40 (ICD-10-CM) - Back pain of lumbosacral region with sciatica    Rationale for Evaluation and Treatment: Rehabilitation  THERAPY DIAG:  Other low back pain - Plan: PT plan of care  cert/re-cert  Muscle weakness (generalized) - Plan: PT plan of care cert/re-cert  Other lack of coordination - Plan: PT plan of care cert/re-cert  ONSET DATE: September 05, 2024  SUBJECTIVE:  SUBJECTIVE STATEMENT: Patient is postpartum and she presents with increased back pain that began 3 weeks after she gave birth (Aug 19, 2024). She did not get an epidural with this birth, but she got an epidural in 2021 with her first daughter. When they administered her epidural they had to insert it twice. She feels weak from her core down to her legs. She has pain with she bends to pick up her daughter. Her youngest daughter is 2 mos and her oldest daughter is turning 4 in December. She is not comfortable laying on her back. When she sits for > 10 mins she has numbness / tingling in her legs especially her left leg. She is breast feeding. She takes ibuprofen  but she does not note any changes in her pain.    PERTINENT HISTORY:  Patient is post-partum 08/19/2024 21 month old and a 28 year old  PAIN:  Are you having pain? Yes: NPRS scale: 7(currently) 10(worst)/10 Pain location: upper lumbar spine and radiates to lower lumber spine and bilateral hips Pain description: achy; sharp Aggravating factors: walking > 2 mins; standing > 10-15 mins;  Relieving factors: Nothing; she tries to change positions; heat/cold   PRECAUTIONS: None  RED FLAGS: None   WEIGHT BEARING RESTRICTIONS: No  FALLS:  Has patient fallen in last 6 months? 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  DIAGNOSTIC FINDINGS:  Lumbar MR 10/21/2024 SPINAL CORD: The conus terminates normally.   SOFT TISSUES: No acute abnormality.  IMPRESSION: 1. No acute findings.  PATIENT SURVEYS:  Modified Oswestry:   Interpretation of scores: Score Category Description  0-20% Minimal Disability The patient can cope with most living activities. Usually no treatment is indicated apart from advice on lifting, sitting and exercise  21-40% Moderate Disability The patient experiences more pain and difficulty with sitting, lifting and standing. Travel and social life are more difficult and they may be disabled from work. Personal care, sexual activity and sleeping are not grossly affected, and the patient can usually be managed by conservative means  41-60% Severe Disability Pain remains the main problem in this group, but activities of daily living are affected. These patients require a detailed investigation  61-80% Crippled Back pain impinges on all aspects of the patient's life. Positive intervention is required  81-100% Bed-bound These patients are either bed-bound or exaggerating their symptoms  Bluford FORBES Erica Hall Erica Hall, et al. Surgery versus conservative management of stable thoracolumbar fracture: the PRESTO feasibility RCT. [place] ([place]): [employer]; 2021 Nov. [facility] Technology Assessment, No. 25.62.) Appendix 3, Oswestry Disability Index category descriptors. Available from: Findjewelers.cz  Minimally Clinically Important Difference (MCID) = 12.8%  COGNITION: Overall cognitive status: Within functional limits for tasks assessed     SENSATION: When she sits for > 10 mins she has numbness / tingling in her legs  especially her left leg.    POSTURE: sit with a posterior pelvic tilt (provided education on correct seated posture)  PALPATION:   LUMBAR ROM: * pain  AROM eval  Flexion 70% limited*  Extension 80% limited  Right lateral  flexion Bends past knee joint line*  Left lateral flexion Bends past knee joint line  Right rotation 50% limited  Left rotation 50% limited *   (Blank rows = not tested)   LOWER EXTREMITY MMT:    MMT Right eval Left eval  Hip flexion    Hip extension    Hip abduction    Hip adduction    Hip internal rotation    Hip external rotation    Knee flexion    Knee extension    Ankle dorsiflexion    Ankle plantarflexion    Ankle inversion    Ankle eversion     (Blank rows = not tested)   FUNCTIONAL TESTS:  5 times sit to stand: 26.95 sec with UE support (increased back pain)    TREATMENT DATE:  11/03/2024 Initial Evaluation & HEP created                                                                                                                              Eval limited due to patient being late to appointment Use of using towel roll when sitting at work Benefits of pelvic floor therapy   PATIENT EDUCATION:  Education details: PT eval findings, anticipated POC, progress with PT, and initial HEP Person educated: Patient Education method: Explanation, Demonstration, and Handouts Education comprehension: verbalized understanding, returned demonstration, and needs further education  HOME EXERCISE PROGRAM: Access Code: [email or web address] URL: [email or web address] Date: 11/03/2024 Prepared by: Kristeen Sar  Exercises - Seated Abdominal Press into [employer]  - 1 x daily - 7 x weekly - 1-2 sets - 10 reps - 3-4 hold - Seated Hip Adduction Isometrics with Ball  - 1 x daily - 7 x weekly - 1-2 sets - 10 reps - Seated Quadratus Lumborum Stretch in Chair  - 1 x daily - 7 x weekly - 2 sets - 3-4 hold  ASSESSMENT:  CLINICAL IMPRESSION: Patient  is a 28 y.o. female who was seen today for physical therapy evaluation and treatment for low back pain. Erica Hall presents 10 weeks post partum with increased back pain that is limiting her standing, walking and sitting tolerance. She is limited to standing for about 10-15 mins and walking very short distances. She has increased low back pain when bending to pick up her children. 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 Based on evaluation noted decreased core activation, poor posture, and decreased ROM. Patient is highly motivated and wants to decrease pain.  Patient demonstrates good rehab potential to achieve stated goals through skilled therapy intervention.    OBJECTIVE IMPAIRMENTS: decreased mobility, difficulty walking, decreased ROM, decreased strength, increased muscle spasms, impaired flexibility, impaired sensation, postural dysfunction, and pain.   ACTIVITY LIMITATIONS: carrying, lifting, bending, sitting, standing, squatting, sleeping, transfers, self feeding, hygiene/grooming, locomotion level, and caring for others  PARTICIPATION LIMITATIONS: meal prep, cleaning, laundry, driving, shopping,  community activity, and occupation  PERSONAL FACTORS: Time since onset of injury/illness/exacerbation are also affecting patient's functional outcome.   REHAB POTENTIAL: Good  CLINICAL DECISION MAKING: Evolving/moderate complexity  EVALUATION COMPLEXITY: Moderate   GOALS: Goals reviewed with patient? Yes  SHORT TERM GOALS: Target date: 12/01/2024  Patient will be independent with initial HEP. Baseline:  Goal status: INITIAL  2.  Patient will report > or = to 30% improvement in back pain since starting PT. Baseline:  Goal status: INITIAL  3.  Patient to undergo evaluation by a pelvic floor therapist to establish baseline. Baseline:  Goal status: INITIAL  4.  Patient will be able to > or = to 10 mins  with < or = to 3/10 back pain to improve ability to care for her children and complete work duties. Baseline: 2 mins Goal status: INITIAL  5.  Patient will demonstrate proper lifting/ hinging technique to ensure safety when picking up her children. Baseline:  Goal status: INITIAL    LONG TERM GOALS: Target date: 12/29/2024 Patient will demonstrate independence in advanced HEP. Baseline:  Goal status: INITIAL  2.  Patient will report > or = to 70% improvement in back pain since starting PT. Baseline:  Goal status: INITIAL  3.  Patient will demonstrate correct TA activation and breathing technique while performing exercises with no cues. Baseline:  Goal status: INITIAL  4.  Patient will score < or = to 16/50 on ODI due to decreased self perceived disability and improved function. Baseline: 26/50 52% Goal status: INITIAL  5.  Patient will perform 5STS in < or = to 20 sec due to improved LE strength and functional mobility. Baseline: 26 sec Goal status: INITIAL    PLAN:  PT FREQUENCY: 1-2x/week  PT DURATION: 8 weeks  PLANNED INTERVENTIONS: 97164- PT Re-evaluation, 97110-Therapeutic exercises, 97530- Therapeutic activity, 97112- Neuromuscular re-education, 97535- Self Care, 02859- Manual therapy, [id number]- Gait training, [phone]- Canalith repositioning, [id number]- Aquatic Therapy, [phone]- Electrical stimulation (unattended), [phone]- Electrical stimulation (manual), [id number]- Vasopneumatic device, [id number]- Ultrasound, [id number]- Traction (mechanical), [id number]- Ionotophoresis 4mg /ml Dexamethasone , 79439 (1-2 muscles), 20561 (3+ muscles)- Dry Needling, Patient/Family education, Balance training, Stair training, Taping, Joint mobilization, Joint manipulation, Spinal manipulation, Spinal mobilization, Vestibular training, Cryotherapy, and Moist heat.  PLAN FOR NEXT SESSION: Review HEP; patient to undergo assessment by pelvic floor therapist; core strengthening (patient isn't comfortable laying supine);  hinging technique; lumbar/hip mobility; mechanics for picking up children   Kristeen Sar, PT, DPT 11/03/24 12:05 PM [facility] [address] [place], [place] [address] Phone # [phone] Fax [phone]

## 2024-11-03 ENCOUNTER — Encounter: Payer: Self-pay | Admitting: Physical Therapy

## 2024-11-03 ENCOUNTER — Other Ambulatory Visit: Payer: Self-pay

## 2024-11-03 ENCOUNTER — Ambulatory Visit: Payer: Self-pay | Attending: Obstetrics & Gynecology | Admitting: Physical Therapy

## 2024-11-03 DIAGNOSIS — R278 Other lack of coordination: Secondary | ICD-10-CM | POA: Insufficient documentation

## 2024-11-03 DIAGNOSIS — M5459 Other low back pain: Secondary | ICD-10-CM

## 2024-11-03 DIAGNOSIS — M5440 Lumbago with sciatica, unspecified side: Secondary | ICD-10-CM | POA: Insufficient documentation

## 2024-11-03 DIAGNOSIS — M6281 Muscle weakness (generalized): Secondary | ICD-10-CM | POA: Insufficient documentation

## 2024-11-04 ENCOUNTER — Ambulatory Visit: Payer: Self-pay | Admitting: Surgery

## 2024-11-06 ENCOUNTER — Other Ambulatory Visit: Payer: Self-pay

## 2024-11-07 ENCOUNTER — Encounter: Payer: Self-pay | Admitting: Obstetrics & Gynecology

## 2024-11-07 DIAGNOSIS — M5440 Lumbago with sciatica, unspecified side: Secondary | ICD-10-CM

## 2024-11-10 ENCOUNTER — Ambulatory Visit: Payer: Self-pay | Admitting: Physical Therapy

## 2024-11-10 DIAGNOSIS — M5459 Other low back pain: Secondary | ICD-10-CM

## 2024-11-10 DIAGNOSIS — R278 Other lack of coordination: Secondary | ICD-10-CM

## 2024-11-10 DIAGNOSIS — M6281 Muscle weakness (generalized): Secondary | ICD-10-CM

## 2024-11-10 NOTE — Therapy (Addendum)
 OUTPATIENT PHYSICAL THERAPY THORACOLUMBAR EVALUATION and pelvic floor assessment/treatment   Patient Name: Erica Hall MRN: [id number] DOB:1996/09/17, 28 y.o., female Today's Date: 11/10/2024  END OF SESSION:  PT End of Session - 11/10/24 0724     Visit Number 2    Date for Recertification  12/29/24    Authorization Type Self Pay    PT Start Time 0721   arrival time   PT Stop Time 0800    PT Time Calculation (min) 39 min    Activity Tolerance Patient tolerated treatment well;Patient limited by pain    Behavior During Therapy [facility] for tasks assessed/performed          Past Medical History:  Diagnosis Date   GERD (gastroesophageal reflux disease)    occasional -diet controlled, no meds   Gestational diabetes    with first preg   Headache    Molar pregnancy 2019   Supervision of high risk pregnancy, antepartum 01/27/2024              NURSING     PROVIDER      Office Location    [facility]    Dating by    lmp      [facility] Model    Traditional    Anatomy U/S    complete      Initiated care at     [employer]     English                     LAB RESULTS       Support Person    Not sure    Genetics    NIPS: LR F  AFP:                 NT/IT (FT only)                     Carrier Screen    H   Past Surgical History:  Procedure Laterality Date   DILATION AND EVACUATION N/A 12/30/2017   Procedure: DILATATION AND EVACUATION;  Surgeon: Nicholaus Burnard HERO, MD;  Location: [facility];  Service: Gynecology;  Laterality: N/A;   WISDOM TOOTH EXTRACTION     Patient Active Problem List   Diagnosis Date Noted   Gallstones 06/23/2024   Gestational diabetes 02/23/2024   History of molar pregnancy 09/10/2020    PCP: None  REFERRING PROVIDER: Herchel Gloris LABOR, MD  REFERRING DIAG:  Diagnosis  M54.40 (ICD-10-CM) - Back pain of lumbosacral region with sciatica    Rationale for Evaluation and Treatment: Rehabilitation  THERAPY DIAG:  Other low back  pain  Muscle weakness (generalized)  Other lack of coordination  ONSET DATE: September 05, 2024  SUBJECTIVE:  SUBJECTIVE STATEMENT: Having back and abdominal pain postpartum. Has weakness in core and reports most pain at abdomin in upper middle quadrant. Cannot lay on back at all it hurts so bad, has numbness/tingling in legs but worse in Lt.  Is lactating.    PERTINENT HISTORY:  Patient is post-partum 08/19/2024 35 month old and a 27 year old  PAIN:  Are you having pain? Yes: NPRS scale: 7(currently) 10(worst)/10 Pain location: upper lumbar spine and radiates to lower lumber spine and bilateral hips Pain description: achy; sharp Aggravating factors: walking > 2 mins; standing > 10-15 mins;  Relieving factors: Nothing; she tries to change positions; heat/cold   PRECAUTIONS: None  RED FLAGS: None   WEIGHT BEARING RESTRICTIONS: No  FALLS:  Has patient fallen in last 6 months? 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  DIAGNOSTIC FINDINGS:  Lumbar MR 10/21/2024 SPINAL CORD: The conus terminates normally.   SOFT TISSUES: No acute abnormality.  IMPRESSION: 1. No acute findings.  PATIENT SURVEYS:  Modified Oswestry:   Interpretation of scores: Score Category Description  0-20% Minimal Disability The patient can cope  with most living activities. Usually no treatment is indicated apart from advice on lifting, sitting and exercise  21-40% Moderate Disability The patient experiences more pain and difficulty with sitting, lifting and standing. Travel and social life are more difficult and they may be disabled from work. Personal care, sexual activity and sleeping are not grossly affected, and the patient can usually be managed by conservative means  41-60% Severe Disability Pain remains the main problem in this group, but activities of daily living are affected. These patients require a detailed investigation  61-80% Crippled Back pain impinges on all aspects of the patient's life. Positive intervention is required  81-100% Bed-bound These patients are either bed-bound or exaggerating their symptoms  Bluford FORBES Zoe DELENA Karon DELENA, et al. Surgery versus conservative management of stable thoracolumbar fracture: the PRESTO feasibility RCT. [place] ([place]): [employer]; 2021 Nov. [facility] Technology Assessment, No. 25.62.) Appendix 3, Oswestry Disability Index category descriptors. Available from: Findjewelers.cz  Minimally Clinically Important Difference (MCID) = 12.8%  COGNITION: Overall cognitive status: Within functional limits for tasks assessed     SENSATION: When she sits for > 10 mins she has numbness / tingling in her legs especially her left leg.    POSTURE: sit with a posterior pelvic tilt (provided education on correct seated posture)  PALPATION:   LUMBAR ROM: * pain  AROM eval  Flexion 70% limited*  Extension 80% limited  Right lateral flexion Bends past knee joint line*  Left lateral flexion Bends past knee joint line  Right rotation 50% limited  Left rotation 50% limited *   (Blank rows = not tested)   LOWER EXTREMITY MMT:    MMT Right eval Left eval  Hip flexion    Hip extension    Hip abduction    Hip adduction    Hip internal rotation     Hip external rotation    Knee flexion    Knee extension    Ankle dorsiflexion  Ankle plantarflexion    Ankle inversion    Ankle eversion     (Blank rows = not tested)   FUNCTIONAL TESTS:  5 times sit to stand: 26.95 sec with UE support (increased back pain)  PELVIC FLOOR: X2 vaginal births tearing with first (2nd deg), first deg with second baby - epidural with first not second but needed it inserting 2x Pain with intercourse deep, positions make it worse, does feel dry externally and internally.  Reports she has sensation change intermittently without pattern between legs and with wiping  Urine - had a lot of urinary incontinence with stressors with first pregnancy, does have urinary incontinence now with coughing, sneezing, laughing. feels like she isn't emptying, low urges in general needs a really full bladder. Doesn't wake up at night to urinate, and usually around 3-4 hours between. Does need to strain to empty bladder has urge (low) but has hesitation.   Bowels - no concerns   Pressure felt at vaginal with lifting, stairs  DRA SEPARATION - 1.5 finger width separation above umbilicus but no distortion does have pain  Sit up test 1/3 but limited with pain and does hold breath  Difficulty with core activation   No emotional/communication barriers or cognitive limitation. Patient is motivated to learn. Patient understands and agrees with treatment goals and plan. PT explains patient will be examined in standing, sitting, and lying down to see how their muscles and joints work. When they are ready, they will be asked to remove their underwear so PT can examine their perineum. The patient is also given the option of providing their own chaperone as one is not provided in our facility. The patient also has the right and is explained the right to defer or refuse any part of the evaluation or treatment including the internal exam. With the patient's consent, PT will use one gloved  finger to gently assess the muscles of the pelvic floor, seeing how well it contracts and relaxes and if there is muscle symmetry. After, the patient will get dressed and PT and patient will discuss exam findings and plan of care. PT and patient discuss plan of care, schedule, attendance policy and HEP activities.   3/5 strength, 7s, 5 reps vaginally   TREATMENT DATE:  11/03/2024 Initial Evaluation & HEP created                                                                                                                              Eval limited due to patient being late to appointment Use of using towel roll when sitting at work Benefits of pelvic floor therapy  11/10/24: Re-eval completed with pt confirming 2 identifiers.  All pelvic floor questions completed and findings above in objective findings Patient consented to internal pelvic floor assessment vaginally this date and found to have decreased strength, endurance, and coordination. But contracting consistently. Does have tension in deep layers bil and lt superficially with TTP with tense  areas. Pt also has TTP in Rt obturator, midline superficial and deep (worse in deep). Pt also have TTP at Lt superficial transverse perineal but tension bil and at perineal. Patient benefited from verbal cues for improved technique with pelvic floor contractions and coordination with breathing with pelvic floor activation.   PATIENT EDUCATION:  Education details: PT eval findings, anticipated POC, progress with PT, and initial HEP Person educated: Patient Education method: Explanation, Demonstration, and Handouts Education comprehension: verbalized understanding, returned demonstration, and needs further education  HOME EXERCISE PROGRAM: Access Code: [email or web address] URL: [email or web address] Date: 11/10/2024 Prepared by: Darryle   Exercises - Seated Abdominal Press into [employer]  - 1 x daily - 7 x weekly - 1-2 sets - 10 reps - 3-4  hold - Seated Hip Adduction Isometrics with Ball  - 1 x daily - 7 x weekly - 1-2 sets - 10 reps - Seated Quadratus Lumborum Stretch in Chair  - 1 x daily - 7 x weekly - 2 sets - 3-4 hold - Seated Diaphragmatic Breathing  - 1 x daily - 7 x weekly - 1 sets - 10 reps - Supine Hip Internal and External Rotation  - 1 x daily - 7 x weekly - 1 sets - 10 reps - Seated Cough with Pelvic Floor Contraction and Hand to Mouth  - 1 x daily - 7 x weekly - 1 sets - 10 reps  ASSESSMENT:  CLINICAL IMPRESSION: Patient is a 28 y.o. female who was seen today for physical therapy assessment of pelvic floor and treatment for low back pain. Pt consented to internal pelvic floor assessment and treatment today, verbally. Pt found to have decreased strength, endurance and coordination but also tension throughout and TTP. Pt has decreased core and hip strength with DRA separation noted, increased pain with attempt to sit up and poor activation of abdominals. Pt also has pain and difficulty with bed mobility, breathing holding throughout movements tested and MMTs, instability at pelvis, improved pain with compression test in hooklying and sidelying bil.  Patient demonstrates good rehab potential to achieve stated goals through skilled therapy intervention.    OBJECTIVE IMPAIRMENTS: decreased mobility, difficulty walking, decreased ROM, decreased strength, increased muscle spasms, impaired flexibility, impaired sensation, postural dysfunction, and pain.   ACTIVITY LIMITATIONS: carrying, lifting, bending, sitting, standing, squatting, sleeping, transfers, self feeding, hygiene/grooming, locomotion level, and caring for others  PARTICIPATION LIMITATIONS: meal prep, cleaning, laundry, driving, shopping, community activity, and occupation  PERSONAL FACTORS: Time since onset of injury/illness/exacerbation are also affecting patient's functional outcome.   REHAB POTENTIAL: Good  CLINICAL DECISION MAKING: Evolving/moderate  complexity  EVALUATION COMPLEXITY: Moderate   GOALS: Goals reviewed with patient? Yes  SHORT TERM GOALS: Target date: 12/01/2024  Patient will be independent with initial HEP. Baseline:  Goal status: INITIAL  2.  Patient will report > or = to 30% improvement in back pain since starting PT. Baseline:  Goal status: INITIAL  3.  Patient to undergo evaluation by a pelvic floor therapist to establish baseline. Baseline:  Goal status: met 11/20  4.  Patient will be able to > or = to 10 mins with < or = to 3/10 back pain to improve ability to care for her children and complete work duties. Baseline: 2 mins Goal status: INITIAL  5.  Patient will demonstrate proper lifting/ hinging technique to ensure safety when picking up her children. Baseline:  Goal status: INITIAL    LONG TERM GOALS: Target date: 12/29/2024 Patient will demonstrate independence in advanced HEP.  Baseline:  Goal status: INITIAL  2.  Patient will report > or = to 70% improvement in back pain since starting PT. Baseline:  Goal status: INITIAL  3.  Patient will demonstrate correct TA activation and breathing technique while performing exercises with no cues. Baseline:  Goal status: INITIAL  4.  Patient will score < or = to 16/50 on ODI due to decreased self perceived disability and improved function. Baseline: 26/50 52% Goal status: INITIAL  5.  Patient will perform 5STS in < or = to 20 sec due to improved LE strength and functional mobility. Baseline: 26 sec Goal status: INITIAL  6. Pt to demonstrate ability to complete x10 single leg sit to stands without compensation for improved stability with lifting children.  Baseline:  Goal status: new 11/20  7. Pt to demonstrate improved posture midline upright for at least 30 mins without compensation for decreased strain at pelvic floor and improve mechanics for prolonged standing.  Baseline:  Goal status: new 11/20  8. Pt to report ability to sit at least  30 mins to tolerate taking child to doctor's appointments.  Baseline:  Goal status: new 11/20  9. Pt to report no pain with vaginal penetration for improved tolerance to medical exams.   Baseline:  Goal status: new 11/20  PLAN:  PT FREQUENCY: 1-2x/week  PT DURATION: 8 weeks  PLANNED INTERVENTIONS: 97164- PT Re-evaluation, 97110-Therapeutic exercises, 97530- Therapeutic activity, 97112- Neuromuscular re-education, 97535- Self Care, 02859- Manual therapy, [phone]- Gait training, [phone]- Canalith repositioning, [id number]- Aquatic Therapy, [phone]- Electrical stimulation (unattended), [phone]- Electrical stimulation (manual), [id number]- Vasopneumatic device, [id number]- Ultrasound, [id number]- Traction (mechanical), [id number]- Ionotophoresis 4mg /ml Dexamethasone , 79439 (1-2 muscles), [id number] (3+ muscles)- Dry Needling, Patient/Family education, Balance training, Stair training, Taping, Joint mobilization, Joint manipulation, Spinal manipulation, Spinal mobilization, Vestibular training, Cryotherapy, and Moist heat.  PLAN FOR NEXT SESSION: Review HEP;  core strengthening (patient isn't comfortable laying supine); hinging technique; lumbar/hip mobility; mechanics for picking up children  Pelvic floor - hip IR strength and mobility, happy baby, childs pose, core strengthening, all exercises with exhale, manual for mid and low back, ribs, and diaphragm    Darryle Navy, PT, DPT 11/20/259:44 AM  [facility] [address] [place], [place] [address] Phone # [phone] Fax [phone]

## 2024-11-11 ENCOUNTER — Encounter: Payer: Self-pay | Admitting: Rehabilitative and Restorative Service Providers"

## 2024-11-11 ENCOUNTER — Ambulatory Visit: Payer: Self-pay | Admitting: Rehabilitative and Restorative Service Providers"

## 2024-11-11 DIAGNOSIS — M5459 Other low back pain: Secondary | ICD-10-CM

## 2024-11-11 DIAGNOSIS — M6281 Muscle weakness (generalized): Secondary | ICD-10-CM

## 2024-11-11 DIAGNOSIS — R278 Other lack of coordination: Secondary | ICD-10-CM

## 2024-11-11 NOTE — Therapy (Signed)
 OUTPATIENT PHYSICAL THERAPY THORACOLUMBAR EVALUATION and pelvic floor assessment/treatment   Patient Name: Erica Hall MRN: [id number] DOB:01/14/1996, 28 y.o., female Today's Date: 11/11/2024  END OF SESSION:  PT End of Session - 11/11/24 1116     Visit Number 3    Date for Recertification  12/29/24    Authorization Type 100% Financial Assistance Letter 09/06/24 - 03/06/2024    PT Start Time 1106    PT Stop Time 1145    PT Time Calculation (min) 39 min    Activity Tolerance Patient limited by pain    Behavior During Therapy [facility] for tasks assessed/performed          Past Medical History:  Diagnosis Date   GERD (gastroesophageal reflux disease)    occasional -diet controlled, no meds   Gestational diabetes    with first preg   Headache    Molar pregnancy 2019   Supervision of high risk pregnancy, antepartum 01/27/2024              NURSING     PROVIDER      Office Location    [facility]    Dating by    lmp      [facility] Model    Traditional    Anatomy U/S    complete      Initiated care at     [employer]     English                     LAB RESULTS       Support Person    Not sure    Genetics    NIPS: LR F  AFP:                 NT/IT (FT only)                     Carrier Screen    H   Past Surgical History:  Procedure Laterality Date   DILATION AND EVACUATION N/A 12/30/2017   Procedure: DILATATION AND EVACUATION;  Surgeon: Nicholaus Burnard HERO, MD;  Location: [facility];  Service: Gynecology;  Laterality: N/A;   WISDOM TOOTH EXTRACTION     Patient Active Problem List   Diagnosis Date Noted   Gallstones 06/23/2024   Gestational diabetes 02/23/2024   History of molar pregnancy 09/10/2020    PCP: None  REFERRING PROVIDER: Herchel Gloris LABOR, MD  REFERRING DIAG:  Diagnosis  M54.40 (ICD-10-CM) - Back pain of lumbosacral region with sciatica    Rationale for Evaluation and Treatment: Rehabilitation  THERAPY DIAG:  Other low back  pain  Muscle weakness (generalized)  Other lack of coordination  ONSET DATE: September 05, 2024  SUBJECTIVE:  SUBJECTIVE STATEMENT: Patient reports that she has been doing her exercises.  States that she is having some pain shooting down her left leg.  Is lactating.    PERTINENT HISTORY:  Patient is post-partum 08/19/2024 45 month old and a 27 year old  PAIN:  Are you having pain? Yes: NPRS scale: 4-7/10 Pain location: upper lumbar spine and radiates to lower lumber spine and bilateral hips Pain description: achy; sharp Aggravating factors: walking > 2 mins; standing > 10-15 mins;  Relieving factors: Nothing; she tries to change positions; heat/cold   PRECAUTIONS: None  RED FLAGS: None   WEIGHT BEARING RESTRICTIONS: No  FALLS:  Has patient fallen in last 6 months? 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  DIAGNOSTIC FINDINGS:  Lumbar MR 10/21/2024 SPINAL CORD: The conus terminates normally.   SOFT TISSUES: No acute abnormality.  IMPRESSION: 1. No acute findings.  PATIENT SURVEYS:  Modified Oswestry:   Interpretation of scores: Score Category Description  0-20% Minimal Disability The patient can cope with most living activities. Usually no treatment is indicated apart from advice on lifting, sitting and  exercise  21-40% Moderate Disability The patient experiences more pain and difficulty with sitting, lifting and standing. Travel and social life are more difficult and they may be disabled from work. Personal care, sexual activity and sleeping are not grossly affected, and the patient can usually be managed by conservative means  41-60% Severe Disability Pain remains the main problem in this group, but activities of daily living are affected. These patients require a detailed investigation  61-80% Crippled Back pain impinges on all aspects of the patient's life. Positive intervention is required  81-100% Bed-bound These patients are either bed-bound or exaggerating their symptoms  Bluford FORBES Zoe DELENA Karon DELENA, et al. Surgery versus conservative management of stable thoracolumbar fracture: the PRESTO feasibility RCT. [place] ([place]): [employer]; 2021 Nov. [facility] Technology Assessment, No. 25.62.) Appendix 3, Oswestry Disability Index category descriptors. Available from: Findjewelers.cz  Minimally Clinically Important Difference (MCID) = 12.8%  COGNITION: Overall cognitive status: Within functional limits for tasks assessed     SENSATION: When she sits for > 10 mins she has numbness / tingling in her legs especially her left leg.    POSTURE: sit with a posterior pelvic tilt (provided education on correct seated posture)  PALPATION:   LUMBAR ROM: * pain  AROM eval  Flexion 70% limited*  Extension 80% limited  Right lateral flexion Bends past knee joint line*  Left lateral flexion Bends past knee joint line  Right rotation 50% limited  Left rotation 50% limited *   (Blank rows = not tested)   LOWER EXTREMITY MMT:    MMT Right eval Left eval  Hip flexion    Hip extension    Hip abduction    Hip adduction    Hip internal rotation    Hip external rotation    Knee flexion    Knee extension    Ankle dorsiflexion    Ankle  plantarflexion    Ankle inversion    Ankle eversion     (  Blank rows = not tested)   FUNCTIONAL TESTS:   Eval:  5 times sit to stand: 26.95 sec with UE support (increased back pain)  PELVIC FLOOR: X2 vaginal births tearing with first (2nd deg), first deg with second baby - epidural with first not second but needed it inserting 2x Pain with intercourse deep, positions make it worse, does feel dry externally and internally.  Reports she has sensation change intermittently without pattern between legs and with wiping  Urine - had a lot of urinary incontinence with stressors with first pregnancy, does have urinary incontinence now with coughing, sneezing, laughing. feels like she isn't emptying, low urges in general needs a really full bladder. Doesn't wake up at night to urinate, and usually around 3-4 hours between. Does need to strain to empty bladder has urge (low) but has hesitation.   Bowels - no concerns   Pressure felt at vaginal with lifting, stairs  DRA SEPARATION - 1.5 finger width separation above umbilicus but no distortion does have pain  Sit up test 1/3 but limited with pain and does hold breath  Difficulty with core activation   No emotional/communication barriers or cognitive limitation. Patient is motivated to learn. Patient understands and agrees with treatment goals and plan. PT explains patient will be examined in standing, sitting, and lying down to see how their muscles and joints work. When they are ready, they will be asked to remove their underwear so PT can examine their perineum. The patient is also given the option of providing their own chaperone as one is not provided in our facility. The patient also has the right and is explained the right to defer or refuse any part of the evaluation or treatment including the internal exam. With the patient's consent, PT will use one gloved finger to gently assess the muscles of the pelvic floor, seeing how well it contracts and  relaxes and if there is muscle symmetry. After, the patient will get dressed and PT and patient will discuss exam findings and plan of care. PT and patient discuss plan of care, schedule, attendance policy and HEP activities.   3/5 strength, 7s, 5 reps vaginally   TREATMENT DATE:   11/11/2024: Nustep level 3 x6 min with PT present to discuss status Seated transversus abdominus contraction by pressing ball into thighs 2x10 Seated hip adduction with ball squeeze 2x10 Patient unable to lie flat, so utilized black wedge for all supine exercises (pt reported this felt better than fully supine) Supine hip ER/IR x20 Supine transversus abdominus red pball press into knees x10 Supine alt hand pressing into alt thigh with red pball x10 bilat Unable to go in quadruped due to pain Seated blue pball rollout x10 Seated on blue pball:  4 way pelvic tilt, CW/CCW .  X10 each direction Standing hip flexor stretch with foot on second step x10 sec bilat Seated diaphragmatic breathing x10    11/10/24: Re-eval completed with pt confirming 2 identifiers.  All pelvic floor questions completed and findings above in objective findings Patient consented to internal pelvic floor assessment vaginally this date and found to have decreased strength, endurance, and coordination. But contracting consistently. Does have tension in deep layers bil and lt superficially with TTP with tense areas. Pt also has TTP in Rt obturator, midline superficial and deep (worse in deep). Pt also have TTP at Lt superficial transverse perineal but tension bil and at perineal. Patient benefited from verbal cues for improved technique with pelvic floor contractions and coordination with  breathing with pelvic floor activation.    11/03/2024 Initial Evaluation & HEP created                                                                                                                              Eval limited due to patient being late to  appointment Use of using towel roll when sitting at work Benefits of pelvic floor therapy   PATIENT EDUCATION:  Education details: PT eval findings, anticipated POC, progress with PT, and initial HEP Person educated: Patient Education method: Explanation, Demonstration, and Handouts Education comprehension: verbalized understanding, returned demonstration, and needs further education  HOME EXERCISE PROGRAM: Access Code: [email or web address] URL: [email or web address] Date: 11/10/2024 Prepared by: Darryle   Exercises - Seated Abdominal Press into [employer]  - 1 x daily - 7 x weekly - 1-2 sets - 10 reps - 3-4 hold - Seated Hip Adduction Isometrics with Ball  - 1 x daily - 7 x weekly - 1-2 sets - 10 reps - Seated Quadratus Lumborum Stretch in Chair  - 1 x daily - 7 x weekly - 2 sets - 3-4 hold - Seated Diaphragmatic Breathing  - 1 x daily - 7 x weekly - 1 sets - 10 reps - Supine Hip Internal and External Rotation  - 1 x daily - 7 x weekly - 1 sets - 10 reps - Seated Cough with Pelvic Floor Contraction and Hand to Mouth  - 1 x daily - 7 x weekly - 1 sets - 10 reps  ASSESSMENT:  CLINICAL IMPRESSION:  Kura presents to skilled PT reporting that her pain has improved some since initial evaluation.  Patient able to progress with core strengthening exercises during session.  Unable to tolerate lying fully in supine, so utilized wedge for supine exercises and patient stated that felt better.  Patient unable to tolerate quadruped, so performed seated physioball rollout and patient reported decreased pain.  Patient is on the pelvic PT waiting list for another pelvic PT appointment.  Patient continues to require skilled PT to progress towards goal related activities.  OBJECTIVE IMPAIRMENTS: decreased mobility, difficulty walking, decreased ROM, decreased strength, increased muscle spasms, impaired flexibility, impaired sensation, postural dysfunction, and pain.   ACTIVITY LIMITATIONS: carrying,  lifting, bending, sitting, standing, squatting, sleeping, transfers, self feeding, hygiene/grooming, locomotion level, and caring for others  PARTICIPATION LIMITATIONS: meal prep, cleaning, laundry, driving, shopping, community activity, and occupation  PERSONAL FACTORS: Time since onset of injury/illness/exacerbation are also affecting patient's functional outcome.   REHAB POTENTIAL: Good  CLINICAL DECISION MAKING: Evolving/moderate complexity  EVALUATION COMPLEXITY: Moderate   GOALS: Goals reviewed with patient? Yes  SHORT TERM GOALS: Target date: 12/01/2024  Patient will be independent with initial HEP. Baseline:  Goal status: Ongoing  2.  Patient will report > or = to 30% improvement in back pain since starting PT. Baseline:  Goal status: Ongoing  3.  Patient to undergo evaluation by a pelvic floor therapist to establish baseline.  Baseline:  Goal status: met 11/20  4.  Patient will be able to > or = to 10 mins with < or = to 3/10 back pain to improve ability to care for her children and complete work duties. Baseline: 2 mins Goal status: INITIAL  5.  Patient will demonstrate proper lifting/ hinging technique to ensure safety when picking up her children. Baseline:  Goal status: INITIAL    LONG TERM GOALS: Target date: 12/29/2024 Patient will demonstrate independence in advanced HEP. Baseline:  Goal status: INITIAL  2.  Patient will report > or = to 70% improvement in back pain since starting PT. Baseline:  Goal status: INITIAL  3.  Patient will demonstrate correct TA activation and breathing technique while performing exercises with no cues. Baseline:  Goal status: INITIAL  4.  Patient will score < or = to 16/50 on ODI due to decreased self perceived disability and improved function. Baseline: 26/50 52% Goal status: INITIAL  5.  Patient will perform 5STS in < or = to 20 sec due to improved LE strength and functional mobility. Baseline: 26 sec Goal status:  INITIAL  6. Pt to demonstrate ability to complete x10 single leg sit to stands without compensation for improved stability with lifting children.  Baseline:  Goal status: new 11/20  7. Pt to demonstrate improved posture midline upright for at least 30 mins without compensation for decreased strain at pelvic floor and improve mechanics for prolonged standing.  Baseline:  Goal status: new 11/20  8. Pt to report ability to sit at least 30 mins to tolerate taking child to doctor's appointments.  Baseline:  Goal status: new 11/20  9. Pt to report no pain with vaginal penetration for improved tolerance to medical exams.   Baseline:  Goal status: new 11/20  PLAN:  PT FREQUENCY: 1-2x/week  PT DURATION: 8 weeks  PLANNED INTERVENTIONS: 97164- PT Re-evaluation, 97110-Therapeutic exercises, 97530- Therapeutic activity, 97112- Neuromuscular re-education, 97535- Self Care, 02859- Manual therapy, [phone]- Gait training, [phone]- Canalith repositioning, [id number]- Aquatic Therapy, [phone]- Electrical stimulation (unattended), [phone]- Electrical stimulation (manual), [id number]- Vasopneumatic device, [id number]- Ultrasound, [id number]- Traction (mechanical), [id number]- Ionotophoresis 4mg /ml Dexamethasone , 79439 (1-2 muscles), [id number] (3+ muscles)- Dry Needling, Patient/Family education, Balance training, Stair training, Taping, Joint mobilization, Joint manipulation, Spinal manipulation, Spinal mobilization, Vestibular training, Cryotherapy, and Moist heat.  PLAN FOR NEXT SESSION: Review HEP;  core strengthening (patient isn't comfortable laying supine); hinging technique; lumbar/hip mobility; mechanics for picking up children  Pelvic floor - hip IR strength and mobility, happy baby, childs pose, core strengthening, all exercises with exhale, manual for mid and low back, ribs, and diaphragm      Jarrell Laming, PT, DPT 11/11/24, 12:05 PM  [facility] [address] [place], [place]  [address] Phone # [phone] Fax [phone]

## 2024-11-16 ENCOUNTER — Encounter: Payer: Self-pay | Admitting: Rehabilitative and Restorative Service Providers"

## 2024-11-16 ENCOUNTER — Ambulatory Visit: Payer: Self-pay | Admitting: Rehabilitative and Restorative Service Providers"

## 2024-11-16 DIAGNOSIS — M6281 Muscle weakness (generalized): Secondary | ICD-10-CM

## 2024-11-16 DIAGNOSIS — M5459 Other low back pain: Secondary | ICD-10-CM

## 2024-11-16 DIAGNOSIS — R278 Other lack of coordination: Secondary | ICD-10-CM

## 2024-11-16 NOTE — Therapy (Signed)
 OUTPATIENT PHYSICAL THERAPY THORACOLUMBAR EVALUATION and pelvic floor assessment/treatment   Patient Name: Erica Hall MRN: [id number] DOB:12/23/1995, 28 y.o., female Today's Date: 11/16/2024  END OF SESSION:  PT End of Session - 11/16/24 0737     Visit Number 4    Date for Recertification  12/29/24    Authorization Type 100% Financial Assistance Letter 09/06/24 - 03/06/2024    PT Start Time 0736    PT Stop Time 0800    PT Time Calculation (min) 24 min    Activity Tolerance Patient limited by pain    Behavior During Therapy [facility] for tasks assessed/performed          Past Medical History:  Diagnosis Date   GERD (gastroesophageal reflux disease)    occasional -diet controlled, no meds   Gestational diabetes    with first preg   Headache    Molar pregnancy 2019   Supervision of high risk pregnancy, antepartum 01/27/2024              NURSING     PROVIDER      Office Location    [facility]    Dating by    lmp      [facility]    Traditional    Anatomy U/S    complete      Initiated care at     [employer] [facility]     English                     LAB RESULTS       Support Person    Not sure    Genetics    NIPS: LR F  AFP:                 NT/IT (FT only)                     Carrier Screen    H   Past Surgical History:  Procedure Laterality Date   DILATION AND EVACUATION N/A 12/30/2017   Procedure: DILATATION AND EVACUATION;  Surgeon: Nicholaus Burnard HERO, MD;  Location: [facility];  Service: Gynecology;  Laterality: N/A;   WISDOM TOOTH EXTRACTION     Patient Active Problem List   Diagnosis Date Noted   Gallstones 06/23/2024   Gestational diabetes 02/23/2024   History of molar pregnancy 09/10/2020    PCP: None  REFERRING PROVIDER: Herchel Gloris LABOR, MD  REFERRING DIAG:  Diagnosis  M54.40 (ICD-10-CM) - Back pain of lumbosacral region with sciatica    Rationale for Evaluation and Treatment: Rehabilitation  THERAPY DIAG:  Other low back  pain  Muscle weakness (generalized)  Other lack of coordination  ONSET DATE: September 05, 2024  SUBJECTIVE:  SUBJECTIVE STATEMENT: Patient reports feeling a little sore after last visit, but that overall, she is feeling better.  Is lactating.    PERTINENT HISTORY:  Patient is post-partum 08/19/2024 53 month old and a 27 year old  PAIN:  Are you having pain? Yes: NPRS scale: 4/10 Pain location: upper lumbar spine and radiates to lower lumber spine and bilateral hips Pain description: achy; sharp Aggravating factors: walking > 2 mins; standing > 10-15 mins;  Relieving factors: Nothing; she tries to change positions; heat/cold   PRECAUTIONS: None  RED FLAGS: None   WEIGHT BEARING RESTRICTIONS: No  FALLS:  Has patient fallen in last 6 months? 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  DIAGNOSTIC FINDINGS:  Lumbar MR 10/21/2024 SPINAL CORD: The conus terminates normally.   SOFT TISSUES: No acute abnormality.  IMPRESSION: 1. No acute findings.  PATIENT SURVEYS:  Modified Oswestry:   Interpretation of scores: Score Category Description  0-20% Minimal Disability The patient can cope with most living activities. Usually no treatment is indicated apart from advice on lifting, sitting and exercise  21-40% Moderate  Disability The patient experiences more pain and difficulty with sitting, lifting and standing. Travel and social life are more difficult and they may be disabled from work. Personal care, sexual activity and sleeping are not grossly affected, and the patient can usually be managed by conservative means  41-60% Severe Disability Pain remains the main problem in this group, but activities of daily living are affected. These patients require a detailed investigation  61-80% Crippled Back pain impinges on all aspects of the patient's life. Positive intervention is required  81-100% Bed-bound These patients are either bed-bound or exaggerating their symptoms  Erica Hall Erica Hall Erica Hall, et al. Surgery versus conservative management of stable thoracolumbar fracture: the PRESTO feasibility RCT. [place] ([place]): [employer]; 2021 Nov. [facility] Technology Assessment, No. 25.62.) Appendix 3, Oswestry Disability Index category descriptors. Available from: Findjewelers.cz  Minimally Clinically Important Difference (MCID) = 12.8%  COGNITION: Overall cognitive status: Within functional limits for tasks assessed     SENSATION: When she sits for > 10 mins she has numbness / tingling in her legs especially her left leg.    POSTURE: sit with a posterior pelvic tilt (provided education on correct seated posture)  PALPATION:   LUMBAR ROM: * pain  AROM eval  Flexion 70% limited*  Extension 80% limited  Right lateral flexion Bends past knee joint line*  Left lateral flexion Bends past knee joint line  Right rotation 50% limited  Left rotation 50% limited *   (Blank rows = not tested)   LOWER EXTREMITY MMT:    MMT Right eval Left eval  Hip flexion    Hip extension    Hip abduction    Hip adduction    Hip internal rotation    Hip external rotation    Knee flexion    Knee extension    Ankle dorsiflexion    Ankle plantarflexion    Ankle inversion     Ankle eversion     (Blank rows = not  tested)   FUNCTIONAL TESTS:   Eval:  5 times sit to stand: 26.95 sec with UE support (increased back pain)  PELVIC FLOOR: X2 vaginal births tearing with first (2nd deg), first deg with second baby - epidural with first not second but needed it inserting 2x Pain with intercourse deep, positions make it worse, does feel dry externally and internally.  Reports she has sensation change intermittently without pattern between legs and with wiping  Urine - had a lot of urinary incontinence with stressors with first pregnancy, does have urinary incontinence now with coughing, sneezing, laughing. feels like she isn't emptying, low urges in general needs a really full bladder. Doesn't wake up at night to urinate, and usually around 3-4 hours between. Does need to strain to empty bladder has urge (low) but has hesitation.   Bowels - no concerns   Pressure felt at vaginal with lifting, stairs  DRA SEPARATION - 1.5 finger width separation above umbilicus but no distortion does have pain  Sit up test 1/3 but limited with pain and does hold breath  Difficulty with core activation   No emotional/communication barriers or cognitive limitation. Patient is motivated to learn. Patient understands and agrees with treatment goals and plan. PT explains patient will be examined in standing, sitting, and lying down to see how their muscles and joints work. When they are ready, they will be asked to remove their underwear so PT can examine their perineum. The patient is also given the option of providing their own chaperone as one is not provided in our facility. The patient also has the right and is explained the right to defer or refuse any part of the evaluation or treatment including the internal exam. With the patient's consent, PT will use one gloved finger to gently assess the muscles of the pelvic floor, seeing how well it contracts and relaxes and if there is muscle  symmetry. After, the patient will get dressed and PT and patient will discuss exam findings and plan of care. PT and patient discuss plan of care, schedule, attendance policy and HEP activities.   3/5 strength, 7s, 5 reps vaginally   TREATMENT DATE:   11/16/2024: Nustep level 3 x6 min with PT present to discuss status Seated 3 way green pball rollout x10 each Seated lateral lean over blue peanut ball x10 bilat Seated on green pball:  4 way pelvic tilt, CW/CCW .  X10 each direction Seated on green pball performing diaphragmatic breathing x10 Seated hamstring stretch x20 sec bilat Educated about pec stretches and upper trap stretch due to breast feeding   11/11/2024: Nustep level 3 x6 min with PT present to discuss status Seated transversus abdominus contraction by pressing ball into thighs 2x10 Seated hip adduction with ball squeeze 2x10 Patient unable to lie flat, so utilized black wedge for all supine exercises (pt reported this felt better than fully supine) Supine hip ER/IR x20 Supine transversus abdominus red pball press into knees x10 Supine alt hand pressing into alt thigh with red pball x10 bilat Unable to go in quadruped due to pain Seated blue pball rollout x10 Seated on blue pball:  4 way pelvic tilt, CW/CCW .  X10 each direction Standing hip flexor stretch with foot on second step x10 sec bilat Seated diaphragmatic breathing x10    11/10/24: Re-eval completed with pt confirming 2 identifiers.  All pelvic floor questions completed and findings above in objective findings Patient consented to internal pelvic floor assessment vaginally this date and found to  have decreased strength, endurance, and coordination. But contracting consistently. Does have tension in deep layers bil and lt superficially with TTP with tense areas. Pt also has TTP in Rt obturator, midline superficial and deep (worse in deep). Pt also have TTP at Lt superficial transverse perineal but tension bil  and at perineal. Patient benefited from verbal cues for improved technique with pelvic floor contractions and coordination with breathing with pelvic floor activation.    PATIENT EDUCATION:  Education details: PT eval findings, anticipated POC, progress with PT, and initial HEP Person educated: Patient Education method: Explanation, Demonstration, and Handouts Education comprehension: verbalized understanding, returned demonstration, and needs further education  HOME EXERCISE PROGRAM: Access Code: [email or web address] URL: [email or web address] Date: 11/10/2024 Prepared by: Darryle   Exercises - Seated Abdominal Press into [employer]  - 1 x daily - 7 x weekly - 1-2 sets - 10 reps - 3-4 hold - Seated Hip Adduction Isometrics with Ball  - 1 x daily - 7 x weekly - 1-2 sets - 10 reps - Seated Quadratus Lumborum Stretch in Chair  - 1 x daily - 7 x weekly - 2 sets - 3-4 hold - Seated Diaphragmatic Breathing  - 1 x daily - 7 x weekly - 1 sets - 10 reps - Supine Hip Internal and External Rotation  - 1 x daily - 7 x weekly - 1 sets - 10 reps - Seated Cough with Pelvic Floor Contraction and Hand to Mouth  - 1 x daily - 7 x weekly - 1 sets - 10 reps  ASSESSMENT:  CLINICAL IMPRESSION:  Erica Hall presents to skilled PT reporting that she is having less pain today than last visit.  Patient reports that stretches seem to help her out a lot.  Patient continues to progress with pelvic tilts on physioball, stating that the smaller green pball seemed to be better than the larger blue one.  Patient continues to progress towards goal related activities.  OBJECTIVE IMPAIRMENTS: decreased mobility, difficulty walking, decreased ROM, decreased strength, increased muscle spasms, impaired flexibility, impaired sensation, postural dysfunction, and pain.   ACTIVITY LIMITATIONS: carrying, lifting, bending, sitting, standing, squatting, sleeping, transfers, self feeding, hygiene/grooming, locomotion level, and caring  for others  PARTICIPATION LIMITATIONS: meal prep, cleaning, laundry, driving, shopping, community activity, and occupation  PERSONAL FACTORS: Time since onset of injury/illness/exacerbation are also affecting patient's functional outcome.   REHAB POTENTIAL: Good  CLINICAL DECISION MAKING: Evolving/moderate complexity  EVALUATION COMPLEXITY: Moderate   GOALS: Goals reviewed with patient? Yes  SHORT TERM GOALS: Target date: 12/01/2024  Patient will be independent with initial HEP. Baseline:  Goal status: Ongoing  2.  Patient will report > or = to 30% improvement in back pain since starting PT. Baseline:  Goal status: Ongoing  3.  Patient to undergo evaluation by a pelvic floor therapist to establish baseline. Baseline:  Goal status: met 11/20  4.  Patient will be able to > or = to 10 mins with < or = to 3/10 back pain to improve ability to care for her children and complete work duties. Baseline: 2 mins Goal status: INITIAL  5.  Patient will demonstrate proper lifting/ hinging technique to ensure safety when picking up her children. Baseline:  Goal status: INITIAL    LONG TERM GOALS: Target date: 12/29/2024 Patient will demonstrate independence in advanced HEP. Baseline:  Goal status: INITIAL  2.  Patient will report > or = to 70% improvement in back pain since starting PT. Baseline:  Goal status: INITIAL  3.  Patient will demonstrate correct TA activation and breathing technique while performing exercises with no cues. Baseline:  Goal status: INITIAL  4.  Patient will score < or = to 16/50 on ODI due to decreased self perceived disability and improved function. Baseline: 26/50 52% Goal status: INITIAL  5.  Patient will perform 5STS in < or = to 20 sec due to improved LE strength and functional mobility. Baseline: 26 sec Goal status: INITIAL  6. Pt to demonstrate ability to complete x10 single leg sit to stands without compensation for improved stability with  lifting children.  Baseline:  Goal status: new 11/20  7. Pt to demonstrate improved posture midline upright for at least 30 mins without compensation for decreased strain at pelvic floor and improve mechanics for prolonged standing.  Baseline:  Goal status: new 11/20  8. Pt to report ability to sit at least 30 mins to tolerate taking child to doctor's appointments.  Baseline:  Goal status: new 11/20  9. Pt to report no pain with vaginal penetration for improved tolerance to medical exams.   Baseline:  Goal status: new 11/20  PLAN:  PT FREQUENCY: 1-2x/week  PT DURATION: 8 weeks  PLANNED INTERVENTIONS: 97164- PT Re-evaluation, 97110-Therapeutic exercises, 97530- Therapeutic activity, 97112- Neuromuscular re-education, 97535- Self Care, [id number]- Manual therapy, [phone]- Gait training, [phone]- Canalith repositioning, [id number]- Aquatic Therapy, [phone]- Electrical stimulation (unattended), [phone]- Electrical stimulation (manual), [id number]- Vasopneumatic device, [id number]- Ultrasound, [id number]- Traction (mechanical), [id number]- Ionotophoresis 4mg /ml Dexamethasone , 79439 (1-2 muscles), [id number] (3+ muscles)- Dry Needling, Patient/Family education, Balance training, Stair training, Taping, Joint mobilization, Joint manipulation, Spinal manipulation, Spinal mobilization, Vestibular training, Cryotherapy, and Moist heat.  PLAN FOR NEXT SESSION: Review HEP;  core strengthening (patient isn't comfortable laying supine); hinging technique; lumbar/hip mobility; mechanics for picking up children  Pelvic floor - hip IR strength and mobility, happy baby, childs pose, core strengthening, all exercises with exhale, manual for mid and low back, ribs, and diaphragm      Jarrell Laming, PT, DPT 11/16/24, 9:04 AM  [facility] [address] [place], [place] [address] Phone # [phone] Fax [phone]

## 2024-11-21 NOTE — Addendum Note (Signed)
 Addended by: HERCHEL GRUMET A on: 11/21/2024 10:38 AM   Modules accepted: Orders

## 2024-11-23 ENCOUNTER — Ambulatory Visit: Payer: Self-pay | Admitting: Physical Therapy

## 2024-11-23 DIAGNOSIS — R278 Other lack of coordination: Secondary | ICD-10-CM | POA: Insufficient documentation

## 2024-11-23 DIAGNOSIS — M6281 Muscle weakness (generalized): Secondary | ICD-10-CM | POA: Insufficient documentation

## 2024-11-23 DIAGNOSIS — M5459 Other low back pain: Secondary | ICD-10-CM | POA: Insufficient documentation

## 2024-11-23 NOTE — Therapy (Signed)
 OUTPATIENT PHYSICAL THERAPY THORACOLUMBAR EVALUATION and pelvic floor assessment/treatment   Patient Name: Erica Hall MRN: [id number] DOB:Nov 27, 1996, 28 y.o., female Today's Date: 11/23/2024  END OF SESSION:  PT End of Session - 11/23/24 0809     Visit Number 5    Date for Recertification  12/29/24    Authorization Type 100% Financial Assistance Letter 09/06/24 - 03/06/2024    PT Start Time 0807   arrival   PT Stop Time 0849    PT Time Calculation (min) 42 min    Activity Tolerance Patient limited by pain    Behavior During Therapy [facility] for tasks assessed/performed           Past Medical History:  Diagnosis Date   GERD (gastroesophageal reflux disease)    occasional -diet controlled, no meds   Gestational diabetes    with first preg   Headache    Molar pregnancy 2019   Supervision of high risk pregnancy, antepartum 01/27/2024              NURSING     PROVIDER      Office Location    [facility]    Dating by    lmp      [facility]    Traditional    Anatomy U/S    complete      Initiated care at     [employer]     English                     LAB RESULTS       Support Person    Not sure    Genetics    NIPS: LR F  AFP:                 NT/IT (FT only)                     Carrier Screen    H   Past Surgical History:  Procedure Laterality Date   DILATION AND EVACUATION N/A 12/30/2017   Procedure: DILATATION AND EVACUATION;  Surgeon: Nicholaus Burnard HERO, MD;  Location: [facility];  Service: Gynecology;  Laterality: N/A;   WISDOM TOOTH EXTRACTION     Patient Active Problem List   Diagnosis Date Noted   Gallstones 06/23/2024   Gestational diabetes 02/23/2024   History of molar pregnancy 09/10/2020    PCP: None  REFERRING PROVIDER: Herchel Gloris LABOR, MD  REFERRING DIAG:  Diagnosis  M54.40 (ICD-10-CM) - Back pain of lumbosacral region with sciatica    Rationale for Evaluation and Treatment: Rehabilitation  THERAPY DIAG:  Other low  back pain  Muscle weakness (generalized)  Other lack of coordination  ONSET DATE: September 05, 2024  SUBJECTIVE:  SUBJECTIVE STATEMENT: Reports she does feel like things are getting better, now able to get and down much easier, in/out of the bed easier, carrying kids is easier but all still has pain just lower.  Urinary emptying has been better no straining with urinating now, is going around the 3 hour mark now. Pressure still with lifting and stairs, dryness still present.   Does report Lt leg pain with tingling with prolonged sitting then transferring to standing and reports she does feel like that is getting worse.   Is lactating.    PERTINENT HISTORY:  Patient is post-partum 08/19/2024 51 month old and a 27 year old  PAIN:  Are you having pain? Yes: NPRS scale: 4/10 Pain location: upper lumbar spine and radiates to lower lumber spine and bilateral hips Pain description: achy; sharp Aggravating factors: walking > 2 mins; standing > 10-15 mins;  Relieving factors: Nothing; she tries to change positions; heat/cold   PRECAUTIONS: None  RED FLAGS: None   WEIGHT BEARING RESTRICTIONS: No  FALLS:  Has patient fallen in last 6 months? 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  DIAGNOSTIC FINDINGS:  Lumbar MR  10/21/2024 SPINAL CORD: The conus terminates normally.   SOFT TISSUES: No acute abnormality.  IMPRESSION: 1. No acute findings.  PATIENT SURVEYS:  Modified Oswestry:   Interpretation of scores: Score Category Description  0-20% Minimal Disability The patient can cope with most living activities. Usually no treatment is indicated apart from advice on lifting, sitting and exercise  21-40% Moderate Disability The patient experiences more pain and difficulty with sitting, lifting and standing. Travel and social life are more difficult and they may be disabled from work. Personal care, sexual activity and sleeping are not grossly affected, and the patient can usually be managed by conservative means  41-60% Severe Disability Pain remains the main problem in this group, but activities of daily living are affected. These patients require a detailed investigation  61-80% Crippled Back pain impinges on all aspects of the patient's life. Positive intervention is required  81-100% Bed-bound These patients are either bed-bound or exaggerating their symptoms  Bluford FORBES Zoe DELENA Karon DELENA, et al. Surgery versus conservative management of stable thoracolumbar fracture: the PRESTO feasibility RCT. [place] ([place]): [employer]; 2021 Nov. [facility] Technology Assessment, No. 25.62.) Appendix 3, Oswestry Disability Index category descriptors. Available from: Findjewelers.cz  Minimally Clinically Important Difference (MCID) = 12.8%  COGNITION: Overall cognitive status: Within functional limits for tasks assessed     SENSATION: When she sits for > 10 mins she has numbness / tingling in her legs especially her left leg.    POSTURE: sit with a posterior pelvic tilt (provided education on correct seated posture)  PALPATION:   LUMBAR ROM: * pain  AROM eval  Flexion 70% limited*  Extension 80% limited  Right lateral flexion Bends past knee joint line*  Left  lateral flexion Bends past knee joint line  Right rotation 50% limited  Left rotation 50% limited *   (Blank rows = not tested)   LOWER EXTREMITY MMT:    MMT  Right eval Left eval  Hip flexion    Hip extension    Hip abduction    Hip adduction    Hip internal rotation    Hip external rotation    Knee flexion    Knee extension    Ankle dorsiflexion    Ankle plantarflexion    Ankle inversion    Ankle eversion     (Blank rows = not tested)   FUNCTIONAL TESTS:   Eval:  5 times sit to stand: 26.95 sec with UE support (increased back pain)  PELVIC FLOOR: X2 vaginal births tearing with first (2nd deg), first deg with second baby - epidural with first not second but needed it inserting 2x Pain with intercourse deep, positions make it worse, does feel dry externally and internally.  Reports she has sensation change intermittently without pattern between legs and with wiping  Urine - had a lot of urinary incontinence with stressors with first pregnancy, does have urinary incontinence now with coughing, sneezing, laughing. feels like she isn't emptying, low urges in general needs a really full bladder. Doesn't wake up at night to urinate, and usually around 3-4 hours between. Does need to strain to empty bladder has urge (low) but has hesitation.   Bowels - no concerns   Pressure felt at vaginal with lifting, stairs  DRA SEPARATION - 1.5 finger width separation above umbilicus but no distortion does have pain  Sit up test 1/3 but limited with pain and does hold breath  Difficulty with core activation   No emotional/communication barriers or cognitive limitation. Patient is motivated to learn. Patient understands and agrees with treatment goals and plan. PT explains patient will be examined in standing, sitting, and lying down to see how their muscles and joints work. When they are ready, they will be asked to remove their underwear so PT can examine their perineum. The patient is  also given the option of providing their own chaperone as one is not provided in our facility. The patient also has the right and is explained the right to defer or refuse any part of the evaluation or treatment including the internal exam. With the patient's consent, PT will use one gloved finger to gently assess the muscles of the pelvic floor, seeing how well it contracts and relaxes and if there is muscle symmetry. After, the patient will get dressed and PT and patient will discuss exam findings and plan of care. PT and patient discuss plan of care, schedule, attendance policy and HEP activities.   3/5 strength, 7s, 5 reps vaginally   TREATMENT DATE:   11/23/24: Pt had questions about sciatica and shown/educated on anatomy of this and nerve - pt reported better understanding of this and reports she does have a referral for orthopedic and waiting to hear from them as well Seated lumbar roll outs blue ball x10 forward, x5 lt and right Seated piriformis stretch 3x30s each 10# seated dead lift with emphasis on exhale and pelvic floor activation 2x10 sit to stands with 10# exhale and pelvic floor activation Standing alt marching 10# suitcase hold x15 with dumb bell each side Hooklying transverse abdominis activation with ball squeeze with pt having great difficulty - max cues and techniques with mild improvement noted  Sidelying ball press with hip abduction x10 each side (did report after this pain increased to 5/10 from 3/10) End of session moist heat pack at low back in sitting for 5  mins with pt educated on coordination of pelvic floor and  breathing and attempting to exhale with all lifting/pushing/pulling at home for decreased pressure and pain - removed at end of session no adverse reaction post removal and x6 layers between skin and pack  11/16/2024: Nustep level 3 x6 min with PT present to discuss status Seated 3 way green pball rollout x10 each Seated lateral lean over blue peanut ball  x10 bilat Seated on green pball:  4 way pelvic tilt, CW/CCW .  X10 each direction Seated on green pball performing diaphragmatic breathing x10 Seated hamstring stretch x20 sec bilat Educated about pec stretches and upper trap stretch due to breast feeding   11/11/2024: Nustep level 3 x6 min with PT present to discuss status Seated transversus abdominus contraction by pressing ball into thighs 2x10 Seated hip adduction with ball squeeze 2x10 Patient unable to lie flat, so utilized black wedge for all supine exercises (pt reported this felt better than fully supine) Supine hip ER/IR x20 Supine transversus abdominus red pball press into knees x10 Supine alt hand pressing into alt thigh with red pball x10 bilat Unable to go in quadruped due to pain Seated blue pball rollout x10 Seated on blue pball:  4 way pelvic tilt, CW/CCW .  X10 each direction Standing hip flexor stretch with foot on second step x10 sec bilat Seated diaphragmatic breathing x10    11/10/24: Re-eval completed with pt confirming 2 identifiers.  All pelvic floor questions completed and findings above in objective findings Patient consented to internal pelvic floor assessment vaginally this date and found to have decreased strength, endurance, and coordination. But contracting consistently. Does have tension in deep layers bil and lt superficially with TTP with tense areas. Pt also has TTP in Rt obturator, midline superficial and deep (worse in deep). Pt also have TTP at Lt superficial transverse perineal but tension bil and at perineal. Patient benefited from verbal cues for improved technique with pelvic floor contractions and coordination with breathing with pelvic floor activation.    PATIENT EDUCATION:  Education details: PT eval findings, anticipated POC, progress with PT, and initial HEP Person educated: Patient Education method: Explanation, Demonstration, and Handouts Education comprehension: verbalized  understanding, returned demonstration, and needs further education  HOME EXERCISE PROGRAM: Access Code: [email or web address] URL: [email or web address] Date: 11/10/2024 Prepared by: Darryle   Exercises - Seated Abdominal Press into [employer]  - 1 x daily - 7 x weekly - 1-2 sets - 10 reps - 3-4 hold - Seated Hip Adduction Isometrics with Ball  - 1 x daily - 7 x weekly - 1-2 sets - 10 reps - Seated Quadratus Lumborum Stretch in Chair  - 1 x daily - 7 x weekly - 2 sets - 3-4 hold - Seated Diaphragmatic Breathing  - 1 x daily - 7 x weekly - 1 sets - 10 reps - Supine Hip Internal and External Rotation  - 1 x daily - 7 x weekly - 1 sets - 10 reps - Seated Cough with Pelvic Floor Contraction and Hand to Mouth  - 1 x daily - 7 x weekly - 1 sets - 10 reps  ASSESSMENT:  CLINICAL IMPRESSION:  Wrenly presents to skilled PT reporting that she is having less pain today than last visit. Continues to report improvement with HEP and overall, did have worsening with pain with mat exercises but returned to 3/10 with moist heat at end of session. Pt does demonstrate poor transverse abdominis activation with all attempts and holding breath throughout session, was able to tolerate sit to stands (which usually  causes pain) without pain today with 10# with consistent cues for exhale to stand. Patient continues to progress towards goal related activities.  OBJECTIVE IMPAIRMENTS: decreased mobility, difficulty walking, decreased ROM, decreased strength, increased muscle spasms, impaired flexibility, impaired sensation, postural dysfunction, and pain.   ACTIVITY LIMITATIONS: carrying, lifting, bending, sitting, standing, squatting, sleeping, transfers, self feeding, hygiene/grooming, locomotion level, and caring for others  PARTICIPATION LIMITATIONS: meal prep, cleaning, laundry, driving, shopping, community activity, and occupation  PERSONAL FACTORS: Time since onset of injury/illness/exacerbation are also  affecting patient's functional outcome.   REHAB POTENTIAL: Good  CLINICAL DECISION MAKING: Evolving/moderate complexity  EVALUATION COMPLEXITY: Moderate   GOALS: Goals reviewed with patient? Yes  SHORT TERM GOALS: Target date: 12/01/2024  Patient will be independent with initial HEP. Baseline:  Goal status: Ongoing  2.  Patient will report > or = to 30% improvement in back pain since starting PT. Baseline:  Goal status: Ongoing  3.  Patient to undergo evaluation by a pelvic floor therapist to establish baseline. Baseline:  Goal status: met 11/20  4.  Patient will be able to > or = to 10 mins with < or = to 3/10 back pain to improve ability to care for her children and complete work duties. Baseline: 2 mins Goal status: INITIAL  5.  Patient will demonstrate proper lifting/ hinging technique to ensure safety when picking up her children. Baseline:  Goal status: INITIAL    LONG TERM GOALS: Target date: 12/29/2024 Patient will demonstrate independence in advanced HEP. Baseline:  Goal status: INITIAL  2.  Patient will report > or = to 70% improvement in back pain since starting PT. Baseline:  Goal status: INITIAL  3.  Patient will demonstrate correct TA activation and breathing technique while performing exercises with no cues. Baseline:  Goal status: INITIAL  4.  Patient will score < or = to 16/50 on ODI due to decreased self perceived disability and improved function. Baseline: 26/50 52% Goal status: INITIAL  5.  Patient will perform 5STS in < or = to 20 sec due to improved LE strength and functional mobility. Baseline: 26 sec Goal status: INITIAL  6. Pt to demonstrate ability to complete x10 single leg sit to stands without compensation for improved stability with lifting children.  Baseline:  Goal status: new 11/20  7. Pt to demonstrate improved posture midline upright for at least 30 mins without compensation for decreased strain at pelvic floor and improve  mechanics for prolonged standing.  Baseline:  Goal status: new 11/20  8. Pt to report ability to sit at least 30 mins to tolerate taking child to doctor's appointments.  Baseline:  Goal status: new 11/20  9. Pt to report no pain with vaginal penetration for improved tolerance to medical exams.   Baseline:  Goal status: new 11/20  PLAN:  PT FREQUENCY: 1-2x/week  PT DURATION: 8 weeks  PLANNED INTERVENTIONS: 97164- PT Re-evaluation, 97110-Therapeutic exercises, 97530- Therapeutic activity, 97112- Neuromuscular re-education, 97535- Self Care, [id number]- Manual therapy, [phone]- Gait training, [phone]- Canalith repositioning, [id number]- Aquatic Therapy, [phone]- Electrical stimulation (unattended), [phone]- Electrical stimulation (manual), [id number]- Vasopneumatic device, [id number]- Ultrasound, [id number]- Traction (mechanical), [id number]- Ionotophoresis 4mg /ml Dexamethasone , [id number] (1-2 muscles), [id number] (3+ muscles)- Dry Needling, Patient/Family education, Balance training, Stair training, Taping, Joint mobilization, Joint manipulation, Spinal manipulation, Spinal mobilization, Vestibular training, Cryotherapy, and Moist heat.  PLAN FOR NEXT SESSION: Review HEP;  core strengthening (patient isn't comfortable laying supine); hinging technique; lumbar/hip mobility; mechanics for picking up children  Pelvic floor - hip IR  strength and mobility, happy baby, childs pose, core strengthening, all exercises with exhale, manual for mid and low back, ribs, and diaphragm      Darryle Navy, PT, DPT 12/03/259:05 AM  [facility] [address] [place], [place] [address] Phone # [phone] Fax [phone]

## 2024-11-24 ENCOUNTER — Ambulatory Visit: Payer: Self-pay | Admitting: Physical Therapy

## 2024-12-01 ENCOUNTER — Ambulatory Visit: Payer: Self-pay | Admitting: Physical Therapy

## 2024-12-02 ENCOUNTER — Ambulatory Visit: Payer: Self-pay | Admitting: Physical Therapy

## 2024-12-08 ENCOUNTER — Ambulatory Visit: Payer: Self-pay | Admitting: Physical Therapy

## 2024-12-09 ENCOUNTER — Ambulatory Visit: Payer: Self-pay | Admitting: Physical Therapy

## 2024-12-23 ENCOUNTER — Ambulatory Visit: Payer: Self-pay | Attending: Obstetrics & Gynecology | Admitting: Physical Therapy

## 2024-12-23 ENCOUNTER — Encounter: Payer: Self-pay | Admitting: Physical Therapy

## 2024-12-23 DIAGNOSIS — R278 Other lack of coordination: Secondary | ICD-10-CM | POA: Insufficient documentation

## 2024-12-23 DIAGNOSIS — M6281 Muscle weakness (generalized): Secondary | ICD-10-CM | POA: Insufficient documentation

## 2024-12-23 DIAGNOSIS — M5459 Other low back pain: Secondary | ICD-10-CM | POA: Insufficient documentation

## 2024-12-23 NOTE — Therapy (Addendum)
 " OUTPATIENT PHYSICAL THERAPY THORACOLUMBAR TREATMENT and pelvic floor assessment/treatment/ RE-CERTIFICATION/ DISCHARGE NOTE   Patient Name: Erica Hall MRN: [id number] DOB:Oct 16, 1996, 29 y.o., female Today's Date: 12/23/2024  END OF SESSION:  PT End of Session - 12/23/24 1101     Visit Number 6    Date for Recertification  02/03/25    Authorization Type 100% Financial Assistance Letter 09/06/24 - 03/06/2024    PT Start Time 1023    PT Stop Time 1102    PT Time Calculation (min) 39 min    Activity Tolerance Patient tolerated treatment well    Behavior During Therapy WFL for tasks assessed/performed            Past Medical History:  Diagnosis Date   GERD (gastroesophageal reflux disease)    occasional -diet controlled, no meds   Gestational diabetes    with first preg   Headache    Molar pregnancy 2019   Supervision of high risk pregnancy, antepartum 01/27/2024              NURSING     PROVIDER      Office Location    [facility]    Dating by    lmp      [facility] Model    Traditional    Anatomy U/S    complete      Initiated care at     [employer]     English                     LAB RESULTS       Support Person    Not sure    Genetics    NIPS: LR F  AFP:                 NT/IT (FT only)                     Carrier Screen    H   Past Surgical History:  Procedure Laterality Date   DILATION AND EVACUATION N/A 12/30/2017   Procedure: DILATATION AND EVACUATION;  Surgeon: Nicholaus Burnard HERO, MD;  Location: [facility];  Service: Gynecology;  Laterality: N/A;   WISDOM TOOTH EXTRACTION     Patient Active Problem List   Diagnosis Date Noted   Gallstones 06/23/2024   Gestational diabetes 02/23/2024   History of molar pregnancy 09/10/2020    PCP: None  REFERRING PROVIDER: Herchel Gloris LABOR, MD  REFERRING DIAG:  Diagnosis  M54.40 (ICD-10-CM) - Back pain of lumbosacral region with sciatica    Rationale for Evaluation and Treatment:  Rehabilitation  THERAPY DIAG:  Other low back pain  Muscle weakness (generalized)  Other lack of coordination  ONSET DATE: September 05, 2024  SUBJECTIVE:  SUBJECTIVE STATEMENT: Patient reports her back pain has been doing a lot better. Pain today is 4/10. She has been compliant with HEP. Her work schedule has been crazy and that is why she has been cancelling.   Does report Lt leg pain with tingling with prolonged sitting then transferring to standing and reports she does feel like that is getting worse.   Is lactating.    PERTINENT HISTORY:  Patient is post-partum 08/19/2024 103 month old and a 29 year old  PAIN:  Are you having pain? Yes: NPRS scale: 4/10 Pain location: upper lumbar spine and radiates to lower lumber spine and bilateral hips Pain description: achy; sharp Aggravating factors: walking > 2 mins; standing > 10-15 mins;  Relieving factors: Nothing; she tries to change positions; heat/cold   PRECAUTIONS: None  RED FLAGS: None   WEIGHT BEARING RESTRICTIONS: No  FALLS:  Has patient fallen in last 6 months? 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  DIAGNOSTIC FINDINGS:  Lumbar MR 10/21/2024 SPINAL CORD: The conus terminates normally.   SOFT TISSUES: No acute abnormality.  IMPRESSION: 1. No acute  findings.  PATIENT SURVEYS:  12/23/2024 Modified Oswestry: 16/50 32%  Interpretation of scores: Score Category Description  0-20% Minimal Disability The patient can cope with most living activities. Usually no treatment is indicated apart from advice on lifting, sitting and exercise  21-40% Moderate Disability The patient experiences more pain and difficulty with sitting, lifting and standing. Travel and social life are more difficult and they may be disabled from work. Personal care, sexual activity and sleeping are not grossly affected, and the patient can usually be managed by conservative means  41-60% Severe Disability Pain remains the main problem in this group, but activities of daily living are affected. These patients require a detailed investigation  61-80% Crippled Back pain impinges on all aspects of the patients life. Positive intervention is required  81-100% Bed-bound These patients are either bed-bound or exaggerating their symptoms  Bluford FORBES Zoe DELENA Karon DELENA, et al. Surgery versus conservative management of stable thoracolumbar fracture: the PRESTO feasibility RCT. [place] ([place]): [employer]; 2021 Nov. [facility] Technology Assessment, No. 25.62.) Appendix 3, Oswestry Disability Index category descriptors. Available from: Findjewelers.cz  Minimally Clinically Important Difference (MCID) = 12.8%  COGNITION: Overall cognitive status: Within functional limits for tasks assessed     SENSATION: When she sits for > 10 mins she has numbness / tingling in her legs especially her left leg.    POSTURE: sit with a posterior pelvic tilt (provided education on correct seated posture)  PALPATION:   LUMBAR ROM: * pain  AROM eval 12/23/2024  Flexion 70% limited*   Extension 80% limited   Right lateral flexion Bends past knee joint line*   Left lateral flexion Bends past knee joint line   Right rotation 50% limited   Left rotation 50%  limited *    (Blank rows = not tested)   LOWER EXTREMITY MMT:    MMT Right eval Left eval  Hip flexion    Hip extension    Hip abduction  Hip adduction    Hip internal rotation    Hip external rotation    Knee flexion    Knee extension    Ankle dorsiflexion    Ankle plantarflexion    Ankle inversion    Ankle eversion     (Blank rows = not tested)   FUNCTIONAL TESTS:   Eval:  5 times sit to stand: 26.95 sec with UE support (increased back pain)  12/23/2024 5 STS: 15.72 sec (hands on knees; still back pain)  PELVIC FLOOR: X2 vaginal births tearing with first (2nd deg), first deg with second baby - epidural with first not second but needed it inserting 2x Pain with intercourse deep, positions make it worse, does feel dry externally and internally.  Reports she has sensation change intermittently without pattern between legs and with wiping  Urine - had a lot of urinary incontinence with stressors with first pregnancy, does have urinary incontinence now with coughing, sneezing, laughing. feels like she isn't emptying, low urges in general needs a really full bladder. Doesn't wake up at night to urinate, and usually around 3-4 hours between. Does need to strain to empty bladder has urge (low) but has hesitation.   Bowels - no concerns   Pressure felt at vaginal with lifting, stairs  DRA SEPARATION - 1.5 finger width separation above umbilicus but no distortion does have pain  Sit up test 1/3 but limited with pain and does hold breath  Difficulty with core activation   No emotional/communication barriers or cognitive limitation. Patient is motivated to learn. Patient understands and agrees with treatment goals and plan. PT explains patient will be examined in standing, sitting, and lying down to see how their muscles and joints work. When they are ready, they will be asked to remove their underwear so PT can examine their perineum. The patient is also given the option of  providing their own chaperone as one is not provided in our facility. The patient also has the right and is explained the right to defer or refuse any part of the evaluation or treatment including the internal exam. With the patient's consent, PT will use one gloved finger to gently assess the muscles of the pelvic floor, seeing how well it contracts and relaxes and if there is muscle symmetry. After, the patient will get dressed and PT and patient will discuss exam findings and plan of care. PT and patient discuss plan of care, schedule, attendance policy and HEP activities.   3/5 strength, 7s, 5 reps vaginally   TREATMENT DATE:  12/23/2024 NuStep Level 5 6 mins- PT present to discuss status 5STS, ODI, Goal Assessment and plan for next 6 weeks Seated lumbar roll outs blue ball x10 forward, x5 lt and right Seated piriformis stretch 2x30s each Supine hip internal external rotation x 8 each direction 5 sec hold SL clamshell x 12 bilateral    11/23/24: Pt had questions about sciatica and shown/educated on anatomy of this and nerve - pt reported better understanding of this and reports she does have a referral for orthopedic and waiting to hear from them as well Seated lumbar roll outs blue ball x10 forward, x5 lt and right Seated piriformis stretch 3x30s each 10# seated dead lift with emphasis on exhale and pelvic floor activation 2x10 sit to stands with 10# exhale and pelvic floor activation Standing alt marching 10# suitcase hold x15 with dumb bell each side Hooklying transverse abdominis activation with ball squeeze with pt having great difficulty - max cues and  techniques with mild improvement noted  Sidelying ball press with hip abduction x10 each side (did report after this pain increased to 5/10 from 3/10) End of session moist heat pack at low back in sitting for 5  mins with pt educated on coordination of pelvic floor and breathing and attempting to exhale with all lifting/pushing/pulling at  home for decreased pressure and pain - removed at end of session no adverse reaction post removal and x6 layers between skin and pack  11/16/2024: Nustep level 3 x6 min with PT present to discuss status Seated 3 way green pball rollout x10 each Seated lateral lean over blue peanut ball x10 bilat Seated on green pball:  4 way pelvic tilt, CW/CCW .  X10 each direction Seated on green pball performing diaphragmatic breathing x10 Seated hamstring stretch x20 sec bilat Educated about pec stretches and upper trap stretch due to breast feeding   11/11/2024: Nustep level 3 x6 min with PT present to discuss status Seated transversus abdominus contraction by pressing ball into thighs 2x10 Seated hip adduction with ball squeeze 2x10 Patient unable to lie flat, so utilized black wedge for all supine exercises (pt reported this felt better than fully supine) Supine hip ER/IR x20 Supine transversus abdominus red pball press into knees x10 Supine alt hand pressing into alt thigh with red pball x10 bilat Unable to go in quadruped due to pain Seated blue pball rollout x10 Seated on blue pball:  4 way pelvic tilt, CW/CCW .  X10 each direction Standing hip flexor stretch with foot on second step x10 sec bilat Seated diaphragmatic breathing x10     PATIENT EDUCATION:  Education details: PT eval findings, anticipated POC, progress with PT, and initial HEP Person educated: Patient Education method: Explanation, Demonstration, and Handouts Education comprehension: verbalized understanding, returned demonstration, and needs further education  HOME EXERCISE PROGRAM: Access Code: [email or web address] URL: [email or web address] Date: 11/10/2024 Prepared by: Darryle   Exercises - Seated Abdominal Press into [employer]  - 1 x daily - 7 x weekly - 1-2 sets - 10 reps - 3-4 hold - Seated Hip Adduction Isometrics with Ball  - 1 x daily - 7 x weekly - 1-2 sets - 10 reps - Seated Quadratus Lumborum Stretch  in Chair  - 1 x daily - 7 x weekly - 2 sets - 3-4 hold - Seated Diaphragmatic Breathing  - 1 x daily - 7 x weekly - 1 sets - 10 reps - Supine Hip Internal and External Rotation  - 1 x daily - 7 x weekly - 1 sets - 10 reps - Seated Cough with Pelvic Floor Contraction and Hand to Mouth  - 1 x daily - 7 x weekly - 1 sets - 10 reps  ASSESSMENT:  CLINICAL IMPRESSION: Skyllar presents to skilled therapy with a small lapse in treatment due to her work schedule. She has been complaint with HEP and feels the exercises has been helping. She has not had 10/10 back pain recently and she has been staying around the 0-5 range. She can walk short distances with no limitations. When walking up inclines and stairs she feels back pain, and with prolonged sitting > 30 mins. When doing repetitive bending and lifting she still experiences back pain. Plan to review bending and lifting mechanics in subsequent sessions. All short term goals are met and patient is progressing well towards long term goals. Patient would benefit from continued therapy to meet remaining goals, review lifting/ bending mechanics, and core strengthening.   OBJECTIVE  IMPAIRMENTS: decreased mobility, difficulty walking, decreased ROM, decreased strength, increased muscle spasms, impaired flexibility, impaired sensation, postural dysfunction, and pain.   ACTIVITY LIMITATIONS: carrying, lifting, bending, sitting, standing, squatting, sleeping, transfers, self feeding, hygiene/grooming, locomotion level, and caring for others  PARTICIPATION LIMITATIONS: meal prep, cleaning, laundry, driving, shopping, community activity, and occupation  PERSONAL FACTORS: Time since onset of injury/illness/exacerbation are also affecting patient's functional outcome.   REHAB POTENTIAL: Good  CLINICAL DECISION MAKING: Evolving/moderate complexity  EVALUATION COMPLEXITY: Moderate   GOALS: Goals reviewed with patient? Yes  SHORT TERM GOALS: Target date:  12/01/2024  Patient will be independent with initial HEP. Baseline:  Goal status: MET  2.  Patient will report > or = to 30% improvement in back pain since starting PT. Baseline:  Goal status: met 12/23/2024  3.  Patient to undergo evaluation by a pelvic floor therapist to establish baseline. Baseline:  Goal status: met 11/20  4.  Patient will be able to walk  > or = to 10 mins with < or = to 3/10 back pain to improve ability to care for her children and complete work duties. Baseline: 2 mins Goal status: MET 12/23/2024  5.  Patient will demonstrate proper lifting/ hinging technique to ensure safety when picking up her children. Baseline:  Goal status:in progress 12/23/2024    LONG TERM GOALS: Target date: 02/03/2025 Patient will demonstrate independence in advanced HEP. Baseline:  Goal status: In progress 12/23/2024  2.  Patient will report > or = to 70% improvement in back pain since starting PT. Baseline:  Goal status: MET 12/23/2024  3.  Patient will demonstrate correct TA activation and breathing technique while performing exercises with no cues. Baseline:  Goal status: IN PROGRESS (getting better) 12/23/2024  4.  Patient will score < or = to 16/50 on ODI due to decreased self perceived disability and improved function. Baseline: 26/50 52% Goal status: met 12/23/2024  5.  Patient will perform 5STS in < or = to 20 sec due to improved LE strength and functional mobility. Baseline: 26 sec Goal status: MET 12/23/2024  6. Pt to demonstrate ability to complete x10 single leg sit to stands without compensation for improved stability with lifting children.  Baseline:  Goal status: IN PROGRESS 12/23/2024  7. Pt to demonstrate improved posture midline upright for at least 30 mins without compensation for decreased strain at pelvic floor and improve mechanics for prolonged standing.  Baseline:  Goal status: MET 12/23/2024  8. Pt to report ability to sit at least 40 mins -1 hour to tolerate  taking child to doctor's appointments.  Baseline:  Goal status: IN PROGRESS 12/23/2024  9. Pt to report no pain with vaginal penetration for improved tolerance to medical exams.   Baseline:  Goal status: in progress 12/23/2024  PLAN:  PT FREQUENCY: 1-2x/week  PT DURATION: 6 weeks  PLANNED INTERVENTIONS: 97164- PT Re-evaluation, 97110-Therapeutic exercises, 97530- Therapeutic activity, 97112- Neuromuscular re-education, 97535- Self Care, 02859- Manual therapy, [phone]- Gait training, [phone]- Canalith repositioning, [id number]- Aquatic Therapy, [phone]- Electrical stimulation (unattended), [phone]- Electrical stimulation (manual), [id number]- Vasopneumatic device, [id number]- Ultrasound, [id number]- Traction (mechanical), [id number]- Ionotophoresis 4mg /ml Dexamethasone , 79439 (1-2 muscles), 20561 (3+ muscles)- Dry Needling, Patient/Family education, Balance training, Stair training, Taping, Joint mobilization, Joint manipulation, Spinal manipulation, Spinal mobilization, Vestibular training, Cryotherapy, and Moist heat.  PLAN FOR NEXT SESSION: lifting/bending technique core strengthening (patient isn't comfortable laying supine); hinging technique; lumbar/hip mobility; mechanics for picking up children  Pelvic floor - hip IR strength and mobility, happy baby, childs  pose, core strengthening, all exercises with exhale, manual for mid and low back, ribs, and diaphragm       Kristeen Sar, PT, DPT 12/23/2024 11:03 AM [facility] [address], [address] [place], [place] [address] Phone # [phone] Fax [phone]    PHYSICAL THERAPY DISCHARGE SUMMARY  Visits from Start of Care: 6  Current functional level related to goals / functional outcomes: Patient requested discharge due to change in work schedule. She will continue HEP at home.    Patient agrees to discharge. Patient goals were partially met. Patient is being discharged due to the patient's request.    "

## 2024-12-27 ENCOUNTER — Ambulatory Visit: Payer: Self-pay | Admitting: Physical Therapy

## 2024-12-29 ENCOUNTER — Ambulatory Visit: Payer: Self-pay | Admitting: Physical Therapy

## 2025-01-05 ENCOUNTER — Encounter: Payer: Self-pay | Admitting: Physical Therapy

## 2025-01-05 ENCOUNTER — Ambulatory Visit: Payer: Self-pay

## 2025-01-06 ENCOUNTER — Ambulatory Visit: Payer: Self-pay | Admitting: Rehabilitative and Restorative Service Providers"

## 2025-01-12 ENCOUNTER — Ambulatory Visit: Payer: Self-pay | Admitting: Physical Therapy

## 2025-01-19 ENCOUNTER — Ambulatory Visit: Payer: Self-pay | Admitting: Physical Therapy

## 2025-01-20 ENCOUNTER — Encounter: Payer: Self-pay | Admitting: Surgery

## 2025-01-20 ENCOUNTER — Ambulatory Visit: Payer: Self-pay | Admitting: Surgery

## 2025-01-20 VITALS — BP 119/76 | HR 83 | Temp 98.4°F | Ht 60.0 in | Wt 175.6 lb

## 2025-01-20 DIAGNOSIS — K8020 Calculus of gallbladder without cholecystitis without obstruction: Secondary | ICD-10-CM

## 2025-01-20 NOTE — Progress Notes (Signed)
 " 01/20/2025  History of Present Illness: Erica Hall is a 29 y.o. female presenting for follow up cholelithiasis and GERD. She was last seen on 10/07/24 at which time we started her on a trial of Prilosec to see if her symptoms are more related to GERD or cholelithiasis.  The patient has been taking this and she reports that there has been some improvement but she still having episodes about once a week of epigastric and right upper quadrant pain.  She also notices that she has been experiencing more diarrhea.  The patient denies any severe episodes but does note that the episodes happen more after eating greasy foods.  Past Medical History: Past Medical History:  Diagnosis Date   GERD (gastroesophageal reflux disease)    occasional -diet controlled, no meds   Gestational diabetes    with first preg   Headache    Molar pregnancy 2019   Supervision of high risk pregnancy, antepartum 01/27/2024              NURSING     PROVIDER      [facility]    Dating by    lmp      [facility] Model    Traditional    Anatomy U/S    complete      Initiated care at     [employer]     English                     LAB RESULTS       Support Person    Not sure    Genetics    NIPS: LR F  AFP:                 NT/IT (FT only)                     Carrier Screen    H     Past Surgical History: Past Surgical History:  Procedure Laterality Date   DILATION AND EVACUATION N/A 12/30/2017   Procedure: DILATATION AND EVACUATION;  Surgeon: Nicholaus Burnard HERO, MD;  Location: [facility];  Service: Gynecology;  Laterality: N/A;   WISDOM TOOTH EXTRACTION      Home Medications: Prior to Admission medications  Medication Sig Start Date End Date Taking? Authorizing Provider  cyclobenzaprine  (FLEXERIL ) 10 MG tablet Take 1 tablet (10 mg total) by mouth every 8 (eight) hours as needed for muscle spasms. 10/06/24  Yes Anyanwu, Ugonna A, MD  ibuprofen  (ADVIL ) 800 MG tablet Take 1  tablet (800 mg total) by mouth 3 (three) times daily with meals as needed for headache, moderate pain (pain score 4-6) or cramping. 10/06/24  Yes Anyanwu, Ugonna A, MD  Prenatal Vit-Fe Fumarate-FA (PRENATAL PLUS VITAMIN/MINERAL) 27-1 MG TABS Take 1 tablet by mouth daily. 12/25/23  Yes Warren-Hill, Camie LABOR, CNM    Allergies: Allergies[1]  Review of Systems: Review of Systems  Constitutional:  Negative for chills and fever.  Respiratory:  Negative for shortness of breath.   Cardiovascular:  Negative for chest pain.  Gastrointestinal:  Positive for abdominal pain. Negative for nausea and vomiting.    Physical Exam BP 119/76   Pulse 83   Temp 98.4 F (36.9 C) (Oral)   Ht 5' (1.524 m)   Wt 175  lb 9.6 oz (79.7 kg)   SpO2 98%   BMI 34.29 kg/m  CONSTITUTIONAL: No acute distress, well-nourished HEENT:  Normocephalic, atraumatic, extraocular motion intact. RESPIRATORY:  Lungs are clear, and breath sounds are equal bilaterally. Normal respiratory effort without pathologic use of accessory muscles. CARDIOVASCULAR: Heart is regular without murmurs, gallops, or rubs. GI: The abdomen is soft, nondistended, with some discomfort to palpation in the right upper quadrant.  Negative Murphy's sign.  NEUROLOGIC:  Motor and sensation is grossly normal.  Cranial nerves are grossly intact. PSYCH:  Alert and oriented to person, place and time. Affect is normal.  Labs/Imaging: Ultrasound RUQ on 06/03/2024: IMPRESSION: Cholecystolithiasis. No changes of acute cholecystitis.  Assessment and Plan: This is a 29 y.o. female with symptomatic cholelithiasis.  -- Discussed with patient that now after the trial of PPI, given that she still having symptoms and are related to greasy foods, I do think that her cholelithiasis is playing a role in her abdominal pain.  I think her GERD did play a role as well but this has improved after taking PPI.  With this in mind though, I think she would benefit from  cholecystectomy.  The patient is in agreement with this, but she is concerned about the possibility of side effects after surgery particularly diarrhea which she has heard relatives/friends have had after surgery.  Discussed with her that after gallbladder surgery sometimes people do have loose stools but usually this improves as the body adjusts to having a gallbladder.  However there is a possibility of persistent diarrhea in that case there are medications that can be given to help with this. - Discussed with patient and the plan for robotic assisted cholecystectomy and reviewed the surgery at length with her including the planned incisions, risks of bleeding, infection, injury to surrounding structures, the use of ICG to better evaluate the biliary anatomy, that this would be an outpatient procedure, postoperative pain control, activity restrictions, and she is willing to proceed. - The patient will check with his family given that she has 2 young kids to see when would be the best timing for her to have more help at the house.  Once she figures this out, she will call us  to schedule surgery.  All of her questions have been answered.  I spent 30 minutes dedicated to the care of this patient on the date of this encounter to include pre-visit review of records, face-to-face time with the patient discussing diagnosis and management, and any post-visit coordination of care.   Aloysius Sheree Plant, MD [facility]         [1] No Known Allergies  "

## 2025-01-20 NOTE — Patient Instructions (Signed)
 You have requested to have your gallbladder removed. This will be done at [facility] with Dr. Desiderio.   If you are on any injectable weight loss medication, you will need to stop taking your GLP-1 injectable (weight loss) medications 8 days before your surgery to avoid any complications with anesthesia.    You will most likely be out of work 1-2 weeks for this surgery.  If you have FMLA or disability paperwork that needs filled out you may drop this off at our office or this can be faxed to [phone].   You will return after your post-op appointment with a lifting restriction for approximately 4 more weeks.   You will be able to eat anything you would like to following surgery. But, start by eating a bland diet and advance this as tolerated. The Gallbladder diet is below, please go as closely by this diet as possible prior to surgery to avoid any further attacks.   Please see the (blue)pre-care form that you have been given today. You will receive several phone calls from [facility] prior to your surgery date.   If you have any questions, please call our office.   Laparoscopic Cholecystectomy Laparoscopic cholecystectomy is surgery to remove the gallbladder. The gallbladder is located in the upper right part of the abdomen, behind the liver. It is a storage sac for bile, which is produced in the liver. Bile aids in the digestion and absorption of fats. Cholecystectomy is often done for inflammation of the gallbladder (cholecystitis). This condition is usually caused by a buildup of gallstones (cholelithiasis) in the gallbladder. Gallstones can block the flow of bile, and that can result in inflammation and pain. In severe cases, emergency surgery may be required. If emergency surgery is not required, you will have time to prepare for the procedure. Laparoscopic surgery is an alternative to open surgery. Laparoscopic surgery has a shorter recovery time. Your common bile duct may also  need to be examined during the procedure. If stones are found in the common bile duct, they may be removed. LET [facility] CARE PROVIDER KNOW ABOUT: Any allergies you have. All medicines you are taking, including vitamins, herbs, eye drops, creams, and over-the-counter medicines. Previous problems you or members of your family have had with the use of anesthetics. Any blood disorders you have. Previous surgeries you have had.  Any medical conditions you have. RISKS AND COMPLICATIONS Generally, this is a safe procedure. However, problems may occur, including: Infection. Bleeding. Allergic reactions to medicines. Damage to other structures or organs. A stone remaining in the common bile duct. A bile leak from the cyst duct that is clipped when your gallbladder is removed. The need to convert to open surgery, which requires a larger incision in the abdomen. This may be necessary if your surgeon thinks that it is not safe to continue with a laparoscopic procedure. BEFORE THE PROCEDURE Ask your health care provider about: Changing or stopping your regular medicines. This is especially important if you are taking diabetes medicines or blood thinners. Taking medicines such as aspirin and ibuprofen . These medicines can thin your blood. Do not take these medicines before your procedure if your health care provider instructs you not to. Follow instructions from your health care provider about eating or drinking restrictions. Let your health care provider know if you develop a cold or an infection before surgery. Plan to have someone take you home after the procedure. Ask your health care provider how your surgical site will be  marked or identified. You may be given antibiotic medicine to help prevent infection. PROCEDURE To reduce your risk of infection: Your health care team will wash or sanitize their hands. Your skin will be washed with soap. An IV tube may be inserted into one of your  veins. You will be given a medicine to make you fall asleep (general anesthetic). A breathing tube will be placed in your mouth. The surgeon will make several small cuts (incisions) in your abdomen. A thin, lighted tube (laparoscope) that has a tiny camera on the end will be inserted through one of the small incisions. The camera on the laparoscope will send a picture to a TV screen (monitor) in the operating room. This will give the surgeon a good view inside your abdomen. A gas will be pumped into your abdomen. This will expand your abdomen to give the surgeon more room to perform the surgery. Other tools that are needed for the procedure will be inserted through the other incisions. The gallbladder will be removed through one of the incisions. After your gallbladder has been removed, the incisions will be closed with stitches (sutures), staples, or skin glue. Your incisions may be covered with a bandage (dressing). The procedure may vary among health care providers and hospitals. AFTER THE PROCEDURE Your blood pressure, heart rate, breathing rate, and blood oxygen level will be monitored often until the medicines you were given have worn off. You will be given medicines as needed to control your pain.   This information is not intended to replace advice given to you by your health care provider. Make sure you discuss any questions you have with your health care provider.   Document Released: 12/08/2005 Document Revised: 08/29/2015 Document Reviewed: 07/20/2013 Elsevier Interactive Patient Education 2016 [employer]     Low-Fat Diet for Gallbladder Conditions A low-fat diet can be helpful if you have pancreatitis or a gallbladder condition. With these conditions, your pancreas and gallbladder have trouble digesting fats. A healthy eating plan with less fat will help rest your pancreas and gallbladder and reduce your symptoms. WHAT DO I NEED TO KNOW ABOUT THIS DIET? Eat a low-fat  diet. Reduce your fat intake to less than 20-30% of your total daily calories. This is less than 50-60 g of fat per day. Remember that you need some fat in your diet. Ask your dietician what your daily goal should be. Choose nonfat and low-fat healthy foods. Look for the words nonfat, low fat, or fat free. As a guide, look on the label and choose foods with less than 3 g of fat per serving. Eat only one serving. Avoid alcohol. Do not smoke. If you need help quitting, talk with your health care provider. Eat small frequent meals instead of three large heavy meals. WHAT FOODS CAN I EAT? Grains Include healthy grains and starches such as potatoes, wheat bread, fiber-rich cereal, and brown rice. Choose whole grain options whenever possible. In adults, whole grains should account for 45-65% of your daily calories.  Fruits and Vegetables Eat plenty of fruits and vegetables. Fresh fruits and vegetables add fiber to your diet. Meats and Other Protein Sources Eat lean meat such as chicken and pork. Trim any fat off of meat before cooking it. Eggs, fish, and beans are other sources of protein. In adults, these foods should account for 10-35% of your daily calories. Dairy Choose low-fat milk and dairy options. Dairy includes fat and protein, as well as calcium.  Fats and Oils Limit high-fat  foods such as fried foods, sweets, baked goods, sugary drinks.  Other Creamy sauces and condiments, such as mayonnaise, can add extra fat. Think about whether or not you need to use them, or use smaller amounts or low fat options. WHAT FOODS ARE NOT RECOMMENDED? High fat foods, such as: [employer]. Ice cream. French toast. Sweet rolls. Pizza. Cheese bread. Foods covered with batter, butter, creamy sauces, or cheese. Fried foods. Sugary drinks and desserts. Foods that cause gas or bloating   This information is not intended to replace advice given to you by your health care provider. Make sure you  discuss any questions you have with your health care provider.   Document Released: 12/13/2013 Document Reviewed: 12/13/2013 Elsevier Interactive Patient Education [employer]

## 2025-01-27 ENCOUNTER — Ambulatory Visit: Payer: Self-pay | Admitting: Physical Therapy

## 2025-02-02 ENCOUNTER — Ambulatory Visit: Payer: Self-pay | Admitting: Physical Therapy
